# Patient Record
Sex: Female | Born: 1960 | Race: White | Hispanic: No | Smoking: Former smoker
Health system: Southern US, Community
[De-identification: ages and names within clinical notes are randomized; demographics above are authoritative.]

## PROBLEM LIST (undated history)

## (undated) DIAGNOSIS — G709 Myoneural disorder, unspecified: Secondary | ICD-10-CM

## (undated) DIAGNOSIS — M51369 Other intervertebral disc degeneration, lumbar region without mention of lumbar back pain or lower extremity pain: Secondary | ICD-10-CM

## (undated) DIAGNOSIS — T7840XA Allergy, unspecified, initial encounter: Secondary | ICD-10-CM

## (undated) DIAGNOSIS — E079 Disorder of thyroid, unspecified: Secondary | ICD-10-CM

## (undated) DIAGNOSIS — F1910 Other psychoactive substance abuse, uncomplicated: Secondary | ICD-10-CM

## (undated) DIAGNOSIS — G5600 Carpal tunnel syndrome, unspecified upper limb: Secondary | ICD-10-CM

## (undated) DIAGNOSIS — D259 Leiomyoma of uterus, unspecified: Secondary | ICD-10-CM

## (undated) DIAGNOSIS — F329 Major depressive disorder, single episode, unspecified: Secondary | ICD-10-CM

## (undated) DIAGNOSIS — T148XXA Other injury of unspecified body region, initial encounter: Secondary | ICD-10-CM

## (undated) DIAGNOSIS — E785 Hyperlipidemia, unspecified: Secondary | ICD-10-CM

## (undated) DIAGNOSIS — M5136 Other intervertebral disc degeneration, lumbar region: Secondary | ICD-10-CM

## (undated) DIAGNOSIS — F419 Anxiety disorder, unspecified: Secondary | ICD-10-CM

## (undated) DIAGNOSIS — M1990 Unspecified osteoarthritis, unspecified site: Secondary | ICD-10-CM

## (undated) DIAGNOSIS — F32A Depression, unspecified: Secondary | ICD-10-CM

## (undated) DIAGNOSIS — M797 Fibromyalgia: Secondary | ICD-10-CM

## (undated) DIAGNOSIS — J45909 Unspecified asthma, uncomplicated: Secondary | ICD-10-CM

## (undated) HISTORY — DX: Allergy, unspecified, initial encounter: T78.40XA

## (undated) HISTORY — DX: Disorder of thyroid, unspecified: E07.9

## (undated) HISTORY — DX: Other intervertebral disc degeneration, lumbar region without mention of lumbar back pain or lower extremity pain: M51.369

## (undated) HISTORY — DX: Other psychoactive substance abuse, uncomplicated: F19.10

## (undated) HISTORY — PX: TONSILLECTOMY: SUR1361

## (undated) HISTORY — DX: Unspecified osteoarthritis, unspecified site: M19.90

## (undated) HISTORY — PX: ANTERIOR CERVICAL DECOMP/DISCECTOMY FUSION: SHX1161

## (undated) HISTORY — DX: Other injury of unspecified body region, initial encounter: T14.8XXA

## (undated) HISTORY — DX: Major depressive disorder, single episode, unspecified: F32.9

## (undated) HISTORY — DX: Anxiety disorder, unspecified: F41.9

## (undated) HISTORY — DX: Depression, unspecified: F32.A

## (undated) HISTORY — DX: Myoneural disorder, unspecified: G70.9

## (undated) HISTORY — DX: Carpal tunnel syndrome, unspecified upper limb: G56.00

## (undated) HISTORY — DX: Hyperlipidemia, unspecified: E78.5

## (undated) HISTORY — DX: Fibromyalgia: M79.7

## (undated) HISTORY — DX: Unspecified asthma, uncomplicated: J45.909

## (undated) HISTORY — DX: Other intervertebral disc degeneration, lumbar region: M51.36

## (undated) HISTORY — DX: Leiomyoma of uterus, unspecified: D25.9

## (undated) HISTORY — PX: SHOULDER ARTHROSCOPY WITH ROTATOR CUFF REPAIR AND SUBACROMIAL DECOMPRESSION: SHX5686

---

## 1999-06-25 ENCOUNTER — Encounter: Admission: RE | Admit: 1999-06-25 | Discharge: 1999-06-25 | Payer: Self-pay | Admitting: Internal Medicine

## 2000-04-11 ENCOUNTER — Encounter: Admission: RE | Admit: 2000-04-11 | Discharge: 2000-05-27 | Payer: Self-pay | Admitting: Anesthesiology

## 2000-10-04 ENCOUNTER — Encounter: Payer: Self-pay | Admitting: Neurosurgery

## 2000-10-04 ENCOUNTER — Ambulatory Visit (HOSPITAL_COMMUNITY): Admission: RE | Admit: 2000-10-04 | Discharge: 2000-10-04 | Payer: Self-pay | Admitting: Neurosurgery

## 2000-10-20 ENCOUNTER — Encounter: Admission: RE | Admit: 2000-10-20 | Discharge: 2001-01-18 | Payer: Self-pay | Admitting: Anesthesiology

## 2001-02-03 ENCOUNTER — Encounter: Payer: Self-pay | Admitting: Family Medicine

## 2001-02-03 ENCOUNTER — Encounter: Admission: RE | Admit: 2001-02-03 | Discharge: 2001-02-03 | Payer: Self-pay | Admitting: Family Medicine

## 2001-11-02 ENCOUNTER — Other Ambulatory Visit: Admission: RE | Admit: 2001-11-02 | Discharge: 2001-11-02 | Payer: Self-pay | Admitting: Obstetrics and Gynecology

## 2002-11-10 ENCOUNTER — Emergency Department (HOSPITAL_COMMUNITY): Admission: EM | Admit: 2002-11-10 | Discharge: 2002-11-10 | Payer: Self-pay | Admitting: Internal Medicine

## 2003-01-02 ENCOUNTER — Encounter: Payer: Self-pay | Admitting: Neurosurgery

## 2003-01-04 ENCOUNTER — Encounter: Payer: Self-pay | Admitting: Neurosurgery

## 2003-01-04 ENCOUNTER — Inpatient Hospital Stay (HOSPITAL_COMMUNITY): Admission: RE | Admit: 2003-01-04 | Discharge: 2003-01-07 | Payer: Self-pay | Admitting: Neurosurgery

## 2003-01-23 ENCOUNTER — Encounter (HOSPITAL_COMMUNITY): Admission: RE | Admit: 2003-01-23 | Discharge: 2003-02-22 | Payer: Self-pay | Admitting: Neurosurgery

## 2003-02-21 ENCOUNTER — Other Ambulatory Visit: Admission: RE | Admit: 2003-02-21 | Discharge: 2003-02-21 | Payer: Self-pay | Admitting: Gynecology

## 2003-02-21 ENCOUNTER — Encounter: Payer: Self-pay | Admitting: Family Medicine

## 2003-02-21 ENCOUNTER — Encounter: Admission: RE | Admit: 2003-02-21 | Discharge: 2003-02-21 | Payer: Self-pay | Admitting: Family Medicine

## 2003-02-25 ENCOUNTER — Encounter: Admission: RE | Admit: 2003-02-25 | Discharge: 2003-02-25 | Payer: Self-pay | Admitting: Neurosurgery

## 2003-02-25 ENCOUNTER — Encounter: Payer: Self-pay | Admitting: Neurosurgery

## 2003-04-15 ENCOUNTER — Encounter: Payer: Self-pay | Admitting: Physical Medicine and Rehabilitation

## 2003-04-15 ENCOUNTER — Ambulatory Visit (HOSPITAL_COMMUNITY)
Admission: RE | Admit: 2003-04-15 | Discharge: 2003-04-15 | Payer: Self-pay | Admitting: Physical Medicine and Rehabilitation

## 2003-04-29 ENCOUNTER — Encounter: Payer: Self-pay | Admitting: Neurosurgery

## 2003-04-29 ENCOUNTER — Encounter: Admission: RE | Admit: 2003-04-29 | Discharge: 2003-04-29 | Payer: Self-pay | Admitting: Neurosurgery

## 2003-08-20 ENCOUNTER — Encounter: Admission: RE | Admit: 2003-08-20 | Discharge: 2003-08-20 | Payer: Self-pay | Admitting: Neurosurgery

## 2004-02-03 ENCOUNTER — Encounter: Admission: RE | Admit: 2004-02-03 | Discharge: 2004-02-03 | Payer: Self-pay | Admitting: Neurosurgery

## 2004-02-24 ENCOUNTER — Other Ambulatory Visit: Admission: RE | Admit: 2004-02-24 | Discharge: 2004-02-24 | Payer: Self-pay | Admitting: Gynecology

## 2004-05-12 ENCOUNTER — Encounter: Admission: RE | Admit: 2004-05-12 | Discharge: 2004-05-12 | Payer: Self-pay | Admitting: Neurosurgery

## 2004-09-11 ENCOUNTER — Encounter: Admission: RE | Admit: 2004-09-11 | Discharge: 2004-09-11 | Payer: Self-pay | Admitting: Family Medicine

## 2004-09-16 ENCOUNTER — Encounter: Admission: RE | Admit: 2004-09-16 | Discharge: 2004-09-16 | Payer: Self-pay | Admitting: Gynecology

## 2005-05-20 ENCOUNTER — Other Ambulatory Visit: Admission: RE | Admit: 2005-05-20 | Discharge: 2005-05-20 | Payer: Self-pay | Admitting: Gynecology

## 2005-12-19 ENCOUNTER — Encounter (INDEPENDENT_AMBULATORY_CARE_PROVIDER_SITE_OTHER): Payer: Self-pay | Admitting: Family Medicine

## 2005-12-20 ENCOUNTER — Ambulatory Visit: Payer: Self-pay | Admitting: Family Medicine

## 2006-02-15 ENCOUNTER — Ambulatory Visit: Payer: Self-pay | Admitting: Family Medicine

## 2006-02-18 ENCOUNTER — Ambulatory Visit: Payer: Self-pay | Admitting: Family Medicine

## 2006-04-06 ENCOUNTER — Ambulatory Visit: Payer: Self-pay | Admitting: Family Medicine

## 2006-04-06 ENCOUNTER — Encounter (INDEPENDENT_AMBULATORY_CARE_PROVIDER_SITE_OTHER): Payer: Self-pay | Admitting: Family Medicine

## 2006-10-17 ENCOUNTER — Encounter: Payer: Self-pay | Admitting: Family Medicine

## 2006-10-17 DIAGNOSIS — D259 Leiomyoma of uterus, unspecified: Secondary | ICD-10-CM

## 2006-10-17 DIAGNOSIS — IMO0001 Reserved for inherently not codable concepts without codable children: Secondary | ICD-10-CM | POA: Insufficient documentation

## 2006-10-17 DIAGNOSIS — K5900 Constipation, unspecified: Secondary | ICD-10-CM | POA: Insufficient documentation

## 2006-10-17 DIAGNOSIS — F411 Generalized anxiety disorder: Secondary | ICD-10-CM | POA: Insufficient documentation

## 2006-10-17 DIAGNOSIS — E785 Hyperlipidemia, unspecified: Secondary | ICD-10-CM | POA: Insufficient documentation

## 2006-10-17 DIAGNOSIS — M545 Low back pain, unspecified: Secondary | ICD-10-CM | POA: Insufficient documentation

## 2006-10-17 DIAGNOSIS — F329 Major depressive disorder, single episode, unspecified: Secondary | ICD-10-CM | POA: Insufficient documentation

## 2006-10-17 HISTORY — DX: Leiomyoma of uterus, unspecified: D25.9

## 2006-11-01 ENCOUNTER — Ambulatory Visit: Payer: Self-pay | Admitting: Family Medicine

## 2006-11-01 ENCOUNTER — Other Ambulatory Visit: Admission: RE | Admit: 2006-11-01 | Discharge: 2006-11-01 | Payer: Self-pay | Admitting: Family Medicine

## 2006-11-01 ENCOUNTER — Encounter (INDEPENDENT_AMBULATORY_CARE_PROVIDER_SITE_OTHER): Payer: Self-pay | Admitting: Family Medicine

## 2006-11-01 DIAGNOSIS — E669 Obesity, unspecified: Secondary | ICD-10-CM | POA: Insufficient documentation

## 2006-11-03 ENCOUNTER — Encounter (INDEPENDENT_AMBULATORY_CARE_PROVIDER_SITE_OTHER): Payer: Self-pay | Admitting: Family Medicine

## 2006-11-06 LAB — CONVERTED CEMR LAB
ALT: 31 units/L (ref 0–35)
AST: 22 units/L (ref 0–37)
Albumin: 4 g/dL (ref 3.5–5.2)
Alkaline Phosphatase: 78 units/L (ref 39–117)
BUN: 16 mg/dL (ref 6–23)
Basophils Absolute: 0 10*3/uL (ref 0.0–0.1)
Basophils Relative: 1 % (ref 0–1)
CO2: 26 meq/L (ref 19–32)
Calcium: 8.8 mg/dL (ref 8.4–10.5)
Chloride: 104 meq/L (ref 96–112)
Cholesterol: 196 mg/dL (ref 0–200)
Creatinine, Ser: 0.76 mg/dL (ref 0.40–1.20)
Eosinophils Absolute: 0.2 10*3/uL (ref 0.0–0.7)
Eosinophils Relative: 4 % (ref 0–5)
Glucose, Bld: 89 mg/dL (ref 70–99)
HCT: 44 % (ref 36.0–46.0)
HDL: 32 mg/dL — ABNORMAL LOW (ref >39)
Hemoglobin: 14 g/dL (ref 12.0–15.0)
LDL Cholesterol: 128 mg/dL — ABNORMAL HIGH (ref 0–99)
Lymphocytes Relative: 37 % (ref 12–46)
Lymphs Abs: 2.1 10*3/uL (ref 0.7–3.3)
MCHC: 31.8 g/dL (ref 30.0–36.0)
MCV: 93 fL (ref 78.0–100.0)
Monocytes Absolute: 0.4 10*3/uL (ref 0.2–0.7)
Monocytes Relative: 6 % (ref 3–11)
Neutro Abs: 3 10*3/uL (ref 1.7–7.7)
Neutrophils Relative %: 53 % (ref 43–77)
Platelets: 250 10*3/uL (ref 150–400)
Potassium: 4.6 meq/L (ref 3.5–5.3)
RBC: 4.73 M/uL (ref 3.87–5.11)
RDW: 12.8 % (ref 11.5–14.0)
Sodium: 139 meq/L (ref 135–145)
TSH: 7.553 microintl units/mL — ABNORMAL HIGH (ref 0.350–5.50)
Total Bilirubin: 0.4 mg/dL (ref 0.3–1.2)
Total CHOL/HDL Ratio: 6.1
Total Protein: 6.5 g/dL (ref 6.0–8.3)
Triglycerides: 181 mg/dL — ABNORMAL HIGH (ref <150)
VLDL: 36 mg/dL (ref 0–40)
WBC: 5.7 10*3/uL (ref 4.0–10.5)

## 2006-11-18 ENCOUNTER — Encounter: Admission: RE | Admit: 2006-11-18 | Discharge: 2006-11-18 | Payer: Self-pay | Admitting: Family Medicine

## 2006-12-01 ENCOUNTER — Ambulatory Visit: Payer: Self-pay | Admitting: Family Medicine

## 2006-12-01 LAB — CONVERTED CEMR LAB
Cholesterol, target level: 200 mg/dL
HDL goal, serum: 40 mg/dL
LDL Goal: 160 mg/dL

## 2006-12-02 ENCOUNTER — Telehealth (INDEPENDENT_AMBULATORY_CARE_PROVIDER_SITE_OTHER): Payer: Self-pay | Admitting: Family Medicine

## 2006-12-02 ENCOUNTER — Encounter (INDEPENDENT_AMBULATORY_CARE_PROVIDER_SITE_OTHER): Payer: Self-pay | Admitting: Family Medicine

## 2006-12-05 LAB — CONVERTED CEMR LAB: TSH: 4.465 microintl units/mL (ref 0.350–5.50)

## 2007-01-26 ENCOUNTER — Ambulatory Visit: Payer: Self-pay | Admitting: Family Medicine

## 2007-01-26 DIAGNOSIS — F17200 Nicotine dependence, unspecified, uncomplicated: Secondary | ICD-10-CM | POA: Insufficient documentation

## 2007-01-31 ENCOUNTER — Telehealth (INDEPENDENT_AMBULATORY_CARE_PROVIDER_SITE_OTHER): Payer: Self-pay | Admitting: Family Medicine

## 2007-03-13 ENCOUNTER — Ambulatory Visit: Payer: Self-pay | Admitting: Family Medicine

## 2007-03-22 ENCOUNTER — Telehealth (INDEPENDENT_AMBULATORY_CARE_PROVIDER_SITE_OTHER): Payer: Self-pay | Admitting: Family Medicine

## 2007-04-11 ENCOUNTER — Telehealth (INDEPENDENT_AMBULATORY_CARE_PROVIDER_SITE_OTHER): Payer: Self-pay | Admitting: *Deleted

## 2007-04-11 ENCOUNTER — Ambulatory Visit (HOSPITAL_COMMUNITY): Admission: RE | Admit: 2007-04-11 | Discharge: 2007-04-11 | Payer: Self-pay | Admitting: Family Medicine

## 2007-04-11 ENCOUNTER — Encounter (INDEPENDENT_AMBULATORY_CARE_PROVIDER_SITE_OTHER): Payer: Self-pay | Admitting: Family Medicine

## 2007-04-12 ENCOUNTER — Telehealth (INDEPENDENT_AMBULATORY_CARE_PROVIDER_SITE_OTHER): Payer: Self-pay | Admitting: *Deleted

## 2007-04-12 ENCOUNTER — Encounter (INDEPENDENT_AMBULATORY_CARE_PROVIDER_SITE_OTHER): Payer: Self-pay | Admitting: Family Medicine

## 2007-04-12 LAB — CONVERTED CEMR LAB
Basophils Absolute: 0 10*3/uL (ref 0.0–0.1)
Basophils Relative: 1 % (ref 0–1)
Eosinophils Absolute: 0.3 10*3/uL (ref 0.0–0.7)
Eosinophils Relative: 5 % (ref 0–5)
FSH: 4.3 milliintl units/mL
HCT: 42.3 % (ref 36.0–46.0)
Hemoglobin: 13.6 g/dL (ref 12.0–15.0)
LH: 0.3 milliintl units/mL
Lymphocytes Relative: 47 % — ABNORMAL HIGH (ref 12–46)
Lymphs Abs: 2.7 10*3/uL (ref 0.7–3.3)
MCHC: 32.2 g/dL (ref 30.0–36.0)
MCV: 91.4 fL (ref 78.0–100.0)
Monocytes Absolute: 0.4 10*3/uL (ref 0.2–0.7)
Monocytes Relative: 7 % (ref 3–11)
Neutro Abs: 2.4 10*3/uL (ref 1.7–7.7)
Neutrophils Relative %: 42 % — ABNORMAL LOW (ref 43–77)
Platelets: 258 10*3/uL (ref 150–400)
RBC: 4.63 M/uL (ref 3.87–5.11)
RDW: 13.9 % (ref 11.5–14.0)
TSH: 8.306 microintl units/mL — ABNORMAL HIGH (ref 0.350–5.50)
WBC: 5.7 10*3/uL (ref 4.0–10.5)

## 2007-06-06 ENCOUNTER — Ambulatory Visit: Payer: Self-pay | Admitting: Family Medicine

## 2007-06-07 ENCOUNTER — Telehealth (INDEPENDENT_AMBULATORY_CARE_PROVIDER_SITE_OTHER): Payer: Self-pay | Admitting: *Deleted

## 2007-06-26 ENCOUNTER — Telehealth (INDEPENDENT_AMBULATORY_CARE_PROVIDER_SITE_OTHER): Payer: Self-pay | Admitting: *Deleted

## 2007-06-27 ENCOUNTER — Encounter (INDEPENDENT_AMBULATORY_CARE_PROVIDER_SITE_OTHER): Payer: Self-pay | Admitting: Family Medicine

## 2007-07-03 ENCOUNTER — Encounter (INDEPENDENT_AMBULATORY_CARE_PROVIDER_SITE_OTHER): Payer: Self-pay | Admitting: Family Medicine

## 2007-07-04 ENCOUNTER — Encounter (INDEPENDENT_AMBULATORY_CARE_PROVIDER_SITE_OTHER): Payer: Self-pay | Admitting: Family Medicine

## 2007-07-20 ENCOUNTER — Ambulatory Visit: Payer: Self-pay | Admitting: Family Medicine

## 2007-07-20 DIAGNOSIS — E039 Hypothyroidism, unspecified: Secondary | ICD-10-CM | POA: Insufficient documentation

## 2007-07-21 ENCOUNTER — Telehealth (INDEPENDENT_AMBULATORY_CARE_PROVIDER_SITE_OTHER): Payer: Self-pay | Admitting: *Deleted

## 2007-07-21 LAB — CONVERTED CEMR LAB: TSH: 2.04 microintl units/mL (ref 0.350–5.50)

## 2007-08-24 ENCOUNTER — Ambulatory Visit: Payer: Self-pay | Admitting: Family Medicine

## 2007-08-24 ENCOUNTER — Telehealth (INDEPENDENT_AMBULATORY_CARE_PROVIDER_SITE_OTHER): Payer: Self-pay | Admitting: *Deleted

## 2007-08-24 DIAGNOSIS — J45909 Unspecified asthma, uncomplicated: Secondary | ICD-10-CM | POA: Insufficient documentation

## 2007-08-25 ENCOUNTER — Telehealth (INDEPENDENT_AMBULATORY_CARE_PROVIDER_SITE_OTHER): Payer: Self-pay | Admitting: *Deleted

## 2007-08-28 ENCOUNTER — Ambulatory Visit: Payer: Self-pay | Admitting: Family Medicine

## 2007-08-28 ENCOUNTER — Ambulatory Visit (HOSPITAL_COMMUNITY): Admission: RE | Admit: 2007-08-28 | Discharge: 2007-08-28 | Payer: Self-pay | Admitting: Family Medicine

## 2007-08-28 ENCOUNTER — Telehealth (INDEPENDENT_AMBULATORY_CARE_PROVIDER_SITE_OTHER): Payer: Self-pay | Admitting: *Deleted

## 2007-08-28 LAB — CONVERTED CEMR LAB
Basophils Absolute: 0 10*3/uL (ref 0.0–0.1)
Basophils Relative: 0 % (ref 0–1)
Eosinophils Absolute: 0.2 10*3/uL (ref 0.2–0.7)
Eosinophils Relative: 3 % (ref 0–5)
HCT: 42 % (ref 36.0–46.0)
Hemoglobin: 14 g/dL (ref 12.0–15.0)
Lymphocytes Relative: 30 % (ref 12–46)
Lymphs Abs: 2 10*3/uL (ref 0.7–4.0)
MCHC: 33.3 g/dL (ref 30.0–36.0)
MCV: 92.1 fL (ref 78.0–100.0)
Monocytes Absolute: 0.4 10*3/uL (ref 0.1–1.0)
Monocytes Relative: 6 % (ref 3–12)
Neutro Abs: 4 10*3/uL (ref 1.7–7.7)
Neutrophils Relative %: 59 % (ref 43–77)
Platelets: 245 10*3/uL (ref 150–400)
RBC: 4.56 M/uL (ref 3.87–5.11)
RDW: 14 % (ref 11.5–15.5)
WBC: 6.7 10*3/uL (ref 4.0–10.5)

## 2007-08-29 ENCOUNTER — Telehealth (INDEPENDENT_AMBULATORY_CARE_PROVIDER_SITE_OTHER): Payer: Self-pay | Admitting: Family Medicine

## 2007-09-01 ENCOUNTER — Ambulatory Visit: Payer: Self-pay | Admitting: Family Medicine

## 2007-10-09 ENCOUNTER — Encounter (INDEPENDENT_AMBULATORY_CARE_PROVIDER_SITE_OTHER): Payer: Self-pay | Admitting: Family Medicine

## 2008-05-09 ENCOUNTER — Ambulatory Visit: Payer: Self-pay | Admitting: Family Medicine

## 2008-05-09 ENCOUNTER — Other Ambulatory Visit: Admission: RE | Admit: 2008-05-09 | Discharge: 2008-05-09 | Payer: Self-pay | Admitting: Family Medicine

## 2008-05-09 ENCOUNTER — Encounter (INDEPENDENT_AMBULATORY_CARE_PROVIDER_SITE_OTHER): Payer: Self-pay | Admitting: Family Medicine

## 2008-05-09 DIAGNOSIS — N920 Excessive and frequent menstruation with regular cycle: Secondary | ICD-10-CM | POA: Insufficient documentation

## 2008-05-10 ENCOUNTER — Ambulatory Visit (HOSPITAL_COMMUNITY): Admission: RE | Admit: 2008-05-10 | Discharge: 2008-05-10 | Payer: Self-pay | Admitting: Family Medicine

## 2008-05-10 ENCOUNTER — Encounter (INDEPENDENT_AMBULATORY_CARE_PROVIDER_SITE_OTHER): Payer: Self-pay | Admitting: Family Medicine

## 2008-05-10 LAB — CONVERTED CEMR LAB: Pap Smear: NORMAL

## 2008-05-14 LAB — CONVERTED CEMR LAB
ALT: 17 units/L (ref 0–35)
AST: 16 units/L (ref 0–37)
Albumin: 4.3 g/dL (ref 3.5–5.2)
Alkaline Phosphatase: 67 units/L (ref 39–117)
BUN: 16 mg/dL (ref 6–23)
CO2: 23 meq/L (ref 19–32)
Calcium: 9.1 mg/dL (ref 8.4–10.5)
Candida species: NEGATIVE
Chloride: 106 meq/L (ref 96–112)
Cholesterol: 185 mg/dL (ref 0–200)
Creatinine, Ser: 0.86 mg/dL (ref 0.40–1.20)
Gardnerella vaginalis: NEGATIVE
Glucose, Bld: 94 mg/dL (ref 70–99)
HCT: 39.7 % (ref 39.0–52.0)
HDL: 34 mg/dL — ABNORMAL LOW (ref >39)
Hemoglobin: 13.3 g/dL (ref 13.0–17.0)
LDL Cholesterol: 129 mg/dL — ABNORMAL HIGH (ref 0–99)
MCHC: 33.5 g/dL (ref 30.0–36.0)
MCV: 91.5 fL (ref 78.0–100.0)
Platelets: 257 10*3/uL (ref 150–400)
Potassium: 4.3 meq/L (ref 3.5–5.3)
RBC: 4.34 M/uL (ref 4.22–5.81)
RDW: 12.7 % (ref 11.5–15.5)
Sodium: 140 meq/L (ref 135–145)
TSH: 16.774 microintl units/mL — ABNORMAL HIGH (ref 0.350–4.50)
Total Bilirubin: 0.6 mg/dL (ref 0.3–1.2)
Total CHOL/HDL Ratio: 5.4
Total Protein: 7 g/dL (ref 6.0–8.3)
Trichomonal Vaginitis: NEGATIVE
Triglycerides: 109 mg/dL (ref <150)
VLDL: 22 mg/dL (ref 0–40)
WBC: 6.5 10*3/uL (ref 4.0–10.5)

## 2008-07-18 ENCOUNTER — Ambulatory Visit: Payer: Self-pay | Admitting: Gynecology

## 2008-07-18 ENCOUNTER — Encounter (INDEPENDENT_AMBULATORY_CARE_PROVIDER_SITE_OTHER): Payer: Self-pay | Admitting: Family Medicine

## 2008-07-25 ENCOUNTER — Ambulatory Visit: Payer: Self-pay | Admitting: Gynecology

## 2008-07-25 ENCOUNTER — Encounter (INDEPENDENT_AMBULATORY_CARE_PROVIDER_SITE_OTHER): Payer: Self-pay | Admitting: Family Medicine

## 2008-10-25 ENCOUNTER — Ambulatory Visit: Payer: Self-pay | Admitting: Family Medicine

## 2008-10-25 DIAGNOSIS — K2970 Gastritis, unspecified, without bleeding: Secondary | ICD-10-CM | POA: Insufficient documentation

## 2008-10-25 DIAGNOSIS — R5383 Other fatigue: Secondary | ICD-10-CM | POA: Insufficient documentation

## 2008-10-25 DIAGNOSIS — K2990 Gastroduodenitis, unspecified, without bleeding: Secondary | ICD-10-CM | POA: Insufficient documentation

## 2008-10-25 DIAGNOSIS — R5381 Other malaise: Secondary | ICD-10-CM | POA: Insufficient documentation

## 2008-10-25 LAB — CONVERTED CEMR LAB
Glucose, Bld: 104 mg/dL
Hemoglobin: 15.3 g/dL
LDL Goal: 130 mg/dL

## 2008-10-26 ENCOUNTER — Encounter (INDEPENDENT_AMBULATORY_CARE_PROVIDER_SITE_OTHER): Payer: Self-pay | Admitting: Family Medicine

## 2008-10-28 LAB — CONVERTED CEMR LAB: TSH: 4.376 microintl units/mL (ref 0.350–4.50)

## 2008-10-29 ENCOUNTER — Ambulatory Visit: Payer: Self-pay | Admitting: Family Medicine

## 2008-10-30 ENCOUNTER — Encounter (INDEPENDENT_AMBULATORY_CARE_PROVIDER_SITE_OTHER): Payer: Self-pay | Admitting: Family Medicine

## 2009-03-26 ENCOUNTER — Ambulatory Visit: Payer: Self-pay | Admitting: Family Medicine

## 2009-03-26 DIAGNOSIS — J45901 Unspecified asthma with (acute) exacerbation: Secondary | ICD-10-CM | POA: Insufficient documentation

## 2009-03-28 LAB — CONVERTED CEMR LAB
Basophils Absolute: 0 10*3/uL (ref 0.0–0.1)
Basophils Relative: 0 % (ref 0–1)
Eosinophils Absolute: 0.2 10*3/uL (ref 0.0–0.7)
Eosinophils Relative: 3 % (ref 0–5)
HCT: 37.5 % (ref 36.0–46.0)
Hemoglobin: 12.4 g/dL (ref 12.0–15.0)
Lymphocytes Relative: 26 % (ref 12–46)
Lymphs Abs: 2 10*3/uL (ref 0.7–4.0)
MCHC: 33.1 g/dL (ref 30.0–36.0)
MCV: 91 fL (ref 78.0–100.0)
Monocytes Absolute: 0.4 10*3/uL (ref 0.1–1.0)
Monocytes Relative: 5 % (ref 3–12)
Neutro Abs: 5.2 10*3/uL (ref 1.7–7.7)
Neutrophils Relative %: 67 % (ref 43–77)
Platelets: 232 10*3/uL (ref 150–400)
RBC: 4.12 M/uL (ref 3.87–5.11)
RDW: 12.5 % (ref 11.5–15.5)
TSH: 1.655 microintl units/mL (ref 0.350–4.500)
WBC: 7.8 10*3/uL (ref 4.0–10.5)

## 2009-04-08 ENCOUNTER — Ambulatory Visit: Payer: Self-pay | Admitting: Family Medicine

## 2009-04-08 ENCOUNTER — Ambulatory Visit (HOSPITAL_COMMUNITY): Admission: RE | Admit: 2009-04-08 | Discharge: 2009-04-08 | Payer: Self-pay | Admitting: Family Medicine

## 2009-04-22 ENCOUNTER — Telehealth (INDEPENDENT_AMBULATORY_CARE_PROVIDER_SITE_OTHER): Payer: Self-pay | Admitting: *Deleted

## 2009-04-23 ENCOUNTER — Telehealth (INDEPENDENT_AMBULATORY_CARE_PROVIDER_SITE_OTHER): Payer: Self-pay | Admitting: *Deleted

## 2009-05-05 ENCOUNTER — Ambulatory Visit: Payer: Self-pay | Admitting: Family Medicine

## 2009-05-22 ENCOUNTER — Encounter (INDEPENDENT_AMBULATORY_CARE_PROVIDER_SITE_OTHER): Payer: Self-pay | Admitting: Family Medicine

## 2009-05-22 ENCOUNTER — Other Ambulatory Visit: Admission: RE | Admit: 2009-05-22 | Discharge: 2009-05-22 | Payer: Self-pay | Admitting: Family Medicine

## 2009-05-22 ENCOUNTER — Ambulatory Visit: Payer: Self-pay | Admitting: Family Medicine

## 2009-05-22 DIAGNOSIS — N760 Acute vaginitis: Secondary | ICD-10-CM | POA: Insufficient documentation

## 2009-05-22 DIAGNOSIS — B009 Herpesviral infection, unspecified: Secondary | ICD-10-CM | POA: Insufficient documentation

## 2009-05-23 ENCOUNTER — Encounter (INDEPENDENT_AMBULATORY_CARE_PROVIDER_SITE_OTHER): Payer: Self-pay | Admitting: Family Medicine

## 2009-05-23 LAB — CONVERTED CEMR LAB
Candida species: NEGATIVE
Gardnerella vaginalis: NEGATIVE

## 2009-09-16 ENCOUNTER — Encounter: Payer: Self-pay | Admitting: Family Medicine

## 2010-10-05 ENCOUNTER — Encounter: Payer: Self-pay | Admitting: Family Medicine

## 2010-10-15 NOTE — Progress Notes (Signed)
Summary: productive cough  Phone Note Call from Patient   Caller: Patient Summary of Call: Wanting antibotic, states she is starting to cough up yellow-greenish material now. Advised that normally we do not call in antibotics Initial call taken by: Sonny Dandy,  August 25, 2007 9:48 AM  Follow-up for Phone Call        May call in Zpak. Hx of smoking and asthma. Possibly underlying bronchitis. See note from yesterday for details. Follow-up by: Franchot Heidelberg MD,  August 25, 2007 9:59 AM  Additional Follow-up for Phone Call Additional follow up Details #1::        message left for patient to return call .................................................................Marland KitchenMarland KitchenSonny Dandy  August 25, 2007 10:17 AM   patient called and info given, script called to [facility] .................................................................Marland KitchenMarland KitchenSonny Dandy  August 25, 2007 11:59 AM  Additional Follow-up by: Sonny Dandy,  August 25, 2007 11:59 AM

## 2010-10-15 NOTE — Progress Notes (Signed)
Summary: 08/28/07 lab/xray resuts  Phone Note Outgoing Call   Call placed by: Sonny Dandy,  August 28, 2007 11:24 AM Summary of Call: Results given to patient, voices understanding .................................................................Marland KitchenMarland KitchenSonny Dandy  August 28, 2007 11:24 AM   Initial call taken by: Sonny Dandy,  August 28, 2007 11:24 AM

## 2010-10-15 NOTE — Assessment & Plan Note (Signed)
Summary: work in not doing well with medication/slj   Vital Signs:  Patient profile:   50 year old female Height:      67 inches Weight:      223 pounds BMI:     35.05 O2 Sat:      99 % Temp:     97.8 degrees F Pulse rate:   87 / minute Resp:     14 per minute BP sitting:   133 / 87  Vitals Entered By: Sherilyn Banker LPN (May 05, 2009 2:12 PM) CC: follow-up visit   Primary Provider:  Franchot Heidelberg, MD  CC:  follow-up visit.  History of Present Illness: Pt in for recheck.  She had recent asthma flare. States bad air quality and pollen has not helped. She had CXR and this is reviewed: normal. She denies cough and wheeze and notes no SOB, Orthopnea, PND or palpitations. She deneis fevr, chills and sweats.  States allergy not bothering now.  She states main concern today is depression. SHe notes the Venlafxine is not doing the trick. She states makes her nauseated and she has vomitted. She states helped initally but not now. She is sleeping alright. She is very irritable and anxious. States little thinbgs just will tick her off - adds nothing really going on and she just does not feel good. She has poor concentration. Energy is low. She denies sucidial ideations but notes has thought of  going to beach and just getting stoned and never coming back. She has gfood support in her significant other. Adds period is going on and it makes it even worse. She did review old records from Dr. Lafayette Dragon and she was on Zoloft and Wellbutrin  - 100 and 300 at end dose. She is curious about switch. She states trying to see Dr. Lolly Mustache and has not heard from them yet - had to sign relases and has not heard back yet. States did one week ago.  She wants to go back on Rx as above and stop the Venlafaxine.  She now presents.  Current Problems (verified): 1)  Asthma, With Acute Exacerbation  (ICD-493.92) 2)  Gastritis  (ICD-535.50) 3)  Malaise and Fatigue  (ICD-780.79) 4)  Menorrhagia  (ICD-626.2) 5)   Asthma, Mild  (ICD-493.90) 6)  Hypothyroidism  (ICD-244.9) 7)  Hx of Tobacco Abuse  (ICD-305.1) 8)  Obesity Nos  (ICD-278.00) 9)  Follow-up, High Risk Treatment Nec  (ICD-V67.51) 10)  Fibroids, Uterus  (ICD-218.9) 11)  Fibromyalgia  (ICD-729.1) 12)  Constipation Nos  (ICD-564.00) 13)  Low Back Pain  (ICD-724.2) 14)  Hyperlipidemia  (ICD-272.4) 15)  Depression  (ICD-311) 16)  Anxiety  (ICD-300.00)  Current Medications (verified): 1)  Multivitamins  Tabs (Multiple Vitamin) .... One Daily 2)  Kadian 50 Mg Xr24h-Cap (Morphine Sulfate) .... Two Times A Day 3)  Synthroid 50 Mcg Tabs (Levothyroxine Sodium) .... Once Daily 4)  Neurontin 300 Mg Caps (Gabapentin) .... Two Times A Day 5)  Proventil Hfa 108 (90 Base) Mcg/act Aers (Albuterol Sulfate) .... One Inh Every 6 Hours If Needed 6)  Advair Diskus 250-50 Mcg/dose Misc (Fluticasone-Salmeterol) .... One Inh Two Times A Day 7)  Wellbutrin Sr 100 Mg Xr12h-Tab (Bupropion Hcl) .... One Daily 8)  Sertraline Hcl 50 Mg Tabs (Sertraline Hcl) .... One Nightly  Allergies (verified): 1)  ! Morphine 2)  ! * Venlafaxine  Past History:  Past Medical History: Last updated: 05/09/2008 Current Problems:  ASTHMA, WITH ACUTE EXACERBATION (ICD-493.92) HYPOTHYROIDISM (ICD-244.9) DE QUERVAIN'S TENOSYNOVITIS,  RIGHT WRIST (ICD-727.04) CHEST XRAY, ABNORMAL (ICD-793.1) WRIST PAIN, RIGHT (ICD-719.43) ASTHMA (ICD-493.90) TOBACCO ABUSE (ICD-305.1) OBESITY NOS (ICD-278.00) FOLLOW-UP, HIGH RISK TREATMENT NEC (ICD-V67.51) FIBROIDS, UTERUS (ICD-218.9) FIBROMYALGIA (ICD-729.1) CONSTIPATION NOS (ICD-564.00) LOW BACK PAIN (ICD-724.2) HYPERLIPIDEMIA (ICD-272.4) DEPRESSION (ICD-311) ANXIETY (ICD-300.00)  Past Surgical History: Last updated: 11/01/2006 Tonsillectomy Back surgery - L4-L5 fusion  Family History: Last updated: 2009-04-11 Father: Dead age 47 ETOH abuse, DM, Bone Marrow Cancer Mother: Dead  at 42 when pt was 45 months old - Pneumonia Siblings:  Sisters x 2 - 55 dead ETOH abuse and liver cirrhosis and 65 DDD C-spine, Anxiety Brother 53 - ETOH abuse Kids - none  Social History: Last updated: 04/11/09 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 Tired appearing.  Lungs:  Decreased airation. No wheeze. Heart:  Normal rate and regular rhythm. S1 and S2 normal without gallop, murmur, click, rub or other extra sounds. Abdomen:  Bowel sounds positive,abdomen soft and non-tender without masses, organomegaly or hernias noted. Extremities:  No clubbing, cyanosis, edema, or deformity noted with normal full range of motion of all joints.   Cervical Nodes:  No lymphadenopathy noted Psych:  Cognition and judgment appear intact. Alert and cooperative with normal attention span and concentration. No apparent delusions, illusions, hallucinations   Impression & Recommendations:  Problem # 1:  ASTHMA, WITH ACUTE EXACERBATION (ICD-493.92) Resolved. Rx as below. Avoid all tobacco. Reassured neg CXR and happy with this. The following medications were removed from the medication list:    Medrol (pak) 4 Mg Tabs (Methylprednisolone) .[name]... As directed Her updated medication list for this problem includes:    Proventil Hfa 108 (90 Base) Mcg/act Aers (Albuterol sulfate) ..... One inh every 6 hours if needed    Advair Diskus 250-50 Mcg/dose Misc (Fluticasone-salmeterol) ..... One inh two times a day  Problem # 2:  DEPRESSION (ICD-311)  DC Venlafaxine. Start low dose Wellbutrin  and Zoloft as worked well in past. Councelled risk and benefit and advised immediate  update if suicidal ideations or worsening symtpoms. Edcuated ACT team at ED and advised how to use. Agreeable.  Nurse to chck with Psychiatry on appt and refer for councelling as well. Pt appreciative. Recheck 2 weeks - sooner if worse. The following medications were removed from the medication list:    Venlafaxine Hcl 75 Mg Xr24h-tab (Venlafaxine hcl) ..... One daily Her updated medication list for this problem includes:    Wellbutrin Sr 100 Mg Xr12h-tab (Bupropion hcl) ..... One daily    Sertraline Hcl 50 Mg Tabs (Sertraline hcl) ..... One nightly  Orders: Psychology Referral (Psychology)  Complete Medication List: 1)  Multivitamins Tabs (Multiple vitamin) .... One daily 2)  Kadian 50 Mg Xr24h-cap (Morphine sulfate) .... Two times a day 3)  Synthroid 50 Mcg Tabs (Levothyroxine sodium) .... Once daily 4)  Neurontin 300 Mg Caps (Gabapentin) .... Two times a day 5)  Proventil Hfa 108 (90 Base) Mcg/act Aers (Albuterol sulfate) .... One inh every 6 hours if needed 6)  Advair Diskus 250-50 Mcg/dose Misc (Fluticasone-salmeterol) .... One inh two times a day 7)  Wellbutrin Sr 100 Mg Xr12h-tab (Bupropion hcl) .... One daily 8)  Sertraline Hcl 50 Mg Tabs (  Sertraline hcl) .... One nightly  Patient Instructions: 1)  Please schedule a follow-up appointment in 2 weeks. Prescriptions: SERTRALINE HCL 50 MG TABS (SERTRALINE HCL) One nightly  #30 x 3   Entered and Authorized by:   Franchot Heidelberg MD   Signed by:   Franchot Heidelberg MD on 05/05/2009   Method used:   Electronically to        [facility]* (retail)       [address]       [place], [place]  [address]       Ph: [phone]       Fax: [phone]   RxID:   [id number] WELLBUTRIN SR 100 MG XR12H-TAB (BUPROPION HCL) One daily  #30 x 3   Entered and Authorized by:   Franchot Heidelberg MD   Signed by:   Franchot Heidelberg MD on 05/05/2009   Method used:   Electronically to        [facility]* (retail)       [address]       [place], [place]  [address]       Ph: [phone]       Fax: [phone]   RxID:   [id number]   Appended Document: work in not doing well with medication/slj Psychology referral faxed to [facility] 05-06-09 @1135   Appended Document: work in not doing well with medication/slj 05/06/09 psychology referral faxed to [facility].

## 2010-10-15 NOTE — Progress Notes (Signed)
Summary: 04/10/07 lab results  Phone Note Outgoing Call   Call placed by: Sonny Dandy,  April 11, 2007 4:54 PM Summary of Call: no answer, message left for patient to return call on answering machine  Initial call taken by: Sonny Dandy,  April 11, 2007 4:55 PM  Follow-up for Phone Call        called, no answer. Lab letter mailed Follow-up by: Sonny Dandy,  April 12, 2007 2:00 PM

## 2010-10-15 NOTE — Letter (Signed)
Summary: Chantix, Proventil  Chantix, Proventil   Imported By: Lutricia Horsfall LPN 16/06/9603 54:09:81  _____________________________________________________________________  External Attachment:    Type:   Image     Comment:   External Document

## 2010-10-15 NOTE — Assessment & Plan Note (Signed)
Summary: physical/arc   Vital Signs:  Patient Profile:   50 Years Old Female Height:     67 inches (170.18 cm) Weight:      229 pounds BMI:     36.00 O2 Sat:      98 % Pulse rate:   90 / minute Resp:     12 per minute BP sitting:   129 / 80  Vitals Entered By: Sherilyn Banker (May 09, 2008 2:46 PM)                 PCP:  Franchot Heidelberg, MD  Chief Complaint:  physical.  History of Present Illness: Pt in for recheck.  She needs her well woman exam.  She states she has not had breast pain, nipple retraction or discharge. She states menopouse has begun. Periods are irregular and she gets sweaty. She has been moody and female partner has complained. States on Klonopin per Dr. Lafayette Dragon and doing weel. She does not have family hx of breast cancer and she notes no personal hx of breast cancer. States   She notes a hx of small fibroid 5 five years ago. She states her periods are changing and she is having more uterine cramps going to back. States gets more often and sometimes middle of month. She rates her pain as 6/10 and notes annoying. Will use Ibuprofen as needed and it helps some but not much. She was 10 with her first periods and last was a month ago  - next due soon. She denies hx of abnormal pap-smears. No hx of cervical cancers. She denies family hx of uterine and ovarian cancers. 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 Denies vaginal discharge and drainage.   She has a good apetite. No nausea or vomitting. Denies diarrhea but has some constipation on pain med with her chronic pain related to DDD. [facility] following. Notes no bloody stools.   She notes no urinary sx.  She has had HIV test and states last was done 10 years ago. States had yearly for 7 years. Stabloe partner since. Not open to testing at this time.  She did quit smoking. She took up chewing. Asthma has done great and has not had to use RX at all.  She now presents.  Lipid Management History:     Positive NCEP/ATP III risk factors include HDL cholesterol less than 40.  Negative NCEP/ATP III risk factors include female age less than 41 years old, no history of early menopause without estrogen hormone replacement, non-diabetic, no family history for ischemic heart disease, non-tobacco-user status, non-hypertensive, no ASHD (atherosclerotic heart disease), no prior stroke/TIA, no peripheral vascular disease, and no history of aortic aneurysm.        The patient states that she knows about the "Therapeutic Lifestyle Change" diet.  Her compliance with the TLC diet is fair.  The patient expresses understanding of adjunctive measures for cholesterol lowering.  Adjunctive measures started by the patient include aerobic exercise, fiber, and omega-3 supplements.       Prior Medications Reviewed Using: Patient Recall  Updated Prior Medication List: FLEXERIL 10 MG TABS (CYCLOBENZAPRINE HCL) once daily KLONOPIN 1 MG TABS (CLONAZEPAM) once daily MULTIVITAMINS  TABS (MULTIPLE VITAMIN) One daily MS CONTIN 30 MG XR12H-TAB (MORPHINE SULFATE) four times a day DILAUDID 2 MG TABS (HYDROMORPHONE HCL) One by mouth three times daily SYNTHROID 25 MCG  TABS (LEVOTHYROXINE SODIUM) One daily  Current Allergies (reviewed today): ! MORPHINE  Past Medical History:  Reviewed history from 11/01/2006 and no changes required:       Current Problems:        ASTHMA, WITH ACUTE EXACERBATION (ICD-493.92)       HYPOTHYROIDISM (ICD-244.9)       DE QUERVAIN'S TENOSYNOVITIS, RIGHT WRIST (ICD-727.04)       CHEST XRAY, ABNORMAL (ICD-793.1)       WRIST PAIN, RIGHT (ICD-719.43)       ASTHMA (ICD-493.90)       TOBACCO ABUSE (ICD-305.1)       OBESITY NOS (ICD-278.00)       FOLLOW-UP, HIGH RISK TREATMENT NEC (ICD-V67.51)       FIBROIDS, UTERUS (ICD-218.9)       FIBROMYALGIA (ICD-729.1)       CONSTIPATION NOS (ICD-564.00)       LOW BACK PAIN (ICD-724.2)       HYPERLIPIDEMIA (ICD-272.4)       DEPRESSION (ICD-311)        ANXIETY (ICD-300.00)         Past Surgical History:    Reviewed history from 11/01/2006 and no changes required:       Tonsillectomy       Back surgery - L4-L5 fusion   Family History:    Reviewed history from 11/01/2006 and no changes required:       Father: Dead ETOH abuse, DM, Bone Marrow Cancer       Mother: Dead when pt was 36 months old - Pneumonia       Siblings: Sisters x 2 - 52 ETOH abuse and liver cirrhosis, 60 DDD C-spine, Anxiety       Brother 27 - ETOH abuse  Social History:    Reviewed history from 11/01/2006 and no changes required:       Occupation: Disabled fromback       Single       Alcohol use-no       Drug use-no       Former Smoker   Risk Factors:  Tobacco use:  quit    Year quit:  January 2009 regular tobacco    Pack-years:  pack per day for 10 years    Comments:  Chewing can a day Drug use:  no Alcohol use:  no  Family History Risk Factors:    Family History of MI in females < 83 years old:  no    Family History of MI in males < 59 years old:  no  Mammogram History:    Date of Last Mammogram:  11/18/2006  PAP Smear History:    Date of Last PAP Smear:  11/09/2006   Review of Systems      See HPI  General      Denies chills, fever, and sweats.  Resp      Denies cough, shortness of breath, sputum productive, and wheezing.  GI      Denies abdominal pain, constipation, diarrhea, nausea, and vomiting.  GU      Denies nocturia, urinary frequency, and urinary hesitancy.   Physical Exam  General:     Well-developed,well-nourished,in no acute distress; alert,appropriate and cooperative throughout examination. Tired appearing. Mildly ill. No distress. Breasts:     No mass, nodules, thickening, tenderness, bulging, retraction, inflamation, nipple discharge or skin changes noted.   Lungs:     Normal respiratory effort, chest expands symmetrically. Lungs are clear to auscultation, no crackles or wheezes. Heart:     Normal rate and regular  rhythm. S1 and S2 normal without gallop, murmur, click,  rub or other extra sounds. Abdomen:     Bowel sounds positive,abdomen soft and non-tender without masses, organomegaly or hernias noted. Genitalia:     Normal introitus for age, no external lesions, note white vaginal discharge, mucosa pink and moist, no vaginal or cervical lesions, no vaginal atrophy, note friaility and hemorrhage of os with smear, also os showed erythroplakia, normal uterus size and position, no adnexal masses or tenderness Extremities:     No clubbing, cyanosis, edema, or deformity noted with normal full range of motion of all joints.   Cervical Nodes:     No lymphadenopathy noted Axillary Nodes:     No palpable lymphadenopathy Psych:     Cognition and judgment appear intact. Alert and cooperative with normal attention span and concentration. No apparent delusions, illusions, hallucinations    Impression & Recommendations:  Problem # 1:  FIBROIDS, UTERUS (ICD-218.9) Menorghagia with irregular periods now. Suspect menopouse. Note abd pan and cramping with hx of fibroid. Repeat US and optomize. Orders: Radiology Referral (Radiology)   Problem # 2:  HYPOTHYROIDISM (ICD-244.9) Repeat lab and optomize. Her updated medication list for this problem includes:    Synthroid 25 Mcg Tabs (Levothyroxine sodium) ..... One daily  Orders: T-TSH ([id number])   Problem # 3:  HYPERLIPIDEMIA (ICD-272.4) Repeat and optomize per ATP III. Orders: T-Comprehensive Metabolic Panel [id number]) T-Lipid Profile ([id number])   Problem # 4:  DEPRESSION (ICD-311) Per Psych. Her updated medication list for this problem includes:    Klonopin 1 Mg Tabs (Clonazepam) ..... Once daily   Problem # 5:  ASTHMA, MILD (ICD-493.90) Stable. Off meds since stopping smoking. Stay course and follow. The following medications were removed from the medication list:    Proventil 90 Mcg/act Aers (Albuterol) .[name]..[name] Two puffs every 6 hours as  needed    Symbicort 160-4.5 Mcg/act Aero (Budesonide-formoterol fumarate) .[name]..[name] Two times a day    Prednisone 20 Mg Tabs (Prednisone) .[name]..[name] Two daily for 5 days, then one daily for 5 days, then 1/2 daily for 5 days    Duoneb 2.5-0.5 Mg/24ml Soln (Albuterol-ipratropium) ..... One neb every 4 hours as needed   Problem # 6:  TOBACCO ABUSE (ICD-305.1) Councelled cessation of chewing and states will try gum. Advised risk and benefit and congratulated on smoking cessation. The following medications were removed from the medication list:    Chantix Starting Month Pak 0.5 Mg X 11 & 1 Mg X 42 Misc (Varenicline tartrate) .[name]... As directed    Chantix Continuing Month Pak 1 Mg Tabs (Varenicline tartrate) .[name]... As directed   Problem # 7:  OBESITY NOS (ICD-278.00) TLC ad avised with diet, exersize and portion control.  Orders: T-Comprehensive Metabolic Panel ([id number])   Problem # 8:  Preventive Health Care (ICD-V70.0) Await pap and thin prep. Await mammo.   Complete Medication List: 1)  Flexeril 10 Mg Tabs (Cyclobenzaprine hcl) .... Once daily 2)  Klonopin 1 Mg Tabs (Clonazepam) .... Once daily 3)  Multivitamins Tabs (Multiple vitamin) .... One daily 4)  Ms Contin 30 Mg Xr12h-tab (Morphine sulfate) .... Four times a day 5)  Dilaudid 2 Mg Tabs (Hydromorphone hcl) .... One by mouth three times daily 6)  Synthroid 25 Mcg Tabs (Levothyroxine sodium) .... One daily  Other Orders: T-CBC No Diff ([id number]) Radiology Referral (Radiology)  Lipid Assessment/Plan:      Based on NCEP/ATP III, the patient's risk factor category is "0-1 risk factors".  From this information, the patient's calculated lipid goals are as follows: Total cholesterol goal is 200;  LDL cholesterol goal is 160; HDL cholesterol goal is 40; Triglyceride goal is 150.  Her LDL cholesterol goal has not been met.     Patient Instructions: 1)  Please schedule a follow-up appointment in 2 weeks.   ]  Appended Document: Orders  Update    Clinical Lists Changes  Problems: Added new problem of VAGINAL DISCHARGE (ICD-623.5) Orders: Added new Test order of T- * Misc. Laboratory test [phone]) - Signed  Appended Document: physical/arc Left message for patient to contact us and be more specific in what clinic she wants to go to.  Appended Document: physical/arc Pt returned call and wants to go to [facility]  Appended Document: physical/arc Pt scheduled with [facility] on Sep 23 at 1000.  Pt must arrive at 0930 and bring completed mail packet and insurance card.  Left message for patient to contact clinic.  Appended Document: physical/arc Pt advised of the above.  She is going to have Dianna contact us to see if there is a conflict with her schedule.

## 2010-10-15 NOTE — Assessment & Plan Note (Signed)
Summary: WORK IN/ DMS   Vital Signs:  Patient Profile:   50 Years Old Female Height:     67 inches (170.18 cm) Weight:      220 pounds BMI:     34.58 O2 Sat:      91 % Temp:     97.2 degrees F Pulse rate:   79 / minute Resp:     14 per minute BP sitting:   128 / 79  Vitals Entered By: Sherilyn Banker (August 28, 2007 8:14 AM)                After 1st neb O2sat still at 91% 97% after Duoneb.  PCP:  Franchot Heidelberg, MD  Chief Complaint:  follow up visit.  History of Present Illness: Pt in for recheck.  She was seen last Thursday. She had an asthma exacerbation and was treated with steroids, ZPak and she continued Proventil and Started Cymbicort. STates latter helps a lot but still winded and wheezing. She has onoy smoked three cigs a day and states she is just exhausted.   She denies cough but has still been wheezing. States when she coughs she brings up green mucous. States very thick. Needs something to loosen cough up  - did start Mucinex and states needs soimething stronger. She denies fever, chills and sweats. She has felt sleepy and notes this is aweful. She has chest pressure and notes feels like elephant on chest. She is winded on exertion. She notes no orthopnea or PND. NO leg swelling.  She has had a low apetite. Denies nausea and vomitting. No diarrhea or constipation. Drinking lots of teas and eating chicken  noodle soup.  No urinary sx except for some incontinence.   No side-effects on medications. States absolutely does not want to go to hospital. Notes her partner can care for her.   She now presents.  Current Allergies (reviewed today): ! MORPHINE  Past Medical History:    Reviewed history from 11/01/2006 and no changes required:       Anxiety       Depression       Hyperlipidemia       Low back pain - chronic. Pain clinic in [facility] is managing  Past Surgical History:    Reviewed history from 11/01/2006 and no changes required:  Tonsillectomy       Back surgery - L4-L5 fusion   Family History:    Reviewed history from 11/01/2006 and no changes required:       Father: Dead ETOH abuse, DM, Bone Marrow Cancer       Mother: Dead when pt was 23 months old - Pneumonia       Siblings: Sisters x 2 - 52 ETOH abuse and liver cirrhosis, 60 DDD C-spine, Anxiety       Brother 80 - ETOH abuse  Social History:    Reviewed history from 11/01/2006 and no changes required:       Occupation: Disabled fromback       Single       Alcohol use-no       Drug use-no       Former Smoker    Review of Systems      See HPI  General      Complains of fatigue and malaise.  CV      See HPI  Resp      See HPI  GI      See HPI  GU  See HPI   Physical Exam  General:     Well-developed,well-nourished,in no acute distress; alert,appropriate and cooperative throughout examination. Tired appearing. Mildly ill. No distress. Lungs:     Decreased airation with in and exp wheezes throughout.  Heart:     Normal rate and regular rhythm. S1 and S2 normal without gallop, murmur, click, rub or other extra sounds. Abdomen:     Bowel sounds positive,abdomen soft and non-tender without masses, organomegaly or hernias noted. Extremities:     No clubbing, cyanosis, edema, or deformity noted with normal full range of motion of all joints.      Impression & Recommendations:  Problem # 1:  ASTHMA, WITH ACUTE EXACERBATION (ICD-493.92) Discussed. Gave single dose Albuterol with some improvement. Then gave Duoneb and sx improved dramatically with O2 sat up to 97% and very good airation with no wheezing.Marland KitchenShe was advised need for close follow-up and hospitalization for IV steroid Rx was offered but she declined. Hence will start high dose steroid taper, home nebulizations. She was councelled on rest,fluid push and immediate update if sx worsen. She just fnished her ZPak and was advised on need for CXR and CBC. She is agreeable. She was  councelled on need to go to ED if sx worsen. Agrees.  Her updated medication list for this problem includes:    Proventil 90 Mcg/act Aers (Albuterol) .[name]..[name] Two puffs every 6 hours as needed    Symbicort 160-4.5 Mcg/act Aero (Budesonide-formoterol fumarate) .[name]..[name] Two times a day    Prednisone 20 Mg Tabs (Prednisone) .[name]..[name] Two daily for 5 days, then one daily for 5 days, then 1/2 daily for 5 days    Duoneb 2.5-0.5 Mg/49ml Soln (Albuterol-ipratropium) ..... One neb every 4 hours as needed  Orders: Albuterol inh. 1mg  Albuterol or .5 Levalbuterol (Z6109) Nebulizer Tx (60454) T-CBC w/Diff (09811-91478) T-Chest x-ray, 2 views (71020) Albuterol inh. 1mg  Albuterol or .5 Levalbuterol (G9562) Nebulizer Tx (13086) Atrovent 1mg  (Neb) (V7846)   Complete Medication List: 1)  Flexeril 10 Mg Tabs (Cyclobenzaprine hcl) .... Once daily 2)  Klonopin 1 Mg Tabs (Clonazepam) .... Once daily 3)  Multivitamins Tabs (Multiple vitamin) .... One daily 4)  Senokot Tabs (Sennosides tabs) .... As needed 5)  Kadian 50 Mg Cp24 (Morphine sulfate) .... 50 mg by mouth qam and 30mg  qpm 6)  Dilaudid 2 Mg Tabs (Hydromorphone hcl) .... One by mouth three times daily 7)  Proventil 90 Mcg/act Aers (Albuterol) .... Two puffs every 6 hours as needed 8)  Chantix Starting Month Pak 0.5 Mg X 11 & 1 Mg X 42 Misc (Varenicline tartrate) .... As directed 9)  Synthroid 25 Mcg Tabs (Levothyroxine sodium) .... One daily 10)  Symbicort 160-4.5 Mcg/act Aero (Budesonide-formoterol fumarate) .... Two times a day 11)  Chantix Continuing Month Pak 1 Mg Tabs (Varenicline tartrate) .... As directed 12)  Prednisone 20 Mg Tabs (Prednisone) .... Two daily for 5 days, then one daily for 5 days, then 1/2 daily for 5 days 13)  Duoneb 2.5-0.5 Mg/15ml Soln (Albuterol-ipratropium) .... One neb every 4 hours as needed   Patient Instructions: 1)  Please schedule a follow-up appointment in 1 week - sooner if needed.    Prescriptions: DUONEB 2.5-0.5  MG/3ML  SOLN (ALBUTEROL-IPRATROPIUM) One neb every 4 hours as needed  #100 x 3   Entered and Authorized by:   Franchot Heidelberg MD   Signed by:   Franchot Heidelberg MD on 08/28/2007   Method used:   Print then Give to Patient   RxID:  [id number] PREDNISONE 20 MG  TABS (PREDNISONE) Two daily for 5 days, then One daily for 5 days, then 1/2 daily for 5 days  #QS x 0   Entered and Authorized by:   Franchot Heidelberg MD   Signed by:   Franchot Heidelberg MD on 08/28/2007   Method used:   Print then Give to Patient   RxID:   [id number]  ]  Medication Administration  Medication # 1:    Medication: Albuterol inh. 1mg  Albuterol or .5 Levalbuterol    Diagnosis: ASTHMA, WITH ACUTE EXACERBATION (UKG-254.27)    Dose: 2.5mg     Route: inhaled    Exp Date: 01/12/2008    Lot #: [id number]    Mfr: dey    Patient tolerated medication without complications    Given by: Sherilyn Banker (August 28, 2007 8:29 AM)  Medication # 2:    Medication: Albuterol inh. 1mg  Albuterol or .5 Levalbuterol    Diagnosis: ASTHMA, WITH ACUTE EXACERBATION (JSE-831.51)    Dose: 2.5mg     Route: inhaled    Exp Date: 01/12/2008    Lot #: [id number]    Mfr: dey    Patient tolerated medication without complications    Given by: Sherilyn Banker (August 28, 2007 9:22 AM)  Medication # 3:    Medication: Atrovent 1mg  (Neb)    Diagnosis: ASTHMA, WITH ACUTE EXACERBATION (WVP-710.62)    Dose: 2.5mg     Route: inhaled    Exp Date: 01/12/2008    Lot #: [id number]    Mfr: dey    Patient tolerated medication without complications    Given by: Sherilyn Banker (August 28, 2007 9:24 AM)  Orders Added: 1)  Albuterol inh. 1mg  Albuterol or .5 Levalbuterol [J7603] 2)  Nebulizer Tx [94640] 3)  T-CBC w/Diff [62703-50093] 4)  T-Chest x-ray, 2 views [71020] 5)  Albuterol inh. 1mg  Albuterol or .5 Levalbuterol [J7603] 6)  Nebulizer Tx [94640] 7)  Atrovent 1mg  (Neb) [G1829]

## 2010-10-15 NOTE — Progress Notes (Signed)
Summary: SOB/chest pain  Phone Note Call from Patient   Caller: Patient Summary of Call: call from patient c/o dyspnea, and chest pain. Will work in today. Told to come in now, patient said it would be an hour as she had a meeting at work she had to attend. I told her I will not hold this slot and she needed to come in now, she agreed Initial call taken by: Sonny Dandy,  August 24, 2007 2:36 PM  Follow-up for Phone Call        Appt Scheduled Today Follow-up by: Donneta Romberg,  August 24, 2007 3:01 PM

## 2010-10-15 NOTE — Medication Information (Signed)
Summary: Tax adviser   Imported By: Curtis Sites 10/10/2007 10:19:05  _____________________________________________________________________  External Attachment:    Type:   Image     Comment:   External Document

## 2010-10-15 NOTE — Letter (Signed)
Summary: [facility]   Imported By: Curtis Sites 07/30/2008 11:58:06  _____________________________________________________________________  External Attachment:    Type:   Image     Comment:   External Document

## 2010-10-15 NOTE — Progress Notes (Signed)
Summary: [facility] - unidentified tube  Phone Note From Other Clinic   Request: Talk with Nurse, Talk with Provider Summary of Call: Asher Muir from [facility] [phone]) called and advised "no urine received" and tube did not have patient name on it.  Please call Asher Muir.    Follow-up for Phone Call        Pt was supposed to have had TSH with free T3 and T4 drawn. Nurse to contact [facility]. 24 Urine Creat Clearance ordered in error through EMR. Follow-up by: Franchot Heidelberg MD,  December 04, 2006 6:36 PM  Additional Follow-up for Phone Call Additional follow up Details #1::        jaimie contacted. issue resolved Additional Follow-up by: Sherilyn Banker,  December 05, 2006 9:16 AM

## 2010-10-15 NOTE — Progress Notes (Signed)
Summary: cxr results  Phone Note Call from Patient   Caller: Patient Summary of Call: patient called and made aware of cxy, she will be out of town until next Friday and she will make appoint at that time Initial call taken by: Sonny Dandy,  April 12, 2007 4:05 PM

## 2010-10-15 NOTE — Letter (Signed)
Summary: Misc Notes  Misc Notes   Imported By: Lutricia Horsfall LPN 29/52/8413 24:40:10  _____________________________________________________________________  External Attachment:    Type:   Image     Comment:   External Document

## 2010-10-15 NOTE — Miscellaneous (Signed)
Summary: consent for injection  consent for injection   Imported By: Donneta Romberg 07/26/2007 15:36:09  _____________________________________________________________________  External Attachment:    Type:   Image     Comment:   External Document

## 2010-10-15 NOTE — Assessment & Plan Note (Signed)
Summary: 1 month follow up/arc   Vital Signs:  Patient Profile:   50 Years Old Female Height:     67 inches (170.18 cm) Weight:      223 pounds BMI:     35.05 O2 Sat:      100 % Temp:     97.3 degrees F Pulse rate:   99 / minute Resp:     14 per minute BP sitting:   119 / 86  Vitals Entered By: Sherilyn Banker (July 20, 2007 9:16 AM)                 PCP:  Franchot Heidelberg, MD  Chief Complaint:  follow up visit.  History of Present Illness: Pt in for recheck.  She is doing well. She had recent labs due to malaise and fatigue. This showed a TSH of 8.306. She started syntrhoid 25 micrograms and notes this has helped. She is not as tired as before. She has not had side-effects on the medication. She has been on this for 6 weeks now.   She continues toeat healthy and is exersizing. She is down four pounds since last visit. She is hopeful thiws will continue.   Smoking cessation is set for December.  She is curious about a smoke buddy and handout to be given.  She has asthma. She was denied Advair by her insurcane. She is using her Proventil 4 times daily. She used Advair and this helped so much that she could cut back on her Proventil. She denies fever, chills and sweats. She has some wheezing. Out of Advair and states this is aweful  She continues with wrist pain. She states pain is moderate. Uses Brace at home and it helps. She has a positive finkelstein test. Would like shot. States Advil not helping.  Now presents.  Lipid Management History:      Positive NCEP/ATP III risk factors include HDL cholesterol less than 40.  Negative NCEP/ATP III risk factors include female age less than 34 years old, no history of early menopause without estrogen hormone replacement, non-diabetic, no family history for ischemic heart disease, non-tobacco-user status, non-hypertensive, no ASHD (atherosclerotic heart disease), no prior stroke/TIA, no peripheral vascular disease, and no history of  aortic aneurysm.        The patient states that she knows about the "Therapeutic Lifestyle Change" diet.  Her compliance with the TLC diet is fair.  The patient expresses understanding of adjunctive measures for cholesterol lowering.  Adjunctive measures started by the patient include aerobic exercise, fiber, and omega-3 supplements.     Current Allergies (reviewed today): ! MORPHINE  Past Medical History:    Reviewed history from 11/01/2006 and no changes required:       Anxiety       Depression       Hyperlipidemia       Low back pain - chronic. Pain clinic in [facility] is managing  Past Surgical History:    Reviewed history from 11/01/2006 and no changes required:       Tonsillectomy       Back surgery - L4-L5 fusion   Family History:    Reviewed history from 11/01/2006 and no changes required:       Father: Dead ETOH abuse, DM, Bone Marrow Cancer       Mother: Dead when pt was 60 months old - Pneumonia       Siblings: Sisters x 2 - 52 ETOH abuse and liver cirrhosis, 60  DDD C-spine, Anxiety       Brother 48 - ETOH abuse  Social History:    Reviewed history from 11/01/2006 and no changes required:       Occupation: Disabled fromback       Single       Alcohol use-no       Drug use-no       Former Smoker    Review of Systems      See HPI   Physical Exam  General:     Well-developed,well-nourished,in no acute distress; alert,appropriate and cooperative throughout examination Lungs:     Normal respiratory effort, chest expands symmetrically. Lungs are clear to auscultation, no crackles or wheezes. Heart:     Normal rate and regular rhythm. S1 and S2 normal without gallop, murmur, click, rub or other extra sounds. Abdomen:     Bowel sounds positive,abdomen soft and non-tender without masses, organomegaly or hernias noted. Extremities:     No clubbing, cyanosis, edema, or deformity noted with normal full range of motion of all joints.  Positive pain with right wrist  finkelstein test. Psych:     Cognition and judgment appear intact. Alert and cooperative with normal attention span and concentration. 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 Agreeable. Councelled risk and benefit. All questions answered. Copy of consent to be scanned. Post care instructions reviewed. Will recheck 6 weeks. Area was prepped and draped in sterile fashion over right wrist area. 2 cc of Lidocaine 1 % without epi and 1/2 cc Depomedrol was injected after entering area 3/8" distal to radial styloid  at 45 degress to approach bone. Tolerated well with no side-effect or significant bleeding. Area draped. Again handout given on post injection care as per "[facility]". Recheck 6 weeks - sooner if needed. Wrist brace as directed. If persist, will need Ortho consult. Agrees. Follow-up if any concerns. Orders: Joint Aspirate / Injection, Small (04540)   Problem # 2:  ASTHMA (ICD-493.90) Insurance denies Advair. Start Pulmicort. Proventil as is. DC Advair. Recheck 6 weeks. Her updated medication list for this problem includes:    Proventil 90 Mcg/act Aers (Albuterol) .[name]..[name] Two puffs every 6 hours as needed    Pulmicort Flexhaler 180 Mcg/act Inha (Budesonide (inhalation)) ..... One puff two times a day   Problem # 3:  TOBACCO ABUSE (ICD-305.1) Advised again. She has script for Chantix. Quit date mid-December.  Handout given on quit buddy vias [email or web address]. Follow. Her updated medication list for this problem includes:    Chantix Starting Month Pak 0.5 Mg X 11 & 1 Mg X 42 Misc (Varenicline tartrate) .[name]... As directed   Problem # 4:  HYPOTHYROIDISM (ICD-244.9) Repeat TSH and optomize. Sx of fatigue improving. Her updated medication list for this problem includes:    Synthroid 25 Mcg Tabs (Levothyroxine sodium) ..... One  daily  Orders: T-TSH (98119-14782) Venipuncture (95621)   Problem # 5:  Preventive Health Care (ICD-V70.0) Update flu and pneumovax today given hx of asthma. Advised risk and benefit and agreeable.  Complete Medication List: 1)  Flexeril 10 Mg Tabs (Cyclobenzaprine hcl) .... Once daily 2)  Klonopin 1 Mg Tabs (Clonazepam) .... Once daily 3)  Multivitamins Tabs (Multiple vitamin) .... One daily 4)  Senokot Tabs (Sennosides tabs) .... As needed 5)  Kadian 50 Mg Cp24 (Morphine sulfate) .... 50 mg by mouth qam and 30mg  qpm 6)  Dilaudid  2 Mg Tabs (Hydromorphone hcl) .... One by mouth three times daily 7)  Proventil 90 Mcg/act Aers (Albuterol) .... Two puffs every 6 hours as needed 8)  Chantix Starting Month Pak 0.5 Mg X 11 & 1 Mg X 42 Misc (Varenicline tartrate) .... As directed 9)  Synthroid 25 Mcg Tabs (Levothyroxine sodium) .... One daily 10)  Pulmicort Flexhaler 180 Mcg/act Inha (Budesonide (inhalation)) .... One puff two times a day  Other Orders: Influenza Vaccine NON MCR (04540) Pneumococcal Vaccine (98119) Admin 1st Vaccine (14782)  Lipid Assessment/Plan:      Based on NCEP/ATP III, the patient's risk factor category is "0-1 risk factors".  From this information, the patient's calculated lipid goals are as follows: Total cholesterol goal is 200; LDL cholesterol goal is 160; HDL cholesterol goal is 40; Triglyceride goal is 150.  Her LDL cholesterol goal has not been met.       ]    Pneumovax Vaccine    Vaccine Type: Pneumovax    Site: right deltoid    Mfr: Merck    Dose: 0.5 ml    Route: IM    Given by: Sherilyn Banker    Exp. Date: 06/13/2009    VIS given: 04/10/96 version given July 20, 2007.  Influenza Vaccine    Vaccine Type: Fluvax Non-MCR    Site: left deltoid    Mfr: novartis    Dose: 0.5 ml    Route: IM    Given by: Sherilyn Banker    Exp. Date: 01/12/2008    VIS given: 03/12/05 version given July 20, 2007.  Flu Vaccine Consent Questions    Do you have a  history of severe allergic reactions to this vaccine? no    Any prior history of allergic reactions to egg and/or gelatin? no    Do you have a sensitivity to the preservative Thimersol? no    Do you have a past history of Guillan-Barre Syndrome? no    Do you currently have an acute febrile illness? no    Have you ever had a severe reaction to latex? no    Vaccine information given and explained to patient? yes    Are you currently pregnant? no

## 2010-10-15 NOTE — Assessment & Plan Note (Signed)
Summary: WORK IN PER AL/[facility]   Vital Signs:  Patient Profile:   50 Years Old Female Height:     67 inches (170.18 cm) Weight:      221 pounds BMI:     34.74 O2 Sat:      96 % Temp:     97 degrees F Pulse rate:   92 / minute Resp:     16 per minute BP sitting:   118 / 85  Vitals Entered By: Sherilyn Banker (August 24, 2007 3:14 PM)                 PCP:  Franchot Heidelberg, MD  Chief Complaint:  sinus & chest congestion x 2 days.  History of Present Illness: Pt in for acute visit.  She has asthma and just started getting sick last 48 hours. States acute. Notes bad weather starts this. She has not had fever, but notes chills. States throat hurt awefully yesterday, nothing today. She has a runny nose and notes clear. No ear pain. She has a cough. States hurts. Dry but has bad taste. She is wheezing.   She has been using Pulmicort and notes this just does not help like the Advair. Has had to increase Proventil to 6 times. Not helping much.  Her apetite is poor. She does not feel like eating. She has loose stools. No nausea or vomitting.  She denies ill contacts. No travel.  She continues to smoke. She is going to quit Dec 18 and has made the mind chnage - knows she can't go as she is.  Now presents.   Current Allergies (reviewed today): ! MORPHINE  Past Medical History:    Reviewed history from 11/01/2006 and no changes required:       Anxiety       Depression       Hyperlipidemia       Low back pain - chronic. Pain clinic in [facility] is managing  Past Surgical History:    Reviewed history from 11/01/2006 and no changes required:       Tonsillectomy       Back surgery - L4-L5 fusion   Family History:    Reviewed history from 11/01/2006 and no changes required:       Father: Dead ETOH abuse, DM, Bone Marrow Cancer       Mother: Dead when pt was 50 months old - Pneumonia       Siblings: Sisters x 2 - 52 ETOH abuse and liver cirrhosis, 60 DDD C-spine, Anxiety    Brother 61 - ETOH abuse  Social History:    Reviewed history from 11/01/2006 and no changes required:       Occupation: Disabled fromback       Single       Alcohol use-no       Drug use-no       Former Smoker    Review of Systems      See HPI   Physical Exam  General:     Well-developed,well-nourished,in no acute distress; alert,appropriate and cooperative throughout examination. Tired appearing. Mildly ill. No distress. Lungs:     Decreased airation with in and exp wheezes throughout. Heart:     Normal rate and regular rhythm. S1 and S2 normal without gallop, murmur, click, rub or other extra sounds. Abdomen:     Bowel sounds positive,abdomen soft and non-tender without masses, organomegaly or hernias noted. Extremities:     No clubbing, cyanosis, edema, or deformity  noted with normal full range of motion of all joints.Right wrist pain resolved. Happy. Psych:     Cognition and judgment appear intact. Alert and cooperative with normal attention span and concentration. No apparent delusions, illusions, hallucinations    Impression & Recommendations:  Problem # 1:  ASTHMA, WITH ACUTE EXACERBATION (ICD-493.92) Discussed. Single Albuterol neb. Sx improved. DC Pulmicort and switch to Symbicort. Suspect bad warm wather after very cold spell as cause. Give single dose Decadron/Depomedrol. Advised Calcium and Vit Supplement. Quit smoking. See below.  Update if sx worsen or do not improve. Proventil every 4 to 6 hours as needed.  recheck two weeks and optomize. Her updated medication list for this problem includes:    Proventil 90 Mcg/act Aers (Albuterol) .[name]..[name] Two puffs every 6 hours as needed    Symbicort 160-4.5 Mcg/act Aero (Budesonide-formoterol fumarate) .[name]..[name] Two times a day  Orders: Decadron 1mg  (B1478) Depo- Medrol 80mg  (J1040) Admin of Therapeutic Inj  intramuscular or subcutaneous (29562) Admin of Therapeutic Inj  intramuscular or subcutaneous (13086) Albuterol  Sulfate Sol 1mg  unit dose (V7846) Nebulizer Tx (96295)   Problem # 2:  DE QUERVAIN'S TENOSYNOVITIS, RIGHT WRIST (ICD-727.04) Resolved. Follow.  Problem # 3:  TOBACCO ABUSE (ICD-305.1) Resume Chantix on quit date of Aug 31, 2007. Advised risk and benefit. Aware of importance of quitting given hx of asthma and cancer risk. Recheck 2 weeks. Her updated medication list for this problem includes:    Chantix Starting Month Pak 0.5 Mg X 11 & 1 Mg X 42 Misc (Varenicline tartrate) .[name]... As directed    Chantix Continuing Month Pak 1 Mg Tabs (Varenicline tartrate) .[name]... As directed   Complete Medication List: 1)  Flexeril 10 Mg Tabs (Cyclobenzaprine hcl) .... Once daily 2)  Klonopin 1 Mg Tabs (Clonazepam) .... Once daily 3)  Multivitamins Tabs (Multiple vitamin) .... One daily 4)  Senokot Tabs (Sennosides tabs) .... As needed 5)  Kadian 50 Mg Cp24 (Morphine sulfate) .... 50 mg by mouth qam and 30mg  qpm 6)  Dilaudid 2 Mg Tabs (Hydromorphone hcl) .... One by mouth three times daily 7)  Proventil 90 Mcg/act Aers (Albuterol) .... Two puffs every 6 hours as needed 8)  Chantix Starting Month Pak 0.5 Mg X 11 & 1 Mg X 42 Misc (Varenicline tartrate) .... As directed 9)  Synthroid 25 Mcg Tabs (Levothyroxine sodium) .... One daily 10)  Symbicort 160-4.5 Mcg/act Aero (Budesonide-formoterol fumarate) .... Two times a day 11)  Chantix Continuing Month Pak 1 Mg Tabs (Varenicline tartrate) .... As directed   Patient Instructions: 1)  Please schedule a follow-up appointment in 2 weeks.    Prescriptions: CHANTIX STARTING MONTH PAK 0.5 MG X 11 & 1 MG X 42  MISC (VARENICLINE TARTRATE) As directed  #1 x 0   Entered and Authorized by:   Franchot Heidelberg MD   Signed by:   Franchot Heidelberg MD on 08/24/2007   Method used:   Print then Give to Patient   RxID:   [id number] CHANTIX CONTINUING MONTH PAK 1 MG  TABS (VARENICLINE TARTRATE) As directed  #1 x 1   Entered and Authorized by:   Franchot Heidelberg  MD   Signed by:   Franchot Heidelberg MD on 08/24/2007   Method used:   Print then Give to Patient   RxID:   [email or web address]  [email or web address]  Medication Administration  Injection # 1:    Medication: Decadron 1mg     Diagnosis: ASTHMA, WITH ACUTE EXACERBATION (ICD-493.92)    Route: IM  Site: L deltoid    Exp Date: 02/11/2009    Mfr: [employer] Regent    Patient tolerated injection without complications    Given by: Sherilyn Banker (August 24, 2007 3:51 PM)  Injection # 2:    Medication: Depo- Medrol 80mg     Diagnosis: ASTHMA, WITH ACUTE EXACERBATION (ZHY-865.78)    Route: IM    Site: R deltoid    Exp Date: 12/12/2009    Mfr: Pharmacia    Patient tolerated injection without complications    Given by: Sherilyn Banker (August 24, 2007 3:52 PM)  Medication # 1:    Medication: Albuterol Sulfate Sol 1mg  unit dose    Diagnosis: ASTHMA, WITH ACUTE EXACERBATION (ICD-493.92)    Dose: 2.5mg     Route: inhaled    Exp Date: 10/14/2008    Mfr: dey    Patient tolerated medication without complications    Given by: Sherilyn Banker (August 24, 2007 3:53 PM)  Orders Added: 1)  Est. Patient Level IV [46962] 2)  Decadron 1mg  [J1094] 3)  Depo- Medrol 80mg  [J1040] 4)  Admin of Therapeutic Inj  intramuscular or subcutaneous [90772] 5)  Admin of Therapeutic Inj  intramuscular or subcutaneous [90772] 6)  Albuterol Sulfate Sol 1mg  unit dose [J7613] 7)  Nebulizer Tx [95284]

## 2010-10-15 NOTE — Progress Notes (Signed)
Summary: needs Rx  Phone Note Call from Patient   Reason for Call: Talk to Nurse Summary of Call: patient is requesting a Rx for Valtrex she gets fever blisters occasionally use to get this Rx from her ob/gyn. She is getting ready to go out of town for a month...  [facility] Initial call taken by: Donneta Romberg,  June 07, 2007 1:58 PM  Follow-up for Phone Call        do you want [place] me to call this in for her or does she need to call her OB/GYN Follow-up by: Sherilyn Banker,  June 08, 2007 8:03 AM  Additional Follow-up for Phone Call Additional follow up Details #1::        Call In Valtrex 1g  2 by mouth two times a day times one day  #6 with 3 refill Additional Follow-up by: Franchot Heidelberg MD,  June 08, 2007 8:04 AM    Additional Follow-up for Phone Call Additional follow up Details #2::    called in to [facility] Follow-up by: Sherilyn Banker,  June 08, 2007 8:17 AM

## 2010-10-15 NOTE — Letter (Signed)
Summary: Prescription solutions  Prescription solutions   Imported By: Pablo Lawrence 07/04/2007 16:30:09  _____________________________________________________________________  External Attachment:    Type:   Image     Comment:   External Document

## 2010-10-15 NOTE — Progress Notes (Signed)
Summary: EFFEXOR SAMPLE REQUEST  Phone Note Call from Patient Call back at Home Phone [phone]   Caller: Patient Call For: nurse Reason for Call: Refill Medication Summary of Call: want samples of generic effexor. patient informed that samples left up front. Initial call taken by: Carlye Grippe,  April 22, 2009 4:18 PM    Prescriptions: VENLAFAXINE HCL 75 MG XR24H-TAB (VENLAFAXINE HCL) One daily  #14 x 0   Entered and Authorized by:   Carlye Grippe   Signed by:   Carlye Grippe on 04/22/2009   Method used:   Samples Given   RxID:   [id number]

## 2010-10-15 NOTE — Assessment & Plan Note (Signed)
Summary: WELL WOMAN EXAM/SLJ   Vital Signs:  Patient profile:   50 year old female Menstrual status:  regular LMP:     05/01/2009 Height:      67 inches Weight:      217 pounds O2 Sat:      97 % Temp:     98.0 degrees F oral Pulse rate:   87 / minute Resp:     16 per minute BP sitting:   126 / 86  (left arm) Cuff size:   regular  Vitals Entered By: Carlye Grippe (May 22, 2009 3:39 PM) CC: physical with pap LMP (date): 05/01/2009  years   days  Menstrual Status regular Enter LMP: 05/01/2009 Last PAP Result normal   Primary Provider:  Franchot Heidelberg, MD  CC:  physical with pap.  History of Present Illness: Pt in for recheck.  She is doing better emotionally since starting Zoloft and Wellbutrin. She is sleeping better. She states she  is less irritable, concentration is better. Energy is picking up. She denies side-eeffects on the Rx and notes no suicidial ideations. She is agreeable with dose increase on Zoloft. She is set to see Psychiatry in [place] June 23, 2009 and stattes working with psych to change date.   She states she also had a fever blister over her lower lip and wants me to look at it. States worried may be an infection as well.   She denies any breast concerns today. She notes no nipple retraction and discharge and breast pain. She had an abnormal mammo in her 30s but has been normal since. States was told at time repeat eval needed in 6 months. Had mass and rechecks and all was well. States thinks was left breast but nore sure. She denies personal hx of breast cancer and notes no family hx of same. She does not use OCP or HRT. She does not use ETOH and recently quit smoking and now chews. She denies having children. She is G0P0. SHe denies vaginal pain and notes has clear lqiuid discharge. States ithcy. She is sexually active - stable partner. She has a hx of chlamydia at age 37. No other STDs. She had many  HIV tests in past and all were normal. Has  chnage life - stopped drug use and states was told clear.   She now presents.  Current Problems (verified): 1)  Asthma, With Acute Exacerbation  (ICD-493.92) 2)  Gastritis  (ICD-535.50) 3)  Malaise and Fatigue  (ICD-780.79) 4)  Menorrhagia  (ICD-626.2) 5)  Asthma, Mild  (ICD-493.90) 6)  Hypothyroidism  (ICD-244.9) 7)  Hx of Tobacco Abuse  (ICD-305.1) 8)  Obesity Nos  (ICD-278.00) 9)  Follow-up, High Risk Treatment Nec  (ICD-V67.51) 10)  Fibroids, Uterus  (ICD-218.9) 11)  Fibromyalgia  (ICD-729.1) 12)  Constipation Nos  (ICD-564.00) 13)  Low Back Pain  (ICD-724.2) 14)  Hyperlipidemia  (ICD-272.4) 15)  Depression  (ICD-311) 16)  Anxiety  (ICD-300.00)  Current Medications (verified): 1)  Multivitamins  Tabs (Multiple Vitamin) .... One Daily 2)  Kadian 50 Mg Xr24h-Cap (Morphine Sulfate) .... Two Times A Day 3)  Synthroid 50 Mcg Tabs (Levothyroxine Sodium) .... Once Daily 4)  Neurontin 600 Mg Tabs (Gabapentin) .... Take 1 Tablet By Mouth Three Times A Day 5)  Proventil Hfa 108 (90 Base) Mcg/act Aers (Albuterol Sulfate) .... One Inh Every 6 Hours If Needed 6)  Advair Diskus 250-50 Mcg/dose Misc (Fluticasone-Salmeterol) .... One Inh Two Times A Day 7)  Wellbutrin Sr 100  Mg Xr12h-Tab (Bupropion Hcl) .... One Daily 8)  Sertraline Hcl 50 Mg Tabs (Sertraline Hcl) .... One Nightly  Allergies (verified): 1)  ! Morphine 2)  ! * Venlafaxine  Past History:  Past Medical History: Last updated: 05/09/2008 Current Problems:  ASTHMA, WITH ACUTE EXACERBATION (ICD-493.92) HYPOTHYROIDISM (ICD-244.9) DE QUERVAIN'S TENOSYNOVITIS, RIGHT WRIST (ICD-727.04) CHEST XRAY, ABNORMAL (ICD-793.1) WRIST PAIN, RIGHT (ICD-719.43) ASTHMA (ICD-493.90) TOBACCO ABUSE (ICD-305.1) OBESITY NOS (ICD-278.00) FOLLOW-UP, HIGH RISK TREATMENT NEC (ICD-V67.51) FIBROIDS, UTERUS (ICD-218.9) FIBROMYALGIA (ICD-729.1) CONSTIPATION NOS (ICD-564.00) LOW BACK PAIN (ICD-724.2) HYPERLIPIDEMIA (ICD-272.4) DEPRESSION  (ICD-311) ANXIETY (ICD-300.00)  Past Surgical History: Last updated: 11/01/2006 Tonsillectomy Back surgery - L4-L5 fusion  Family History: Last updated: March 27, 2009 Father: Dead age 3 ETOH abuse, DM, Bone Marrow Cancer Mother: Dead  at 18 when pt was 7 months old - Pneumonia Siblings: Sisters x 2 - 55 dead ETOH abuse and liver cirrhosis and 65 DDD C-spine, Anxiety Brother 34 - ETOH abuse Kids - none  Social History: Last updated: 03/27/09 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 Tired appearing.  Mouth:  Oral mucosa and oropharynx without lesions or exudates.  Small red area lower outer left lip. Flat. No puss. Breasts:  No mass, nodules, thickening, tenderness, bulging, retraction, inflamation, nipple discharge or skin changes noted.   Lungs:  Decreased airation. No wheeze. Heart:  Normal rate and regular rhythm. S1 and S2 normal without gallop, murmur, click, rub or other extra sounds. Abdomen:  Bowel sounds positive,abdomen soft and non-tender without masses, organomegaly or hernias noted. Genitalia:  Normal introitus for age, no external lesions, note white yeast appearing vaginal discharge, mucosa pink and moist, no vaginal or cervical lesions except nabothian follicles, no vaginal atrophy, note friaility but no hemorrhage, normal uterus size and position, no adnexal masses or tenderness Extremities:  No clubbing, cyanosis, edema, or deformity noted with normal full range of motion of all joints.   Neurologic:  No cranial nerve deficits noted. Station and gait are normal. Plantar  reflexes are down-going bilaterally. DTRs are symmetrical throughout. Sensory, motor and coordinative functions appear intact. Cervical Nodes:  No lymphadenopathy noted Axillary Nodes:  No palpable lymphadenopathy Psych:  Cognition and judgment appear intact. Alert and cooperative with normal attention span and concentration. No apparent delusions, illusions, hallucinations   Impression & Recommendations:  Problem # 1:  FEVER BLISTER (ICD-054.9) Discussed. Completed course of Valtrex and better but still red area. Advised likely yeast or reperative changes. May use OTC Triamcinolone/Nystatin and if persist of worsen,update immediately. Councelledlip moisturizer and sunlight avoidance.  Problem # 2:  VAGINITIS (ICD-616.10) Discussed. Suspect yeast. Single dose diflucan. Advised risk and benefit. Councelled proper use. Await wet prep.  Problem # 3:  DEPRESSION (ICD-311) Improving. Optomize zoloft and increase dose today. Advised risk and benefit and agreeable. Cont other Rx and see Psych as set.  Her updated medication list for this problem includes:    Wellbutrin Sr 100 Mg Xr12h-tab (Bupropion hcl) ..... One daily    Zoloft 100 Mg Tabs (Sertraline hcl) ..... One at bedtime  Problem # 4:  Preventive Health Care (ICD-V70.0) Discussed. Refer screening mammo. Await pap. Advised flu shot. Discussed HIV and declined testing today.  Complete Medication List: 1)  Multivitamins Tabs (Multiple vitamin) .... One daily 2)  Kadian 50 Mg Xr24h-cap (Morphine sulfate) .... Two times a day 3)  Synthroid 50 Mcg Tabs (Levothyroxine sodium) .... Once daily 4)  Neurontin 600 Mg Tabs (Gabapentin) .... Take 1 tablet by mouth three times a day 5)  Proventil Hfa 108 (90 Base) Mcg/act Aers (Albuterol sulfate) .... One inh every 6 hours if needed 6)  Advair Diskus 250-50 Mcg/dose Misc (Fluticasone-salmeterol) .... One inh two times a day 7)  Wellbutrin Sr 100 Mg Xr12h-tab (Bupropion hcl) .... One daily 8)  Zoloft  100 Mg Tabs (Sertraline hcl) .... One at bedtime 9)  Diflucan 200 Mg Tabs (Fluconazole) .... One by mouth now.  Other Orders: Radiology Referral (Radiology)  Patient Instructions: 1)  Please schedule a follow-up appointment in 1 month with new PCP. We will call with Mammo and pap results. Prescriptions: DIFLUCAN 200 MG TABS (FLUCONAZOLE) One by mouth now.  #1 x 0   Entered and Authorized by:   Franchot Heidelberg MD   Signed by:   Franchot Heidelberg MD on 05/22/2009   Method used:   Electronically to        [facility]* (retail)       [address]       [place], [place]  [address]       Ph: [phone]       Fax: [phone]   RxID:   [id number] SYNTHROID 50 MCG TABS (LEVOTHYROXINE SODIUM) once daily  #90 x 0   Entered and Authorized by:   Franchot Heidelberg MD   Signed by:   Franchot Heidelberg MD on 05/22/2009   Method used:   Electronically to        [facility]* (retail)       [address]       [place], [place]  [address]       Ph: [phone]       Fax: [phone]   RxID:   [id number] WELLBUTRIN SR 100 MG XR12H-TAB (BUPROPION HCL) One daily  #90 x 0   Entered and Authorized by:   Franchot Heidelberg MD   Signed by:   Franchot Heidelberg MD on 05/22/2009   Method used:   Electronically to        [facility]* (retail)       [address]       [place], [place]  [address]       Ph: [phone]       Fax: [phone]   RxID:   [id number] ZOLOFT 100 MG TABS (SERTRALINE HCL) One at bedtime  #90 x 0   Entered and Authorized by:   Franchot Heidelberg MD   Signed by:   Franchot Heidelberg MD on 05/22/2009   Method used:   Electronically to        [facility]* (retail)       [address]       [place], [place]  [address]       Ph: [phone]       Fax: [phone]   RxID:   [id number]   Appended Document: WELL WOMAN EXAM/SLJ     Allergies: 1)  ! Morphine 2)  ! *  Venlafaxine   Complete Medication List: 1)  Multivitamins Tabs (Multiple vitamin) .Marland KitchenMarland KitchenMarland Kitchen  One daily 2)  Kadian 50 Mg Xr24h-cap (Morphine sulfate) .... Two times a day 3)  Synthroid 50 Mcg Tabs (Levothyroxine sodium) .... Once daily 4)  Neurontin 600 Mg Tabs (Gabapentin) .... Take 1 tablet by mouth three times a day 5)  Proventil Hfa 108 (90 Base) Mcg/act Aers (Albuterol sulfate) .... One inh every 6 hours if needed 6)  Advair Diskus 250-50 Mcg/dose Misc (Fluticasone-salmeterol) .... One inh two times a day 7)  Wellbutrin Sr 100 Mg Xr12h-tab (Bupropion hcl) .... One daily 8)  Zoloft 100 Mg Tabs (Sertraline hcl) .... One at bedtime 9)  Diflucan 200 Mg Tabs (Fluconazole) .... One by mouth now.  Other Orders: T-Wet Prep for Christoper Allegra, [facility] [phone])  Appended Document: WELL WOMAN EXAM/SLJ mammogram appt scheduled on 05/27/09 @10 :00am at [facility]. patient informed and order faxed.

## 2010-10-15 NOTE — Assessment & Plan Note (Signed)
Summary: Breast and genital exam    Prior Medications: FLEXERIL 10 MG TABS (CYCLOBENZAPRINE HCL) once daily KLONOPIN 1 MG TABS (CLONAZEPAM) once daily MULTIVITAMINS  TABS (MULTIPLE VITAMIN)  SENOKOT  TABS (SENNOSIDES TABS)  Current Allergies: ! MORPHINE      Physical Exam  Breasts:     No mass, nodules, thickening, tenderness, bulging, retraction, inflamation, nipple discharge or skin changes noted.   Rectal:     No external abnormalities noted. Normal sphincter tone. No rectal masses or tenderness. Genitalia:     Normal introitus for age, no external lesions, no vaginal discharge, mucosa pink and moist, no vaginal or cervical lesions, no vaginal atrophy, no friaility or hemorrhage, normal uterus size and position, no adnexal masses or tenderness

## 2010-10-15 NOTE — Assessment & Plan Note (Signed)
Summary: 4 week follow up with TSH and Free levels/arc   Vital Signs:  Patient Profile:   50 Years Old Female Height:     67 inches (170.18 cm) Weight:      255 pounds BMI:     40.08 O2 Sat:      98 % Pulse rate:   94 / minute Resp:     16 per minute BP sitting:   139 / 86  Pt. in pain?   no                Visit Type:  Recheck PCP:  Franchot Heidelberg, MD  Chief Complaint:  follow up visit.  History of Present Illness: Pt in for follow-up today.  She had a recent physical.  Labs from that is reviewed:  1. CBC - normal 2. CMP - normal 3. Lipids - see below 4. TSH > 7 5. Normal Mammogram  She states she is tired all the times. She has dry skin. She gets constipated. Thinks this could be related to pain meds for her chronic pain. Denies excess hair loss. No fam hx of thryoid illness. No hx of abnormality and no prior med use. She sleeps all the time.   Lipid Management History:      Positive NCEP/ATP III risk factors include HDL cholesterol less than 40.  Negative NCEP/ATP III risk factors include female age less than 53 years old, no history of early menopause without estrogen hormone replacement, non-diabetic, no family history for ischemic heart disease, non-tobacco-user status, non-hypertensive, no ASHD (atherosclerotic heart disease), no prior stroke/TIA, no peripheral vascular disease, and no history of aortic aneurysm.        The patient states that she knows about the "Therapeutic Lifestyle Change" diet.  Her compliance with the TLC diet is fair.  Adjunctive measures started by the patient include aerobic exercise, fiber, omega-3 supplements, and weight reduction.     Prior Medications: FLEXERIL 10 MG TABS (CYCLOBENZAPRINE HCL) once daily KLONOPIN 1 MG TABS (CLONAZEPAM) once daily MULTIVITAMINS  TABS (MULTIPLE VITAMIN)  SENOKOT  TABS (SENNOSIDES TABS)  KADIAN 50 MG CP24 (MORPHINE SULFATE) 50 mg by mouth qam and 30mg  qpm DILAUDID 2 MG TABS (HYDROMORPHONE HCL)  One by mouth three times daily Current Allergies: ! MORPHINE     Review of Systems      See HPI   Physical Exam  General:     Well-developed,well-nourished,in no acute distress; alert,appropriate and cooperative throughout examination Lungs:     Normal respiratory effort, chest expands symmetrically. Lungs are clear to auscultation, no crackles or wheezes. Heart:     Normal rate and regular rhythm. S1 and S2 normal without gallop, murmur, click, rub or other extra sounds. Abdomen:     Bowel sounds positive,abdomen soft and non-tender without masses, organomegaly or hernias noted. Extremities:     No clubbing, cyanosis, edema, or deformity noted with normal full range of motion of all joints.      Impression & Recommendations:  Problem # 1:  THYROID STIMULATING HORMONE, ABNORMAL (ICD-246.9) Abnormal TSH with some symptoms suggestive of disease. Repeat level with free hormones and optimize. Orders: T-TSH [phone]) T- 24 Hr.Urine Creatinine Clearance ([id number])   Problem # 2:  HYPERLIPIDEMIA (ICD-272.4) Councelled on TLC. Recheck on year. Pt agrees.  Problem # 3:  OBESITY NOS (ICD-278.00) Weight loss a must. Councelled on this at length today.  Problem # 4:  CONSTIPATION NOS (ICD-564.00) Likley narcotics induced. Councelled fruits, veggies and adeuqte hydration. Her updated  medication list for this problem includes:    Senokot Tabs (Sennosides tabs)   Lipid Assessment/Plan:      Based on NCEP/ATP III, the patient's risk factor category is "0-1 risk factors".  From this information, the patient's calculated lipid goals are as follows: Total cholesterol goal is 200; LDL cholesterol goal is 160; HDL cholesterol goal is 40; Triglyceride goal is 150.  Her LDL cholesterol goal has not been met.     Patient Instructions: 1)  Please schedule a follow-up appointment in 3 months - sooner if needed.  Appended Document: Mammo and Pap result    Clinical Lists  Changes  Observations: Added new observation of PAP DUE: 11/2007 (12/01/2006 15:29) Added new observation of MAMMO DUE: 11/2007 (12/01/2006 15:29) Added new observation of MAMMOGRAM: normal (11/18/2006 15:30) Added new observation of PAP SMEAR: normal (11/09/2006 15:31)      Preventive Care Screening  Pap Smear:    Date:  11/09/2006    Next Due:  11/2007    Results:  normal  Mammogram:    Date:  11/18/2006    Next Due:  11/2007    Results:  normal

## 2010-10-15 NOTE — Letter (Signed)
Summary: Lab Reports  Lab Reports   Imported By: Lutricia Horsfall LPN 81/19/1478 29:56:21  _____________________________________________________________________  External Attachment:    Type:   Image     Comment:   External Document

## 2010-10-15 NOTE — Assessment & Plan Note (Signed)
Summary: Yearly physical   Vital Signs:  Patient Profile:   50 Years Old Female Height:     67 inches (170.18 cm) Weight:      256 pounds BMI:     40.24 O2 Sat:      98 % Temp:     97.2 degrees F Pulse rate:   100 / minute Resp:     16 per minute BP sitting:   126 / 82  Pt. in pain?   no                Visit Type:  Yearly Physical PCP:  Franchot Heidelberg, MD  Chief Complaint:  needs physical.  History of Present Illness: Pt presents today for her yearly physical.  She has not had this in two years.  She denies any breast pain. No nipple retraction or drainage. She does not do self exams. She does not have a fam hx of breast cancer. No hx of abnormal mammograms - did have cyst in right or left breast.    She has not had hx. G0P0. Her first period was at age 67 and last was one week ago. She notes no hx of abnormal paps but does have a hx of fibroids. She has begun having hot flashes over the last 12 months. Some periods heavier than usual, some almost minimal. Does get irritable. Has been sleeping well. Gets bad PMDS. Has to take Klonopin for it.   She has no hx of STDs. Currently sexually active. She is homosexual. No hx of DES.    Prior Medications: FLEXERIL 10 MG TABS (CYCLOBENZAPRINE HCL) once daily KLONOPIN 1 MG TABS (CLONAZEPAM) once daily MULTIVITAMINS  TABS (MULTIPLE VITAMIN)  SENOKOT  TABS (SENNOSIDES TABS)  Current Allergies (reviewed today): ! MORPHINE  Past Medical History:    Anxiety    Depression    Hyperlipidemia    Low back pain - chronic. Pain clinic in [facility] is managing  Past Surgical History:    Tonsillectomy    Back surgery - L4-L5 fusion   Family History:    Father: Dead ETOH abuse, DM, Bone Marrow Cancer    Mother: Dead when pt was 66 months old - Pneumonia    Siblings: Sisters x 2 - 52 ETOH abuse and liver cirrhosis, 60 DDD C-spine, Anxiety    Brother 44 - ETOH abuse  Social History:    Occupation: Disabled fromback  Single    Alcohol use-no    Drug use-no    Former Smoker    Sex Orientation:  Homosexual   Risk Factors:  Tobacco use:  quit    Year quit:  2007    Pack-years:  pack per day for 10 years    Comments:  Chantix helped Drug use:  no Alcohol use:  no  Family History Risk Factors:    Family History of MI in females < 12 years old:  no    Family History of MI in males < 65 years old:  no   Review of Systems  General      Denies chills, fever, and sweats.  CV      Denies bluish discoloration of lips or nails, fatigue, near fainting, palpitations, swelling of feet, swelling of hands, and weight gain.  Resp      Denies chest discomfort, cough, shortness of breath, sputum productive, and wheezing.  GI      Denies abdominal pain, constipation, diarrhea, nausea, and vomiting.      Does get  constpated from pain meds - senna helps but makes her blaoty and nauseated.  GU      See HPI      Denies dysuria and urinary hesitancy.  Derm      Denies changes in color of skin, changes in nail beds, dryness, itching, and poor wound healing.  Psych      Denies anxiety, depression, sense of great danger, thoughts of violence, unusual visions or sounds, and thoughts /plans of harming others.      Off antidepressants - doing good. Denies significant stress. Did not taper - stopped abruptly. Was sick for a few weeks. Now better. No sx.  Endo      Denies cold intolerance, excessive hunger, excessive thirst, excessive urination, polyuria, and weight change.  Heme      Denies abnormal bruising and bleeding.  Allergy      Denies hives or rash, itching eyes, and seasonal allergies.   Physical Exam  General:     Well-developed,well-nourished,in no acute distress; alert,appropriate and cooperative throughout examination Head:     Normocephalic and atraumatic without obvious abnormalities. No apparent alopecia or balding. Eyes:     No corneal or conjunctival inflammation noted. EOMI.  Perrla. Funduscopic exam benign, without hemorrhages, exudates or papilledema. Vision grossly normal. Ears:     External ear exam shows no significant lesions or deformities.  Otoscopic examination reveals clear canals, tympanic membranes are intact bilaterally without bulging, retraction, inflammation or discharge. Hearing is grossly normal bilaterally. Nose:     External nasal examination shows no deformity or inflammation. Nasal mucosa are pink and moist without lesions or exudates. Mouth:     Oral mucosa and oropharynx without lesions or exudates.  Teeth in good repair. Lungs:     Normal respiratory effort, chest expands symmetrically. Lungs are clear to auscultation, no crackles or wheezes. Heart:     Normal rate and regular rhythm. S1 and S2 normal without gallop, murmur, click, rub or other extra sounds. Abdomen:     Bowel sounds positive,abdomen soft and non-tender without masses, organomegaly or hernias noted. Msk:     Deferred to pain management Extremities:     No clubbing, cyanosis, edema, or deformity noted with normal full range of motion of all joints.   Neurologic:     No cranial nerve deficits noted. Station and gait are normal. Plantar reflexes are down-going bilaterally. DTRs are symmetrical throughout. Sensory, motor and coordinative functions appear intact. Skin:     Intact without suspicious lesions or rashes Psych:     Cognition and judgment appear intact. Alert and cooperative with normal attention span and concentration. No apparent delusions, illusions, hallucinations    Impression & Recommendations:  Problem # 1:  WELL ADULT EXAM (ICD-V70.0) Reviewed. Pt set up for Mammo at [facility] next week - did this herself. Advised yearly eyechck, dental care, Calcium and Vit D supplement. Diet, exersize and weight reduction a must. Safe sex improtant as well given alternative lifestyle.  Problem # 2:  FOLLOW-UP, HIGH RISK TREATMENT NEC (ICD-V67.51) Chck labs given  use on longterm narcotics. Orders: T-Comprehensive Metabolic Panel ([id number])   Problem # 3:  DEPRESSION (ICD-311) Stable. Off meds. Monitor for recurrence of sx. Discussed climacterium and how this cn exacerbate. Outlined how use of certain SSRIs have been shown to decrease sx of hot flashes. Pt not open to this. reviewed alternatives such as black cohosh, evening primrose oil etc. The following medications were removed from the medication list:    Zoloft 100  Mg Tabs (Sertraline hcl) ..... Once daily    Wellbutrin Xl 300 Mg Tb24 (Bupropion hcl) ..... Once daily  Her updated medication list for this problem includes:    Klonopin 1 Mg Tabs (Clonazepam) ..... Once daily  Orders: T-CBC w/Diff ([id number]) T-TSH ([phone])   Problem # 4:  CONSTIPATION NOS (ICD-564.00) Adeuqte fluid and fiber a must. Likley sec to use of narcotics. Councelled on fleets enemas, Magcitrate for acute episodes. Advised need to avoid stimulating meds as this can slow BMs more in long run. Trial Miralax OTC. Her updated medication list for this problem includes:    Senokot Tabs (Sennosides tabs)   Problem # 5:  OBESITY NOS (ICD-278.00) Councelled on weight loss.  Medications Added to Medication List This Visit: 1)  Kadian 50 Mg Cp24 (Morphine sulfate) .... 50 mg by mouth qam and 30mg  qpm 2)  Dilaudid 2 Mg Tabs (Hydromorphone hcl) .... One by mouth three times daily  Other Orders: T-Lipid Profile ([id number])   Patient Instructions: 1)  Please schedule a follow-up appointment in 6 months. Will callonce results available.

## 2010-10-15 NOTE — Letter (Signed)
Summary: Historic Patient File  Historic Patient File   Imported By: Lind Guest 09/16/2009 09:15:17  _____________________________________________________________________  External Attachment:    Type:   Image     Comment:   External Document

## 2010-10-15 NOTE — Letter (Signed)
Summary: Prescription solutions  Prescription solutions   Imported By: Pablo Lawrence 07/04/2007 16:33:12  _____________________________________________________________________  External Attachment:    Type:   Image     Comment:   External Document

## 2010-10-15 NOTE — Letter (Signed)
Summary: [facility] Office Visits  [facility] Office Visits   Imported By: Lutricia Horsfall LPN 32/95/1884 16:60:63  _____________________________________________________________________  External Attachment:    Type:   Image     Comment:   External Document

## 2010-10-15 NOTE — Letter (Signed)
Summary: Vital Signs Flowsheet  Vital Signs Flowsheet   Imported By: Lutricia Horsfall LPN 13/04/6577 46:96:29  _____________________________________________________________________  External Attachment:    Type:   Image     Comment:   External Document

## 2010-10-15 NOTE — Letter (Signed)
Summary: [employer] verification document  [employer] verification document   Imported By: Pablo Lawrence 06/08/2007 10:40:12  _____________________________________________________________________  External Attachment:    Type:   Image     Comment:   External Document

## 2010-10-15 NOTE — Progress Notes (Signed)
Summary: CHEST CONGESTION  Phone Note Call from Patient   Caller: Patient Call For: AL Summary of Call: PATIENT SAW DR Erby Pian LAST WEEK AND STILL HAS A GREAT DEAL OF CONGESTION IN HER CHEST.  FINISHED LAST DOSE OF PREDNISONE TODAY. PLEASE CALL AND ADVISE [phone] Initial call taken by: Magdalene River,  Jan 31, 2007 10:27 AM  Follow-up for Phone Call        says she is feeling better, but is still congested. 1 day left for antibothic and prednisone. she is taking musinex and increased her water intake, anything else for the congestion Follow-up by: Sonny Dandy,  Jan 31, 2007 10:44 AM  Additional Follow-up for Phone Call Additional follow up Details #1::        Call in script for Proventil HFA 2 puffs every 6 hours - #1 with no refills. Additional Follow-up by: Franchot Heidelberg MD,  Jan 31, 2007 11:18 AM   Additional Follow-up for Phone Call Additional follow up Details #2::    patient already has refill for proventil and she will have it filled  Follow-up by: Sonny Dandy,  Jan 31, 2007 2:01 PM

## 2010-10-15 NOTE — Assessment & Plan Note (Signed)
Summary: six week f/u...cjl   Vital Signs:  Patient Profile:   50 Years Old Female Height:     67 inches (170.18 cm) Weight:      241 pounds BMI:     37.88 O2 Sat:      98 % Temp:     97.4 degrees F Pulse rate:   85 / minute Resp:     14 per minute BP sitting:   127 / 85  Vitals Entered By: Sherilyn Banker (March 13, 2007 1:32 PM)               PCP:  Franchot Heidelberg, MD  Chief Complaint:  follow up visit/ sweats excessively x 2 months and blurry vision.  History of Present Illness: Pt in for recheck.  She was recently seen due to acute bronchitis. This has resolved. She has a long hx of smoking and tobacco chewing. She used Chantix last year and quit smoking - was off for 6 months. States she resumed smoking and does a pack a day. She has done this for two months.  She coughs - white phelgm. She is winded and wheezes. She states she has smoked for 20 years. Se has not had recent spirometry.   She notes she has chest discomfort from coughing. All over lungs. Worse with cough. States pain comes and goes. States feels like glue ripping outwhen she coughs. She has dyspnea and notes not really worse with walking. No orthopnea or PND. She denies leg swelling and fast heart beat.  Risk factors for CAD:  1. Female 2. Menopousal 3. Hyperlipidemic 4. Smoker 5. Obese 6. She denies early fam hx of CAD 7. No hx of HTN.  She sweats all the time. She can sit still with air at 72 degrees. She vaccumes floor and within few minutes she sweats as if there is no tomorrow. She can just sit still and she sweats. States entire body  even - behind her knees. She is irritable. Periods are becoming irregular. Was on Klonopin in the past due to ovulation - out and does not want to see spcialist. She was on Zoloft and Wellbutrin after bck surgery and she was told she would never be the same. Now two years out feels good and does not want to pay 80$ to see her. She does have vaginal dryness. Sweating  happens out of blue. Not irritable. Concentration is good. Has had some hot flashes. States no longer depressed but needs something to calm her mood before her periods.  Now presents.  Lipid Management History:      Positive NCEP/ATP III risk factors include HDL cholesterol less than 40.  Negative NCEP/ATP III risk factors include female age less than 8 years old, no history of early menopause without estrogen hormone replacement, non-diabetic, no family history for ischemic heart disease, non-tobacco-user status, non-hypertensive, no ASHD (atherosclerotic heart disease), no prior stroke/TIA, no peripheral vascular disease, and no history of aortic aneurysm.        The patient states that she knows about the "Therapeutic Lifestyle Change" diet.  Her compliance with the TLC diet is fair.  The patient expresses understanding of adjunctive measures for cholesterol lowering.  Adjunctive measures started by the patient include aerobic exercise, fiber, and omega-3 supplements.     Current Allergies (reviewed today): ! MORPHINE  Past Medical History:    Reviewed history from 11/01/2006 and no changes required:       Anxiety       Depression  Hyperlipidemia       Low back pain - chronic. Pain clinic in [facility] is managing  Past Surgical History:    Reviewed history from 11/01/2006 and no changes required:       Tonsillectomy       Back surgery - L4-L5 fusion   Family History:    Reviewed history from 11/01/2006 and no changes required:       Father: Dead ETOH abuse, DM, Bone Marrow Cancer       Mother: Dead when pt was 86 months old - Pneumonia       Siblings: Sisters x 2 - 52 ETOH abuse and liver cirrhosis, 60 DDD C-spine, Anxiety       Brother 92 - ETOH abuse  Social History:    Reviewed history from 11/01/2006 and no changes required:       Occupation: Disabled fromback       Single       Alcohol use-no       Drug use-no       Former Smoker    Review of Systems      See  HPI   Physical Exam  General:     Well-developed,well-nourished,in no acute distress; alert,appropriate and cooperative throughout examination Lungs:     Normal respiratory effort, chest expands symmetrically. Lungs are clear to auscultation, no crackles or wheezes. Heart:     Normal rate and regular rhythm. S1 and S2 normal without gallop, murmur, click, rub or other extra sounds. Abdomen:     Bowel sounds positive,abdomen soft and non-tender without masses, organomegaly or hernias noted. Extremities:     No clubbing, cyanosis, edema, or deformity noted with normal full range of motion of all joints.   Psych:     Cognition and judgment appear intact. Alert and cooperative with normal attention span and concentration. No apparent delusions, illusions, hallucinations    Impression & Recommendations:  Problem # 1:  CHEST DISCOMFORT, ATYPICAL (ICD-786.59) Suspect related to COPD. EKG done this pm shows normal findings. Will refer for CXR. Spirometry shows: normal findings. Trial Albuterol. Consider asthma in diff for SOB. Orders: EKG w/ Interpretation (93000) T-Chest x-ray, 2 views (71020) Spirometry w/ Graph (94010)   Problem # 2:  SWEATING (ICD-780.8) Discussed. Suspect menopousal. Will check LH and FSH as well as TSH to rule out secondary causes. Councelled we will await results and optomize from there. Orders: T-TSH [phone]) T-CBC w/Diff [phone]) T- * Misc. Laboratory test ([id number]) T-Chest x-ray, 2 views (71020)   Problem # 3:  TOBACCO ABUSE (ICD-305.1) Councelled cessation. Agrees to try. Discussed risk of heart attack, stroke and eary death, not to mention cancer. She is agreeable with Chantix and script will be written. Aware of risk and benefit. Orders: T-Chest x-ray, 2 views ([id number]) Spirometry w/ Graph (94010)  Her updated medication list for this problem includes:    Chantix Starting Month Pak 0.5 Mg X 11 & 1 Mg X 42 Misc (Varenicline tartrate) .[name]... As  directed   Problem # 4:  DEPRESSION (ICD-311) Discussed. She is no longer seeing her Psychiatrist. Would like script for Klonopin prior to period as she gets very aggressive and states it is not pretty. Will give 30 tablets only to track use and get records from her Psychiatrist - Dr. Roe Coombs to see why he used this. Councelled coping skills. Clinically does not appear depressed. Her updated medication list for this problem includes:    Klonopin 1 Mg Tabs (Clonazepam) ..... Once daily  Problem # 5:  OBESITY NOS (ICD-278.00) Councelled diet, exersize and portion control. Recheck 6 weeks.  Medications Added to Medication List This Visit: 1)  Multivitamins Tabs (Multiple vitamin) .... One daily 2)  Senokot Tabs (Sennosides tabs) .... As needed 3)  Proventil 90 Mcg/act Aers (Albuterol) .... Two puffs every 6 hours as needed 4)  Chantix Starting Month Pak 0.5 Mg X 11 & 1 Mg X 42 Misc (Varenicline tartrate) .... As directed  Lipid Assessment/Plan:      Based on NCEP/ATP III, the patient's risk factor category is "0-1 risk factors".  From this information, the patient's calculated lipid goals are as follows: Total cholesterol goal is 200; LDL cholesterol goal is 160; HDL cholesterol goal is 40; Triglyceride goal is 150.  Her LDL cholesterol goal has not been met.     Patient Instructions: 1)  Please schedule a follow-up appointment in 1 month.    Prescriptions: CHANTIX STARTING MONTH PAK 0.5 MG X 11 & 1 MG X 42  MISC (VARENICLINE TARTRATE) As directed  #1 x 0   Entered and Authorized by:   Franchot Heidelberg MD   Signed by:   Franchot Heidelberg MD on 03/13/2007   Method used:   Print then Give to Patient   RxID:   [id number] KLONOPIN 1 MG TABS (CLONAZEPAM) once daily  #30 x 0   Entered and Authorized by:   Franchot Heidelberg MD   Signed by:   Franchot Heidelberg MD on 03/13/2007   Method used:   Print then Give to Patient   RxID:   [id number] PROVENTIL 90 MCG/ACT  AERS  (ALBUTEROL) Two puffs every 6 hours as needed  #1 x 3   Entered and Authorized by:   Franchot Heidelberg MD   Signed by:   Franchot Heidelberg MD on 03/13/2007   Method used:   Print then Give to Patient   RxID:   [id number]

## 2010-10-15 NOTE — Letter (Signed)
Summary: spirometry report  spirometry report   Imported By: Pablo Lawrence 03/13/2007 16:12:29  _____________________________________________________________________  External Attachment:    Type:   Image     Comment:   External Document

## 2010-10-15 NOTE — Letter (Signed)
Summary: Out of Work  [facility]  [address]   [place], [place] [address]   Phone: [phone]  Fax: [phone]    October 29, 2008   Employee:  Albertha ANHAR MCDERMOTT    To Whom It May Concern:   For Medical reasons, please excuse the above named employee from work for the following dates:  Start:   10/29/08  End:   10/30/08  May return 10/31/08  If you need additional information, please feel free to contact our office.         Sincerely,       Franchot Heidelberg MD

## 2010-10-15 NOTE — Assessment & Plan Note (Signed)
Summary: CONGESTION/SLJ   Vital Signs:  Patient Profile:   50 Years Old Female Height:     67 inches (170.18 cm) Weight:      233 pounds BMI:     36.62 O2 Sat:      98 % Temp:     100.4 degrees F Pulse rate:   83 / minute Resp:     14 per minute BP sitting:   135 / 75  Vitals Entered By: Sherilyn Banker LPN (October 29, 2008 9:29 AM)                 PCP:  Franchot Heidelberg, MD  Chief Complaint:  chest congestion, cough, and SOB.  History of Present Illness: Pt in for acute visit.  She has a hx of asthma and states significant other just diagnosed with bronchospasm and acute bronchitis. SHe started with symptoms yesterday. She feels tights in chest and notes SOB on exertion. She is not coughing alot but has begun. She has had fever, chills  and  sweats. She states mucous she hacks u is yellow and tatses bad. She has ithy eyes and ears and states nose runny and has clear liquid. She states did take some Sudafed and this helped. She denies sore thraot byt very scratchy. She states her apetite is fair - not as good as normal. She denies nausea and vomitting. No diarrhea. She denies urinary symptoms. She has only self treated with Sudafed.   She now presents.    Prior Medications Reviewed Using: Patient Recall  Updated Prior Medication List: MULTIVITAMINS  TABS (MULTIPLE VITAMIN) One daily KADIAN 50 MG XR24H-CAP (MORPHINE SULFATE) two times a day SYNTHROID 50 MCG TABS (LEVOTHYROXINE SODIUM) once daily NEURONTIN 300 MG CAPS (GABAPENTIN) two times a day CHANTIX STARTING MONTH PAK 0.5 MG X 11 & 1 MG X 42 TABS (VARENICLINE TARTRATE) As directed CHANTIX CONTINUING MONTH PAK 1 MG TABS (VARENICLINE TARTRATE) As directed  Current Allergies (reviewed today): ! MORPHINE  Past Medical History:    Reviewed history from 05/09/2008 and no changes required:       Current Problems:        ASTHMA, WITH ACUTE EXACERBATION (ICD-493.92)       HYPOTHYROIDISM (ICD-244.9)       DE QUERVAIN'S  TENOSYNOVITIS, RIGHT WRIST (ICD-727.04)       CHEST XRAY, ABNORMAL (ICD-793.1)       WRIST PAIN, RIGHT (ICD-719.43)       ASTHMA (ICD-493.90)       TOBACCO ABUSE (ICD-305.1)       OBESITY NOS (ICD-278.00)       FOLLOW-UP, HIGH RISK TREATMENT NEC (ICD-V67.51)       FIBROIDS, UTERUS (ICD-218.9)       FIBROMYALGIA (ICD-729.1)       CONSTIPATION NOS (ICD-564.00)       LOW BACK PAIN (ICD-724.2)       HYPERLIPIDEMIA (ICD-272.4)       DEPRESSION (ICD-311)       ANXIETY (ICD-300.00)         Past Surgical History:    Reviewed history from 11/01/2006 and no changes required:       Tonsillectomy       Back surgery - L4-L5 fusion   Family History:    Reviewed history from 11/01/2006 and no changes required:       Father: Dead ETOH abuse, DM, Bone Marrow Cancer       Mother: Dead when pt was 67 months old - Pneumonia  Siblings: Sisters x 2 - 52 ETOH abuse and liver cirrhosis, 60 DDD C-spine, Anxiety       Brother 41 - ETOH abuse  Social History:    Reviewed history from 11/01/2006 and no changes required:       Occupation: Disabled fromback       Single       Alcohol use-no       Drug use-no       Former Smoker   Risk Factors: Tobacco use:  current    Smokeless:  Yes Drug use:  no Alcohol use:  no  Family History Risk Factors:    Family History of MI in females < 53 years old:  no    Family History of MI in males < 31 years old:  no  Mammogram History:    Date of Last Mammogram:  05/11/2008  PAP Smear History:    Date of Last PAP Smear:  05/10/2008   Review of Systems      See HPI   Physical Exam  General:     Well-developed,well-nourished,in no acute distress; alert,appropriate and cooperative throughout examination. Tired appearing.  Head:     Normocephalic and atraumatic without obvious abnormalities. No apparent alopecia or balding. Eyes:     No corneal or conjunctival inflammation noted. EOMI. Perrla.  Ears:     External ear exam shows no significant  lesions or deformities.  Otoscopic examination reveals clear canals, tympanic membranes are intact bilaterally without bulging, retraction, inflammation or discharge. Hearing is grossly normal bilaterally. Nose:     Mild congestion, clear,  with minimal turbinate inflammtion Mouth:     Oral mucosa and oropharynx without lesions or exudates.   Neck:     No deformities, masses, or tenderness noted. Lungs:     Decreased airation with in and exp wheeze. Heart:     Normal rate and regular rhythm. S1 and S2 normal without gallop, murmur, click, rub or other extra sounds. Abdomen:     Bowel sounds positive,abdomen soft and non-tender without masses, organomegaly or hernias noted. Extremities:     No clubbing, cyanosis, edema, or deformity noted with normal full range of motion of all joints.   Cervical Nodes:     No lymphadenopathy noted Psych:     Cognition and judgment appear intact. Alert and cooperative with normal attention span and concentration. No apparent delusions, illusions, hallucinations    Impression & Recommendations:  Problem # 1:  BRONCHITIS, ACUTE WITH BRONCHOSPASM (ICD-466.0) Discussed. Asthma and ill contact with dx of above. Advised need to treat similar. Single dose Decadron/Depomderol, start Doxym, push fluids and rest. Advised Proventil inhaler every 6 hours as needed and she adds has nebulizer at home and will do inhaled albuterol as needed. Councelled risk and benefit of abx and steroid use including AVN and cataracts. Aware and agreeable. May cont Nyquil/Dayquil and use Tylenol as needed for fever. Edcuated safe dose and need to avoid excessive acetaminohen gven liver dysfcuntion risk. Delcines neb in  office and will use home Rx. Update if worsne. Agrees. Her updated medication list for this problem includes:    Proventil Hfa 108 (90 Base) Mcg/act Aers (Albuterol sulfate) ..... One inh every 6 hours if needed    Doxycycline Hyclate 100 Mg Tabs (Doxycycline hyclate)  .[name]..[name] Two times a day   Complete Medication List: 1)  Multivitamins Tabs (Multiple vitamin) .... One daily 2)  Kadian 50 Mg Xr24h-cap (Morphine sulfate) .... Two times a day 3)  Synthroid 50  Mcg Tabs (Levothyroxine sodium) .... Once daily 4)  Neurontin 300 Mg Caps (Gabapentin) .... Two times a day 5)  Chantix Starting Month Pak 0.5 Mg X 11 & 1 Mg X 42 Tabs (Varenicline tartrate) .... As directed 6)  Chantix Continuing Month Pak 1 Mg Tabs (Varenicline tartrate) .... As directed 7)  Proventil Hfa 108 (90 Base) Mcg/act Aers (Albuterol sulfate) .... One inh every 6 hours if needed 8)  Doxycycline Hyclate 100 Mg Tabs (Doxycycline hyclate) .... Two times a day   Patient Instructions: 1)  See me as previosuly scheduled - sooner if needed.   Prescriptions: DOXYCYCLINE HYCLATE 100 MG TABS (DOXYCYCLINE HYCLATE) two times a day  #20 x 0   Entered and Authorized by:   Franchot Heidelberg MD   Signed by:   Franchot Heidelberg MD on 10/29/2008   Method used:   Electronically to        [facility] (retail)       [address]       [place], [place]  [address]       Ph: [phone]       Fax: [phone]   RxID:   [id number] PROVENTIL HFA 108 (90 BASE) MCG/ACT AERS (ALBUTEROL SULFATE) One inh every 6 hours if needed  #1 x 3   Entered and Authorized by:   Franchot Heidelberg MD   Signed by:   Franchot Heidelberg MD on 10/29/2008   Method used:   Electronically to        [facility] (retail)       [address]       [place], [place]  [address]       Ph: [phone]       Fax: [phone]   RxID:   [id number]   Appended Document: CONGESTION/SLJ    Clinical Lists Changes  Orders: Added new Service order of Dexamethasone Sodium Phosphate 1mg  (J1100) - Signed Added new Service order of Depo- Medrol 80mg  (J1040) - Signed Added new Service order of Admin of Therapeutic Inj  intramuscular or subcutaneous (73220) -  Signed       Medication Administration  Injection # 1:    Medication: Dexamethasone Sodium Phosphate 1mg     Diagnosis: BRONCHITIS, ACUTE WITH BRONCHOSPASM (ICD-466.0)    Route: IM    Site: L thigh    Exp Date: 06/13/2010    Lot #: [id number]    Mfr: [employer] Regent    Patient tolerated injection without complications    Given by: Sherilyn Banker LPN (October 29, 2008 10:00 AM)  Injection # 2:    Medication: Depo- Medrol 80mg     Diagnosis: BRONCHITIS, ACUTE WITH BRONCHOSPASM (ICD-466.0)    Route: IM    Site: L thigh    Exp Date: 03/14/2011    Lot #: [id number]    Mfr: Pharm[employer]    Patient tolerated injection without complications    Given by: Sherilyn Banker LPN (October 29, 2008 10:01 AM)  Orders Added: 1)  Dexamethasone Sodium Phosphate 1mg  [J1100] 2)  Depo- Medrol 80mg  [J1040] 3)  Admin of Therapeutic Inj  intramuscular or subcutaneous [70623]

## 2010-10-15 NOTE — Assessment & Plan Note (Signed)
Summary: WOULD LIKE TO DISCUSS GETTING LAB WORK DONE/SLJ   Vital Signs:  Patient Profile:   50 Years Old Female Height:     67 inches (170.18 cm) Weight:      230 pounds BMI:     36.15 O2 Sat:      99 % Pulse rate:   80 / minute Resp:     14 per minute BP sitting:   133 / 80  Vitals Entered By: Sherilyn Banker LPN (October 25, 2008 10:24 AM)                 PCP:  Franchot Heidelberg, MD  Chief Complaint:  wants labs done.  History of Present Illness: Pt in for recheck  She notes she does not feel weel. She started menopouse and states going through this is not fun. She is very tired. She has not had energy. She has hypothyroidism and last TSH was 16. Does was adjusted on Rx and never followed up for recheck.   She states her mian concern is her belly. Started burning and states acidy and nauseated in pit. Symptoms for 4 weeks. She states has been taking pepcid and it helps. She denies vomitting. She has not had diarrhea and notes constipated with pain medications. She has not seen bloody stools. She has been quit smoking since last January. She used Chantix and it worked Firefighter. States partner started smoking again and she adds now chewing can and half a day. States thinks this plays big role with stomach. She wats to try Chantix to quit as tolerated well.  She states she wants her BS done. She states she does not pee excessive. She is very thristy all the time. Weight steady. She notes father had DM and worried this is causing her trouble.  She did get flu shot in 09. She has not had H1N1 and declines this. She cont with [facility] for chrinic pain syndrome and had recent Rx adjustments. See med list.   She now presents.  Lipid Management History:      Positive NCEP/ATP III risk factors include HDL cholesterol less than 40 and current tobacco user.  Negative NCEP/ATP III risk factors include female age less than 31 years old, no history of early menopause without  estrogen hormone replacement, non-diabetic, no family history for ischemic heart disease, non-hypertensive, no ASHD (atherosclerotic heart disease), no prior stroke/TIA, no peripheral vascular disease, and no history of aortic aneurysm.        The patient states that she knows about the "Therapeutic Lifestyle Change" diet.  Her compliance with the TLC diet is fair.  The patient expresses understanding of adjunctive measures for cholesterol lowering.  Adjunctive measures started by the patient include fiber.       Prior Medications Reviewed Using: Patient Recall  Updated Prior Medication List: FLEXERIL 10 MG TABS (CYCLOBENZAPRINE HCL) once daily KLONOPIN 1 MG TABS (CLONAZEPAM) once daily MULTIVITAMINS  TABS (MULTIPLE VITAMIN) One daily MS CONTIN 30 MG XR12H-TAB (MORPHINE SULFATE) four times a day DILAUDID 2 MG TABS (HYDROMORPHONE HCL) One by mouth three times daily SYNTHROID 50 MCG TABS (LEVOTHYROXINE SODIUM) once daily  Current Allergies (reviewed today): ! MORPHINE  Past Medical History:    Reviewed history from 05/09/2008 and no changes required:       Current Problems:        ASTHMA, WITH ACUTE EXACERBATION (ICD-493.92)       HYPOTHYROIDISM (ICD-244.9)       DE QUERVAIN'S TENOSYNOVITIS,  RIGHT WRIST (ICD-727.04)       CHEST XRAY, ABNORMAL (ICD-793.1)       WRIST PAIN, RIGHT (ICD-719.43)       ASTHMA (ICD-493.90)       TOBACCO ABUSE (ICD-305.1)       OBESITY NOS (ICD-278.00)       FOLLOW-UP, HIGH RISK TREATMENT NEC (ICD-V67.51)       FIBROIDS, UTERUS (ICD-218.9)       FIBROMYALGIA (ICD-729.1)       CONSTIPATION NOS (ICD-564.00)       LOW BACK PAIN (ICD-724.2)       HYPERLIPIDEMIA (ICD-272.4)       DEPRESSION (ICD-311)       ANXIETY (ICD-300.00)         Past Surgical History:    Reviewed history from 11/01/2006 and no changes required:       Tonsillectomy       Back surgery - L4-L5 fusion   Family History:    Reviewed history from 11/01/2006 and no changes required:        Father: Dead ETOH abuse, DM, Bone Marrow Cancer       Mother: Dead when pt was 47 months old - Pneumonia       Siblings: Sisters x 2 - 52 ETOH abuse and liver cirrhosis, 60 DDD C-spine, Anxiety       Brother 48 - ETOH abuse  Social History:    Reviewed history from 11/01/2006 and no changes required:       Occupation: Disabled fromback       Single       Alcohol use-no       Drug use-no       Former Smoker   Risk Factors:  Tobacco use:  current    Smokeless:  Yes -- 2 cans per day    Counseled to quit/cut down tobacco use:  yes Drug use:  no Alcohol use:  no  Family History Risk Factors:    Family History of MI in females < 25 years old:  no    Family History of MI in males < 33 years old:  no  Mammogram History:     Date of Last Mammogram:  05/11/2008    Results:  normal   PAP Smear History:     Date of Last PAP Smear:  05/10/2008    Results:  normal    Review of Systems      See HPI  General      Complains of fatigue and malaise.      Denies chills, fever, and sweats.  CV      Denies chest pain or discomfort, swelling of feet, swelling of hands, and weight gain.  Resp      Denies cough, shortness of breath, sputum productive, and wheezing.  GI      Denies abdominal pain, constipation, diarrhea, nausea, and vomiting.  GU      Denies nocturia, urinary frequency, and urinary hesitancy.      Saw GYN and reassured about fibroids. See note EMR.  Psych      Denies anxiety and depression.   Physical Exam  General:     Well-developed,well-nourished,in no acute distress; alert,appropriate and cooperative throughout examination. Tired appearing.  Lungs:     Normal respiratory effort, chest expands symmetrically. Lungs are clear to auscultation, no crackles or wheezes. Heart:     Normal rate and regular rhythm. S1 and S2 normal without gallop, murmur, click, rub or other extra sounds. Abdomen:  Bowel sounds positive,abdomen soft and non-tender without  masses, organomegaly or hernias noted. Extremities:     No clubbing, cyanosis, edema, or deformity noted with normal full range of motion of all joints.   Cervical Nodes:     No lymphadenopathy noted Psych:     Cognition and judgment appear intact. Alert and cooperative with normal attention span and concentration. No apparent delusions, illusions, hallucinations    Impression & Recommendations:  Problem # 1:  MALAISE AND FATIGUE (ICD-780.79) Discussed. Suspect related to hypthryodisim. HGB  and ranom BS normal and reassured. Await lab and optomize. Agrees. Consider menopiusse related depression in diff as well.  Orders: Capillary Blood Glucose (82948) Hgb (16109)   Problem # 2:  HYPOTHYROIDISM (ICD-244.9) See above. Her updated medication list for this problem includes:    Synthroid 50 Mcg Tabs (Levothyroxine sodium) ..... Once daily  Orders: T-TSH (60454-09811)   Problem # 3:  TOBACCO ABUSE (ICD-305.1) Councelled to quit chewing as symptoms of GI upset likley related to this. Trial Chantix. Advised risk and benefit. Advised method of use. Well aware as successfully used to quit smoking in past. Update if any concerns. Her updated medication list for this problem includes:    Chantix Starting Month Pak 0.5 Mg X 11 & 1 Mg X 42 Tabs (Varenicline tartrate) .[name]... As directed    Chantix Continuing Month Pak 1 Mg Tabs (Varenicline tartrate) .[name]... As directed   Problem # 4:  FIBROIDS, UTERUS (ICD-218.9) See note GYN. Yearly exam in August.  Problem # 5:  HYPERLIPIDEMIA (ICD-272.4) TLC diet only.  Problem # 6:  GASTRITIS (ICD-535.50) Trial Kapidex with 4 weeks samples given. Quit chewing. If symptoms persist, do rectal, check CMP, amylase, lipase, H. Pylori and consider EGD.   Complete Medication List: 1)  Multivitamins Tabs (Multiple vitamin) .... One daily 2)  Kadian 50 Mg Xr24h-cap (Morphine sulfate) .... Two times a day 3)  Synthroid 50 Mcg Tabs (Levothyroxine sodium) ....  Once daily 4)  Neurontin 300 Mg Caps (Gabapentin) .... Two times a day 5)  Chantix Starting Month Pak 0.5 Mg X 11 & 1 Mg X 42 Tabs (Varenicline tartrate) .... As directed 6)  Chantix Continuing Month Pak 1 Mg Tabs (Varenicline tartrate) .... As directed  Lipid Assessment/Plan:      Based on NCEP/ATP III, the patient's risk factor category is "2 or more risk factors and a calculated 10 year CAD risk of < 20%".  From this information, the patient's calculated lipid goals are as follows: Total cholesterol goal is 200; LDL cholesterol goal is 130; HDL cholesterol goal is 40; Triglyceride goal is 150.  Her LDL cholesterol goal has not been met.     Patient Instructions: 1)  Please schedule a follow-up appointment in 1 month.   Prescriptions: CHANTIX CONTINUING MONTH PAK 1 MG TABS (VARENICLINE TARTRATE) As directed  #1 x 1   Entered and Authorized by:   Franchot Heidelberg MD   Signed by:   Franchot Heidelberg MD on 10/25/2008   Method used:   Electronically to        [facility] (retail)       [address]       [place], [place]  [address]       Ph: [phone]       Fax: [phone]   RxID:   [id number] CHANTIX STARTING MONTH PAK 0.5 MG X 11 & 1 MG X 42 TABS (VARENICLINE TARTRATE)  As directed  #1 x 0   Entered and Authorized by:   Franchot Heidelberg MD   Signed by:   Franchot Heidelberg MD on 10/25/2008   Method used:   Electronically to        [facility] (retail)       [address]       [place], [place]  [address]       Ph: [phone]       Fax: [phone]   RxID:   [id number]   Laboratory Results   Blood Tests     Glucose (random): 104 mg/dL   (Normal Range: 89-381)  CBC HGB:  15.3 g/dL   (Normal Range: 01.7-51.0 in Males, 12.0-15.0 in Females)      Preventive Care Screening  Mammogram:    Date:  05/11/2008    Next Due:  05/2009    Results:  normal   Pap Smear:    Date:  05/10/2008     Next Due:  05/2009    Results:  normal   Last Pneumovax:    Next Due:  07/2012  Last Flu Shot:    Date:  10/25/2008    Next Due:  10/2009    Results:  Hx of

## 2010-10-15 NOTE — Letter (Signed)
Summary: Letter to pt IE lab results  Letter to pt IE lab results   Imported By: Pablo Lawrence 04/14/2007 08:24:33  _____________________________________________________________________  External Attachment:    Type:   Image     Comment:   External Document

## 2010-10-15 NOTE — Assessment & Plan Note (Signed)
Summary: To go over test results   Vital Signs:  Patient Profile:   50 Years Old Female Height:     67 inches (170.18 cm) Weight:      227 pounds BMI:     35.68 O2 Sat:      100 % Temp:     97.6 degrees F Pulse rate:   80 / minute Resp:     14 per minute BP sitting:   111 / 76  Vitals Entered By: Sherilyn Banker (June 06, 2007 3:52 PM)                 PCP:  Franchot Heidelberg, MD  Chief Complaint:  labs follow up.  History of Present Illness: Pt in for recheck.  She was recently seen due to shortness of breath. She had normal spiromtry and was placed on Albuterol. She notes it has made a big difference. She uses two puffs  up to five times daily. Curious about adding Adviar or Symbicort.  Was on Advair in past and helped. Would like to try this again. She has minimal cough.Juy=ts a little tight in chest. She cont to smoke - pack a day. Going to hold offon Chantix as she has set quit date for January 2009.  CXR is reviewed frommlast visit:  Mild chronic bronchitic changes. No acute abnormalities. Nonspecific linear high attenuation focus in right axilla, question  internal versus external foreign body.  She chops wood and carries it. She has no arm pain and states she did notice a few weekas ago a slight itch. Scratched it and felt pop. Hand was wet. Adds stepped on nail a few weeks ago.Has healed but has not had TD in over ten years.  She has right wrist pain. States pain is sharp. Worse with opening jar. States pain is aching. On pain meds and tried wrist brace. States pain back after stops.  She had recent labs and this is reviewed:  1. CBC - normal 2. TSH - 8.3 3. LH and FSH - unremarkable  Always tired and worn out. Would like trial of Synthroid. Moody.Tired. Dry skin.Constipated.  She is eating healthier. She is exersizinjg a bit more and she is going to start exersizing by swimming. Down 14 pounds since last visit.  Now presents.  Current Allergies  (reviewed today): ! MORPHINE  Past Medical History:    Reviewed history from 11/01/2006 and no changes required:       Anxiety       Depression       Hyperlipidemia       Low back pain - chronic. Pain clinic in [facility] is managing  Past Surgical History:    Reviewed history from 11/01/2006 and no changes required:       Tonsillectomy       Back surgery - L4-L5 fusion   Family History:    Reviewed history from 11/01/2006 and no changes required:       Father: Dead ETOH abuse, DM, Bone Marrow Cancer       Mother: Dead when pt was 52 months old - Pneumonia       Siblings: Sisters x 2 - 52 ETOH abuse and liver cirrhosis, 60 DDD C-spine, Anxiety       Brother 64 - ETOH abuse  Social History:    Reviewed history from 11/01/2006 and no changes required:       Occupation: Disabled fromback       Single  Alcohol use-no       Drug use-no       Former Smoker    Review of Systems      See HPI  General      Complains of fatigue and malaise.   Physical Exam  General:     Well-developed,well-nourished,in no acute distress; alert,appropriate and cooperative throughout examination Lungs:     Normal respiratory effort, chest expands symmetrically. Lungs are clear to auscultation, no crackles or wheezes. Heart:     Normal rate and regular rhythm. S1 and S2 normal without gallop, murmur, click, rub or other extra sounds. Abdomen:     Bowel sounds positive,abdomen soft and non-tender without masses, organomegaly or hernias noted. Extremities:     No clubbing, cyanosis, edema, or deformity noted with normal full range of motion of all joints.  Tender right wrist bwith possible finkelstein test. Cervical Nodes:     No lymphadenopathy noted Axillary Nodes:     No palpable lymphadenopathy Psych:     Cognition and judgment appear intact. Alert and cooperative with normal attention span and concentration. No apparent delusions, illusions, hallucinations    Impression &  Recommendations:  Problem # 1:  THYROID STIMULATING HORMONE, ABNORMAL (ICD-246.9) Discussed. Based on sx will start Synthroid as these suggest hypothyroidism. Advised recheck 6 weeks - start low dose and optomize.  Problem # 2:  OBESITY NOS (ICD-278.00) Congratulated on weight loss. Advised diet, exersize and portion control. Follow.  Problem # 3:  ASTHMA (ICD-493.90) Stable. Proventil as is. Given frequency of use add Advair at 100/50 two times a day. Councelled smoking cessation and advised peakflow. Not open to this.  Her updated medication list for this problem includes:    Proventil 90 Mcg/act Aers (Albuterol) .[name]..[name] Two puffs every 6 hours as needed   Problem # 4:  WRIST PAIN, RIGHT (ICD-719.43) Disucssed. Suspect tendonitits. Trial Naproxen 500 mg two times a day for 7 days. Use wrist barce. if persist, needs steroid injection.  Problem # 5:  TOBACCO ABUSE (ICD-305.1) Councelled cessation. Advised need to quit. Aware of risk and benefit. Has set quit date for January 2009. Chantix as directed. Her updated medication list for this problem includes:    Chantix Starting Month Pak 0.5 Mg X 11 & 1 Mg X 42 Misc (Varenicline tartrate) .[name]... As directed   Problem # 6:  CHEST XRAY, ABNORMAL (ICD-793.1) Reassured about area in axilla. Exam normal here. Follow.  Problem # 7:  Preventive Health Care (ICD-V70.0) TD updated given hx of nail puncture wound. Needs flu and pneumovax next visit. Agrees. Orders: Admin 1st Vaccine (04540) Tdap => 52yrs IM (98119)   Complete Medication List: 1)  Flexeril 10 Mg Tabs (Cyclobenzaprine hcl) .... Once daily 2)  Klonopin 1 Mg Tabs (Clonazepam) .... Once daily 3)  Multivitamins Tabs (Multiple vitamin) .... One daily 4)  Senokot Tabs (Sennosides tabs) .... As needed 5)  Kadian 50 Mg Cp24 (Morphine sulfate) .... 50 mg by mouth qam and 30mg  qpm 6)  Dilaudid 2 Mg Tabs (Hydromorphone hcl) .... One by mouth three times daily 7)  Proventil 90 Mcg/act Aers  (Albuterol) .... Two puffs every 6 hours as needed 8)  Chantix Starting Month Pak 0.5 Mg X 11 & 1 Mg X 42 Misc (Varenicline tartrate) .... As directed 9)  Synthroid 25 Mcg Tabs (Levothyroxine sodium) .... One daily   Patient Instructions: 1)  Please schedule a follow-up appointment in 6 weeks.    Prescriptions: SYNTHROID 25 MCG  TABS (LEVOTHYROXINE  SODIUM) One daily  #30 x 3   Entered and Authorized by:   Franchot Heidelberg MD   Signed by:   Franchot Heidelberg MD on 06/06/2007   Method used:   Print then Give to Patient   RxID:   [id number]  ]   Tetanus/Td Immunization History:    Tetanus/Td # 1:  Tdap (06/06/2007)

## 2010-10-15 NOTE — Miscellaneous (Signed)
  Clinical Lists Changes  Medications: Changed medication from ADVAIR DISKUS 100-50 MCG/DOSE  MISC (FLUTICASONE-SALMETEROL) 1 puff per day as needed to ADVAIR DISKUS 100-50 MCG/DOSE  MISC (FLUTICASONE-SALMETEROL) two times a day

## 2010-10-15 NOTE — Assessment & Plan Note (Signed)
Summary: 2 WEEK FOLLOW UP/ARC   Vital Signs:  Patient profile:   50 year old female Height:      67 inches Weight:      226 pounds BMI:     35.52 O2 Sat:      97 % Pulse rate:   82 / minute Resp:     14 per minute BP sitting:   130 / 89  Vitals Entered By: Sherilyn Banker LPN (April 08, 2009 1:46 PM) CC: follow-up visit, Lipid Management   Primary Provider:  Franchot Heidelberg, MD  CC:  follow-up visit and Lipid Management.  History of Present Illness: Pt in for recheck.  She was seen two weeks ao with depression and anxiety. She has hx of same and and states Rx has helped. She was started on Venlafaxine. She denies side-effects on Rx and states since starting medicine has slept better. She is less irritable, sense that life is just to much is clearing. She denies suicidal ideations. Energy is picking up. She states main stressor is just life in general and notes with rx start better able to just let things go. She states Klonopin made her real sleepy. She thus stopped use and sttes doing fine.  She had labs and this si reviewed:  1.  CBC - normal 2. TSH - normal  She had an asthma flare ans states a little better. She is uising Provntil and has to take every 4 hours. She has home  nebulizer and has not used. She states thinks hot humid weather doing this. Denies cough and notes wheezing and sense of tightness. States steroid heloped a lot but then wore off. She hates steroids but states will do what it takes. She has some Advair at home but stopped use. Not sure why.   She now presents.   Lipid Management History:      Positive NCEP/ATP III risk factors include HDL cholesterol less than 40 and current tobacco user.  Negative NCEP/ATP III risk factors include female age less than 35 years old, no history of early menopause without estrogen hormone replacement, non-diabetic, no family history for ischemic heart disease, non-hypertensive, no ASHD (atherosclerotic heart disease), no prior  stroke/TIA, no peripheral vascular disease, and no history of aortic aneurysm.        The patient states that she does not know about the "Therapeutic Lifestyle Change" diet.  Her compliance with the TLC diet is fair.    Current Problems (verified): 1)  Asthma, With Acute Exacerbation  (ICD-493.92) 2)  Gastritis  (ICD-535.50) 3)  Malaise and Fatigue  (ICD-780.79) 4)  Menorrhagia  (ICD-626.2) 5)  Asthma, Mild  (ICD-493.90) 6)  Hypothyroidism  (ICD-244.9) 7)  Hx of Tobacco Abuse  (ICD-305.1) 8)  Obesity Nos  (ICD-278.00) 9)  Follow-up, High Risk Treatment Nec  (ICD-V67.51) 10)  Fibroids, Uterus  (ICD-218.9) 11)  Fibromyalgia  (ICD-729.1) 12)  Constipation Nos  (ICD-564.00) 13)  Low Back Pain  (ICD-724.2) 14)  Hyperlipidemia  (ICD-272.4) 15)  Depression  (ICD-311) 16)  Anxiety  (ICD-300.00)  Current Medications (verified): 1)  Multivitamins  Tabs (Multiple Vitamin) .... One Daily 2)  Kadian 50 Mg Xr24h-Cap (Morphine Sulfate) .... Two Times A Day 3)  Synthroid 50 Mcg Tabs (Levothyroxine Sodium) .... Once Daily 4)  Neurontin 300 Mg Caps (Gabapentin) .... Two Times A Day 5)  Proventil Hfa 108 (90 Base) Mcg/act Aers (Albuterol Sulfate) .... One Inh Every 6 Hours If Needed 6)  Venlafaxine Hcl 75 Mg Xr24h-Tab (Venlafaxine Hcl) .Marland KitchenMarland KitchenMarland Kitchen  Taper Pack Provided As Sample  Allergies (verified): 1)  ! Morphine  Past History:  Past Medical History: Last updated: 05/09/2008 Current Problems:  ASTHMA, WITH ACUTE EXACERBATION (ICD-493.92) HYPOTHYROIDISM (ICD-244.9) DE QUERVAIN'S TENOSYNOVITIS, RIGHT WRIST (ICD-727.04) CHEST XRAY, ABNORMAL (ICD-793.1) WRIST PAIN, RIGHT (ICD-719.43) ASTHMA (ICD-493.90) TOBACCO ABUSE (ICD-305.1) OBESITY NOS (ICD-278.00) FOLLOW-UP, HIGH RISK TREATMENT NEC (ICD-V67.51) FIBROIDS, UTERUS (ICD-218.9) FIBROMYALGIA (ICD-729.1) CONSTIPATION NOS (ICD-564.00) LOW BACK PAIN (ICD-724.2) HYPERLIPIDEMIA (ICD-272.4) DEPRESSION (ICD-311) ANXIETY (ICD-300.00)  Past Surgical  History: Last updated: 11/01/2006 Tonsillectomy Back surgery - L4-L5 fusion  Family History: Last updated: March 29, 2009 Father: Dead age 23 ETOH abuse, DM, Bone Marrow Cancer Mother: Dead  at 53 when pt was 49 months old - Pneumonia Siblings: Sisters x 2 - 55 dead ETOH abuse and liver cirrhosis and 65 DDD C-spine, Anxiety Brother 53 - ETOH abuse Kids - none  Social History: Last updated: 03/29/2009 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 Resp:  See HPI. GI:  Denies abdominal pain, constipation, diarrhea, nausea, and vomiting. GU:  Denies nocturia, urinary frequency, and urinary hesitancy. Psych:  See HPI.  Physical Exam  General:  Well-developed,well-nourished,in no acute distress; alert,appropriate and cooperative throughout examination. Tired appearing.  Lungs:  Decreased airation with in and exp wheeze. Heart:  Normal rate and regular rhythm. S1 and S2 normal without gallop, murmur, click, rub or other extra sounds. Abdomen:  Bowel sounds positive,abdomen soft and non-tender without masses, organomegaly or hernias noted. Extremities:  No clubbing, cyanosis, edema, or deformity noted with normal full range of motion of all joints.   Cervical Nodes:  No lymphadenopathy noted Psych:  Cognition and judgment appear intact. Alert and cooperative with normal attention span and concentration. No apparent delusions, illusions, hallucinations   Impression & Recommendations:  Problem # 1:  ASTHMA, WITH ACUTE EXACERBATION (ICD-493.92) Get CXR given persistance of symptoms and hx of tobacco abuse. Advised risk and benefit. Use Proventil every 4 to  hours as needed and add medrol pack.  Resume Advair and advised maintenance Rx. Push fluids, rest, avoid excessive outdoor temps and update if worsen. Agrees. Councelled risk and benefit of Rx. Aware and agreeable. Her updated medication list for this problem includes:    Proventil Hfa 108 (90 Base) Mcg/act Aers (Albuterol sulfate) ..... One inh every 6 hours if needed    Advair Diskus 250-50 Mcg/dose Misc (Fluticasone-salmeterol) ..... One inh two times a day    Medrol (pak) 4 Mg Tabs (Methylprednisolone) .[name]... As directed  Orders: Radiology Referral (Radiology)  Problem # 2:  DEPRESSION (ICD-311) Much improved. DC klonoping due to sedation. Cont Venlafaxine as is and recheck 4 weeks. Script sent, will do PA if needed. Councelled stress coping skills. The following medications were removed from the medication list:    Klonopin 0.5 Mg Tabs (Clonazepam) .[name]..[name] Two times a day as needed Her updated medication list for this problem includes:    Venlafaxine Hcl 75 Mg Xr24h-tab (Venlafaxine hcl) ..... One daily  Problem # 3:  HYPOTHYROIDISM (ICD-244.9) Stable. Rx as is. Suspect malaise and fatigue secondary to depression and this is actually improved today. Follow. Her updated medication list for this problem includes:    Synthroid 50 Mcg Tabs (Levothyroxine sodium) ..... Once daily  Problem # 4:  Preventive Health Care (ICD-V70.0) Well woman exam in 4 weeks.  Complete Medication List: 1)  Multivitamins Tabs (Multiple vitamin) .... One daily 2)  Kadian 50 Mg Xr24h-cap (Morphine sulfate) .... Two times a day 3)  Synthroid 50 Mcg Tabs (Levothyroxine sodium) .... Once daily 4)  Neurontin 300 Mg Caps (Gabapentin) .... Two times a day 5)  Proventil Hfa 108 (90 Base) Mcg/act Aers (Albuterol sulfate) .... One inh every 6 hours if needed 6)  Venlafaxine Hcl 75 Mg Xr24h-tab (Venlafaxine hcl) .... One daily 7)  Advair Diskus 250-50 Mcg/dose Misc (Fluticasone-salmeterol) .... One inh two times a day 8)  Medrol (pak) 4 Mg Tabs  (Methylprednisolone) .... As directed  Lipid Assessment/Plan:      Based on NCEP/ATP III, the patient's risk factor category is "2 or more risk factors and a calculated 10 year CAD risk of < 20%".  The patient's lipid goals are as follows: Total cholesterol goal is 200; LDL cholesterol goal is 130; HDL cholesterol goal is 40; Triglyceride goal is 150.  Her LDL cholesterol goal has not been met.     Patient Instructions: 1)  Please schedule a follow-up appointment in 1 month for well woman exam. Prescriptions: VENLAFAXINE HCL 75 MG XR24H-TAB (VENLAFAXINE HCL) One daily  #30 x 3   Entered and Authorized by:   Franchot Heidelberg MD   Signed by:   Franchot Heidelberg MD on 04/08/2009   Method used:   Electronically to        [facility]* (retail)       [address]       [place], [place]  [address]       Ph: [phone]       Fax: [phone]   RxID:   [id number] MEDROL (PAK) 4 MG TABS (METHYLPREDNISOLONE) As directed  #1 x 0   Entered and Authorized by:   Franchot Heidelberg MD   Signed by:   Franchot Heidelberg MD on 04/08/2009   Method used:   Electronically to        [facility]* (retail)       [address]       [place], [place]  [address]       Ph: [phone]       Fax: [phone]   RxID:   [id number] ADVAIR DISKUS 250-50 MCG/DOSE MISC (FLUTICASONE-SALMETEROL) One inh two times a day  #1 x 3   Entered and Authorized by:   Franchot Heidelberg MD   Signed by:   Franchot Heidelberg MD on 04/08/2009   Method used:   Electronically to        [facility]* (retail)       [address]       [place], [place]  [address]       Ph: [phone]       Fax: [phone]   RxID:   [id number]

## 2010-10-15 NOTE — Progress Notes (Signed)
Summary: wants refill  Phone Note Call from Patient   Summary of Call: patient is requesting a refill on her abuterol 90mg  and a Rx for Advair. She was given a sample of Advair at her last visit and states it is really helping...  [facility] Initial call taken by: Donneta Romberg,  June 26, 2007 2:10 PM  Follow-up for Phone Call        both scripts called into [facility] Follow-up by: Sherilyn Banker,  June 26, 2007 2:42 PM    New/Updated Medications: ADVAIR DISKUS 100-50 MCG/DOSE  MISC (FLUTICASONE-SALMETEROL) 1 puff per day as needed      Appended Document: wants refill samples given until meds preauthorized.

## 2010-10-15 NOTE — Assessment & Plan Note (Signed)
Summary: Asthma and nerves/arc   Vital Signs:  Patient profile:   50 year old female Height:      67 inches Weight:      232 pounds BMI:     36.47 O2 Sat:      95 % Temp:     98.0 degrees F Pulse rate:   72 / minute Resp:     14 per minute BP sitting:   134 / 74  Vitals Entered By: Sherilyn Banker LPN (March 26, 2009 3:00 PM) CC: Recheck   Primary Provider:  Franchot Heidelberg, MD  CC:  Recheck.  History of Present Illness: Pt in for recheck.  She notes she is doing well.  She has asthma and with bad weather has had some dfficulty. She needs refill on inhaler. She states has used this 4 times daily lately due to heat and humidity. She feels winded and has been wheezing off and on. She denies cough and denies sputum. She denies fever, chills and sweats. She denies sore throat, runny niose and ear pain. She notes no chest pain and states just tight behind strnum. Desrcibes as warm poker sensation.Adds very scared as friend's MD told her high risk for cancer first year post quitting.  She is curious about xray.  She quit smoking 18 months ago.  She has fair apetite. Deiesnausea and vomiting. Denies diarrhea and constipation. No urinary symptoms.   She has not had ill contacts or travel. She has not self treated exept for the above.  She is depressed. She was seeing Dr. Lafayette Dragon in [place] but states $185 per visit as he does not take insurance. Has not seen in year and states off Rx since then. She is more anxious, irritable and tired. She states she is sleeping poorly. She is struggling with her chornic pain. States back doing fair right now. Always tired. Does not enjoy life as she should. Main stressor now money. She has good support in SO and friends. She has used Lexapro and it killed her sexual desire. She states has tried Wellbutrin and cannot recall what this did. Also used Klonopin for while and it helped some to calm her jitters. States cannot recall what ele she has tried. She is trying  to find Psyhiatrist at this time.  She now presents.  Current Problems (verified): 1)  Gastritis  (ICD-535.50) 2)  Menorrhagia  (ICD-626.2) 3)  Asthma, Mild  (ICD-493.90) 4)  Hypothyroidism  (ICD-244.9) 5)  Tobacco Abuse  (ICD-305.1) 6)  Obesity Nos  (ICD-278.00) 7)  Follow-up, High Risk Treatment Nec  (ICD-V67.51) 8)  Fibroids, Uterus  (ICD-218.9) 9)  Fibromyalgia  (ICD-729.1) 10)  Constipation Nos  (ICD-564.00) 11)  Low Back Pain  (ICD-724.2) 12)  Hyperlipidemia  (ICD-272.4) 13)  Depression  (ICD-311) 14)  Anxiety  (ICD-300.00)  Current Medications (verified): 1)  Multivitamins  Tabs (Multiple Vitamin) .... One Daily 2)  Kadian 50 Mg Xr24h-Cap (Morphine Sulfate) .... Two Times A Day 3)  Synthroid 50 Mcg Tabs (Levothyroxine Sodium) .... Once Daily 4)  Neurontin 300 Mg Caps (Gabapentin) .... Two Times A Day 5)  Proventil Hfa 108 (90 Base) Mcg/act Aers (Albuterol Sulfate) .... One Inh Every 6 Hours If Needed  Allergies (verified): 1)  ! Morphine  Past History:  Past Medical History: Last updated: 05/09/2008 Current Problems:  ASTHMA, WITH ACUTE EXACERBATION (ICD-493.92) HYPOTHYROIDISM (ICD-244.9) DE QUERVAIN'S TENOSYNOVITIS, RIGHT WRIST (ICD-727.04) CHEST XRAY, ABNORMAL (ICD-793.1) WRIST PAIN, RIGHT (ICD-719.43) ASTHMA (ICD-493.90) TOBACCO ABUSE (ICD-305.1) OBESITY NOS (ICD-278.00) FOLLOW-UP,  HIGH RISK TREATMENT NEC (ICD-V67.51) FIBROIDS, UTERUS (ICD-218.9) FIBROMYALGIA (ICD-729.1) CONSTIPATION NOS (ICD-564.00) LOW BACK PAIN (ICD-724.2) HYPERLIPIDEMIA (ICD-272.4) DEPRESSION (ICD-311) ANXIETY (ICD-300.00)  Past Surgical History: Last updated: 11/01/2006 Tonsillectomy Back surgery - L4-L5 fusion  Family History: Last updated: 2009/04/04 Father: Dead age 49 ETOH abuse, DM, Bone Marrow Cancer Mother: Dead  at 64 when pt was 58 months old - Pneumonia Siblings: Sisters x 2 - 55 dead ETOH abuse and liver cirrhosis and 65 DDD C-spine, Anxiety Brother 66 - ETOH  abuse Kids - none  Social History: Last updated: 04/04/2009 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 CV:  See HPI. Resp:  See HPI. GI:  See HPI. GU:  See HPI. Psych:  See HPI.  Physical Exam  General:  Well-developed,well-nourished,in no acute distress; alert,appropriate and cooperative throughout examination. Tired appearing.  Lungs:  Decreased airation with in and exp wheeze. Heart:  Normal rate and regular rhythm. S1 and S2 normal without gallop, murmur, click, rub or other extra sounds. Abdomen:  Bowel sounds positive,abdomen soft and non-tender without masses, organomegaly or hernias noted. Extremities:  No clubbing, cyanosis, edema, or deformity noted with normal full range of motion of all joints.   Psych:  Cognition and judgment appear intact. Alert and cooperative with normal attention span and concentration. No apparent delusions, illusions, hallucinations   Impression & Recommendations:  Problem # 1:  ASTHMA, WITH ACUTE EXACERBATION (ICD-493.92)  Single dose Decadron/Depomedrol today. Advised risk and benefit and agreeable. Cont Proventil. Avoid hot humid days outdoors. Update if  worsen. Discussed CXR and advised certainly can justify basedon symptoms. Then states does not wnat this. Follow and if signs persist, obtain. Recheck 2 weeks. Consider peakflow, spirometry as high risk for COPD develoment. Her updated medication list for this problem includes:    Proventil Hfa 108 (90 Base) Mcg/act Aers (Albuterol sulfate) ..... One inh every 6 hours if needed  Orders: Depo- Medrol 80mg  (J1040) Admin of Therapeutic Inj  intramuscular or subcutaneous (16109) Dexamethasone Sodium Phosphate 1mg  (J1100)  Problem # 2:  Hx of TOBACCO ABUSE (ICD-305.1) Happy with cessation. Urged to stay course. Agrees. The following medications were removed from the medication list:    Chantix Starting Month Pak 0.5 Mg X 11 & 1 Mg X 42 Tabs (Varenicline tartrate) .[name]... As directed    Chantix Continuing Month Pak 1 Mg Tabs (Varenicline tartrate) .[name]... As directed  Problem # 3:  DEPRESSION (ICD-311)  Hx of same. Dr. Lafayette Dragon does not take insurance. Refer to Dr. Lolly Mustache. Start trial Klonopin to aide in acute anxiety symptoms and add Velnafaxine 75 mg daily - samples given with starter pack and taper. Explained proper use and advised risk and benefit including sexual dysfucntion and  suicide. Recheck two weeks. Sooner if needed. Her updated medication list for this problem includes:    Klonopin 0.5 Mg Tabs (Clonazepam) .[name]..[name] Two times a day as needed    Venlafaxine Hcl 75 Mg Xr24h-tab (Venlafaxine hcl) .[name]... Taper pack provided as sample  Orders: Psychiatric Referral (Psych)  Problem # 4:  MALAISE AND FATIGUE (ICD-780.79) She has no energy. Check CBC and TSH and optomize. Likley secondary to the above. Orders: T-CBC w/Diff ([id number]) T-TSH [phone])  Complete Medication List: 1)  Multivitamins Tabs (Multiple vitamin) .... One daily 2)  Kadian 50 Mg Xr24h-cap (Morphine sulfate) .... Two times a day 3)  Synthroid 50 Mcg Tabs (Levothyroxine sodium) .... Once daily 4)  Neurontin 300 Mg Caps  (Gabapentin) .... Two times a day 5)  Proventil Hfa 108 (90 Base) Mcg/act Aers (Albuterol sulfate) .... One inh every 6 hours if needed 6)  Klonopin 0.5 Mg Tabs (Clonazepam) .... Two times a day as needed 7)  Venlafaxine Hcl 75 Mg Xr24h-tab (Venlafaxine hcl) .... Taper pack provided as sample  Patient Instructions: 1)  Please schedule a follow-up appointment in 2 weeks. Sooner if worse. Prescriptions: KLONOPIN 0.5 MG TABS (CLONAZEPAM) two times a day as needed  #60 x 0   Entered and Authorized by:   Franchot Heidelberg MD   Signed by:   Franchot Heidelberg MD on 03/26/2009   Method used:   Print then Give to Patient   RxID:   [id number] PROVENTIL HFA 108 (90 BASE) MCG/ACT AERS (ALBUTEROL SULFATE) One inh every 6 hours if needed  #1 x 3   Entered and Authorized by:   Franchot Heidelberg MD   Signed by:   Franchot Heidelberg MD on 03/26/2009   Method used:   Print then Give to Patient   RxID:   [id number]    Medication Administration  Injection # 1:    Medication: Depo- Medrol 80mg     Diagnosis: ASTHMA, WITH ACUTE EXACERBATION (HKV-425.95)    Route: IM    Site: L deltoid    Exp Date: 03/14/2011    Lot #: [id number]    Mfr: Pharmacia    Patient tolerated injection without complications    Given by: Sherilyn Banker LPN (March 26, 2009 4:10 PM)  Injection # 2:    Medication: Dexamethasone Sodium Phosphate 1mg     Diagnosis: ASTHMA, WITH ACUTE EXACERBATION (GLO-756.43)    Route: IM    Site: L deltoid    Exp Date: 06/13/2010    Lot #: [id number]    Mfr: [employer] Regent    Patient tolerated injection without complications    Given by: Sherilyn Banker LPN (March 26, 2009 4:10 PM)  Orders Added: 1)  T-CBC w/Diff [[id number]] 2)  T-TSH [[id number]] 3)  Est. Patient Level IV [55732] 4)  Psychiatric Referral [Psych] 5)  Depo- Medrol 80mg  [J1040] 6)  Admin of Therapeutic Inj  intramuscular or subcutaneous [96372] 7)  Dexamethasone Sodium Phosphate 1mg  [J1100]  Appended Document: Asthma and  nerves/arc 03/26/09 psychiatric referral faxed to [facility].

## 2010-10-15 NOTE — Letter (Signed)
Summary: [facility]   Imported By: Curtis Sites 07/29/2008 16:57:26  _____________________________________________________________________  External Attachment:    Type:   Image     Comment:   External Document

## 2010-10-15 NOTE — Assessment & Plan Note (Signed)
Summary: chest/head cold,trouble breathing   Vital Signs:  Patient Profile:   50 Years Old Female Height:     67 inches (170.18 cm) Weight:      253 pounds BMI:     39.77 O2 Sat:      95 % Temp:     97.3 degrees F Pulse rate:   87 / minute Resp:     16 per minute BP sitting:   123 / 82  Vitals Entered By: Sherilyn Banker (Jan 26, 2007 2:14 PM)               Visit Type:  Acute PCP:  Franchot Heidelberg, MD  Chief Complaint:  SOB, Chest Congestion, and fevers x 4 days.  History of Present Illness: Pt in for acute visit.  She has been sick for 10 days and sx is just not letting up. She feels tired and worn out. She has  minimal stuffy nose. She has post nasal drip. She has had no sneezing but is coughing up a storm. Dry hack. Has some greenish mucous at times. Has been self treating with Mucinex and an inhaler. Helps some. Has felt SOB. She quit smoking for a while and is now back again - smokes 3 to 4 cigs a day.  Ill contact in significant other who has bronchitis. She has no recent travel.   She has felt hot and cold and has been sweaty at times.  Apetite is poor. She denies nausea, vomitting, diarrhea and constipation.  No urinary sx.  Now presents.  Current Allergies (reviewed today): ! MORPHINE  Past Medical History:    Reviewed history from 11/01/2006 and no changes required:       Anxiety       Depression       Hyperlipidemia       Low back pain - chronic. Pain clinic in [facility] is managing  Past Surgical History:    Reviewed history from 11/01/2006 and no changes required:       Tonsillectomy       Back surgery - L4-L5 fusion   Family History:    Reviewed history from 11/01/2006 and no changes required:       Father: Dead ETOH abuse, DM, Bone Marrow Cancer       Mother: Dead when pt was 69 months old - Pneumonia       Siblings: Sisters x 2 - 52 ETOH abuse and liver cirrhosis, 60 DDD C-spine, Anxiety       Brother 10 - ETOH abuse  Social History:  Reviewed history from 11/01/2006 and no changes required:       Occupation: Disabled fromback       Single       Alcohol use-no       Drug use-no       Former Smoker    Review of Systems      See HPI   Physical Exam  General:     Well-developed,well-nourished,in no acute distress; alert,appropriate and cooperative throughout examination Head:     Normocephalic and atraumatic without obvious abnormalities. No apparent alopecia or balding. Eyes:     No corneal or conjunctival inflammation noted. EOMI. Perrla. Funduscopic exam benign, without hemorrhages, exudates or papilledema. Vision grossly normal. Ears:     External ear exam shows no significant lesions or deformities.  Otoscopic examination reveals clear canals, tympanic membranes are intact bilaterally without bulging, retraction, inflammation or discharge. Hearing is grossly normal bilaterally. Nose:  Mild congestion with minimal turbinate inflammtion Mouth:     Oral mucosa and oropharynx without lesions or exudates.   Lungs:     Fair airation with in and exp wheezing. Heart:     Normal rate and regular rhythm. S1 and S2 normal without gallop, murmur, click, rub or other extra sounds. Abdomen:     Bowel sounds positive,abdomen soft and non-tender without masses, organomegaly or hernias noted. Extremities:     No clubbing, cyanosis, edema, or deformity noted with normal full range of motion of all joints.      Impression & Recommendations:  Problem # 1:  BRONCHITIS, ACUTE (ICD-466.0) Discussed. Start doxycycline, tussionex and will add medrol dose pack given wheezing. Encouraged need to quit smoking and educated risk and treatment options. notes not open at present. Push fluids, rest and use MVI daily. Recheck 6 weeks and do spirometry. Her updated medication list for this problem includes:    Tussionex Pennkinetic Er 8-10 Mg/58ml Lqcr (Chlorpheniramine-hydrocodone) .Marland KitchenMarland KitchenMarland KitchenMarland Kitchen 5 ml by mouth two times a day    Doxy-caps  100 Mg Caps (Doxycycline hyclate) .[name]..[name] Two times a day   Problem # 2:  TOBACCO ABUSE (ICD-305.1) See above.  Medications Added to Medication List This Visit: 1)  Medrol (pak) 4 Mg Tabs (Methylprednisolone) .... As directed 2)  Tussionex Pennkinetic Er 8-10 Mg/45ml Lqcr (Chlorpheniramine-hydrocodone) .... 5 ml by mouth two times a day 3)  Doxy-caps 100 Mg Caps (Doxycycline hyclate) .... Two times a day   Patient Instructions: 1)  Please schedule a follow-up appointment in 6 weeks - sooner if needed.

## 2010-10-15 NOTE — Progress Notes (Signed)
Summary: RX for Klonopin  Phone Note Call from Patient   Caller: Patient Call For: Al Summary of Call: Patient of Dr. Claire Shown called.  Anaira said she was seen by the doctor a little over a week ago and he forgot to write a RX for Klonopin.  Would Dr. Erby Pian please call a RX into [facility] at [phone].  Areatha said she takes the Klonopin 3 times a day. Patient phone number is [phone] Initial call taken by: Alden Server,  March 22, 2007 8:07 AM  Follow-up for Phone Call        advise, per med list this is only taken once daily  Follow-up by: Sonny Dandy,  March 22, 2007 9:50 AM  Additional Follow-up for Phone Call Additional follow up Details #1::        Advise patient I am awaiting records from Dr. Lafayette Dragon. She needs to contact that office for refill until I review her records.  Additional Follow-up by: Franchot Heidelberg MD,  March 22, 2007 12:19 PM    Additional Follow-up for Phone Call Additional follow up Details #2::    message left for patient to return call .................................................................Marland KitchenMarland KitchenSonny Dandy  March 22, 2007 3:08 PM  message left for patient to return call given to sister .................................................................Marland KitchenMarland KitchenSonny Dandy  March 23, 2007 8:35 AM    Follow-up by: Sonny Dandy,  March 22, 2007 3:08 PM  Additional Follow-up for Phone Call Additional follow up Details #3:: Details for Additional Follow-up Action Taken: message given to patient, voices understanding  Additional Follow-up by: Sonny Dandy,  March 23, 2007 8:40 AM   Appended Document: RX for Klonopin left message for patient to call me on monday after 0830 to give me and idea what city dr Lafayette Dragon is in, because i am having a hard time finding this dr. 07.11.08...arc  Appended Document: RX for Klonopin patient returned my call and gave me the number and fax number with correct spelling of the doctor...records requested [email or web address].Marland KitchenMarland Kitchenarc

## 2010-10-15 NOTE — Progress Notes (Signed)
Summary: 07/20/2007 lab results  Phone Note Outgoing Call   Call placed by: Sonny Dandy,  July 21, 2007 9:54 AM Summary of Call: called, no answer .................................................................Marland KitchenMarland KitchenSonny Dandy  July 21, 2007 9:54 AM  Results given to patient, voices understanding .................................................................Marland KitchenMarland KitchenSonny Dandy  July 24, 2007 8:56 AM   Initial call taken by: Sonny Dandy,  July 24, 2007 8:56 AM

## 2010-10-15 NOTE — Progress Notes (Signed)
Summary: samples in waiting room  Phone Note Call from Patient   Summary of Call: patient in waiting room wanted to know if we had samples of advair or proventil hfa avaliable. please advise Initial call taken by: Curtis Sites,  April 23, 2009 1:53 PM  Follow-up for Phone Call        Checked closet, no samples available for either medication.  Advised pt. Follow-up by: Micah Flesher, LPN,  April 23, 2009 2:02 PM

## 2010-10-15 NOTE — Letter (Signed)
Summary: New Patient Screening Form  New Patient Screening Form   Imported By: Lutricia Horsfall LPN 81/19/1478 29:56:21  _____________________________________________________________________  External Attachment:    Type:   Image     Comment:   External Document

## 2010-10-15 NOTE — Letter (Signed)
Summary: prior authoriation dental  prior authoriation dental   Imported By: Donneta Romberg 07/13/2007 14:12:15  _____________________________________________________________________  External Attachment:    Type:   Image     Comment:   External Document

## 2010-10-15 NOTE — Letter (Signed)
Summary: Medical History Form, Medication List  Medical History Form, Medication List   Imported By: Lutricia Horsfall LPN 98/07/9146 82:95:62  _____________________________________________________________________  External Attachment:    Type:   Image     Comment:   External Document

## 2010-10-15 NOTE — Assessment & Plan Note (Signed)
Summary: Asthma Recheck/arc   Vital Signs:  Patient Profile:   50 Years Old Female Height:     67 inches (170.18 cm) Weight:      222 pounds BMI:     34.90 O2 Sat:      94 % Temp:     97.5 degrees F Pulse rate:   92 / minute Resp:     14 per minute BP sitting:   143 / 76  Vitals Entered By: Sherilyn Banker (September 01, 2007 9:38 AM)                 PCP:  Franchot Heidelberg, MD  Chief Complaint:  folow up visit.  History of Present Illness: Pt in for recheck.  SHe has been struggling with a severe asthma exacerbation. SHe feels a lot better since Monday and she notes her shortness of breath is improving. She is not coughing as much and notes has some green mucous nasaly and when she coughs. She has decreased wheezing. Her energy is still low. She has had some sweats and chills but denies fever. She denies ear pain and sore throat.  She states she nebs and the steroids make her shaky and she states she can breath better each day. She is using neb every 4 hours - hurts tongue a little.   SHe denies chest pain. She denies orthopnea and PND. No leg swelling. Her apetite is improved and not quite back to normal. No nausea or vomitting. No diarrhea or constipation.  Tolerating meds.  She now presents.  Current Allergies (reviewed today): ! MORPHINE  Past Medical History:    Reviewed history from 11/01/2006 and no changes required:       Anxiety       Depression       Hyperlipidemia       Low back pain - chronic. Pain clinic in [facility] is managing  Past Surgical History:    Reviewed history from 11/01/2006 and no changes required:       Tonsillectomy       Back surgery - L4-L5 fusion   Family History:    Reviewed history from 11/01/2006 and no changes required:       Father: Dead ETOH abuse, DM, Bone Marrow Cancer       Mother: Dead when pt was 26 months old - Pneumonia       Siblings: Sisters x 2 - 52 ETOH abuse and liver cirrhosis, 60 DDD C-spine, Anxiety  Brother 15 - ETOH abuse  Social History:    Reviewed history from 11/01/2006 and no changes required:       Occupation: Disabled fromback       Single       Alcohol use-no       Drug use-no       Former Smoker    Review of Systems      See HPI   Physical Exam  General:     Well-developed,well-nourished,in no acute distress; alert,appropriate and cooperative throughout examination. Tired appearing. Mildly ill. No distress. Lungs:     Decreased airation with rhonchi. No wheezing  Marked improvement vs last eval. Heart:     Normal rate and regular rhythm. S1 and S2 normal without gallop, murmur, click, rub or other extra sounds. Abdomen:     Bowel sounds positive,abdomen soft and non-tender without masses, organomegaly or hernias noted. Extremities:     No clubbing, cyanosis, edema, or deformity noted with normal full range of motion of  all joints.      Impression & Recommendations:  Problem # 1:  ASTHMA, WITH ACUTE EXACERBATION (ICD-493.92) Marked improvement. Rx as is. Stop smoking. Advised rest, Vit C an fluid push. Update if sx worsen. Her updated medication list for this problem includes:    Proventil 90 Mcg/act Aers (Albuterol) .[name]..[name] Two puffs every 6 hours as needed    Symbicort 160-4.5 Mcg/act Aero (Budesonide-formoterol fumarate) .[name]..[name] Two times a day    Prednisone 20 Mg Tabs (Prednisone) .[name]..[name] Two daily for 5 days, then one daily for 5 days, then 1/2 daily for 5 days    Duoneb 2.5-0.5 Mg/85ml Soln (Albuterol-ipratropium) ..... One neb every 4 hours as needed   Complete Medication List: 1)  Flexeril 10 Mg Tabs (Cyclobenzaprine hcl) .... Once daily 2)  Klonopin 1 Mg Tabs (Clonazepam) .... Once daily 3)  Multivitamins Tabs (Multiple vitamin) .... One daily 4)  Senokot Tabs (Sennosides tabs) .... As needed 5)  Kadian 50 Mg Cp24 (Morphine sulfate) .... 50 mg by mouth qam and 30mg  qpm 6)  Dilaudid 2 Mg Tabs (Hydromorphone hcl) .... One by mouth three times daily 7)   Proventil 90 Mcg/act Aers (Albuterol) .... Two puffs every 6 hours as needed 8)  Chantix Starting Month Pak 0.5 Mg X 11 & 1 Mg X 42 Misc (Varenicline tartrate) .... As directed 9)  Synthroid 25 Mcg Tabs (Levothyroxine sodium) .... One daily 10)  Symbicort 160-4.5 Mcg/act Aero (Budesonide-formoterol fumarate) .... Two times a day 11)  Chantix Continuing Month Pak 1 Mg Tabs (Varenicline tartrate) .... As directed 12)  Prednisone 20 Mg Tabs (Prednisone) .... Two daily for 5 days, then one daily for 5 days, then 1/2 daily for 5 days 13)  Duoneb 2.5-0.5 Mg/28ml Soln (Albuterol-ipratropium) .... One neb every 4 hours as needed   Patient Instructions: 1)  Please schedule a follow-up appointment in 1 month.    ]

## 2010-10-15 NOTE — Progress Notes (Signed)
Summary: called to check on pt.  Phone Note Outgoing Call   Call placed by: Sherilyn Banker,  August 29, 2007 9:07 AM Call placed to: Patient Summary of Call: Pt states that she is still congested but she feels much better. She states that she is breathing much easier and she continues to use neb. treatments. I told her to call us if she needs [place].  Follow-up for Phone Call        Noted. Follow-up by: Franchot Heidelberg MD,  August 29, 2007 9:21 AM

## 2011-01-29 NOTE — Procedures (Signed)
[facility]  Patient:    [name] [name], [name] [name]                      MRN: [id number] Proc. Date: 10/20/00 Adm. Date:  [id number] Attending:  Thyra Breed CC:         Donzetta Sprung. Roney Jaffe., MD   Procedure Report  PROCEDURE:  Lumbar epidural steroid injection.  DIAGNOSIS:  Lumbar degenerative disk disease.  ANESTHESIOLOGIST:  Thyra Breed, M.D.  INTERVAL HISTORY:  Since her last evaluation, the patient has had a myelogram which shows some involvement around L3-4 as reported by Dr. Wynetta Emery in a phone conversation recently.  He has requested that we go ahead and give her repeat series of lumbar epidural steroid injections.  PHYSICAL EXAMINATION:  VITAL SIGNS:  Blood pressure 113/60, heart rate 79, respiratory rate 20, O2 saturation 100%, pain level 2/10.  NEUROLOGIC:  Straight leg raise signs are negative.  Deep tendon reflexes are symmetric in the lower extremities.  DESCRIPTION OF PROCEDURE:  After informed consent was obtained, the patient was placed in the sitting position and monitored.  Her back was prepped with Betadine x 3.  A skin wheal was raised at the L3-4 interspace with 1% lidocaine.  A 20-gauge Tuohy needle was introduced in the lumbar epidural space to loss of resistance to preservative free normal saline.  There was no CSF nor blood.  The depth was 5 cm.  I injected 80 mg of and 8 ml of preservative free normal saline very slowly.  The needle was flushed with preservative free normal saline and removed intact.  POSTPROCEDURE CONDITION:  Stable.  DISCHARGE INSTRUCTIONS: 1. Resume previous diet. 2. Limitation of activities per instruction sheet. 3. Continue on current medications. 4. Follow up with me in one to two weeks for repeat injection. DD:  10/20/00 TD:  10/21/00 Job: [id number]

## 2011-01-29 NOTE — Procedures (Signed)
[facility]  Patient:    [name] [name], [name] [name]                      MRN: [id number] Proc. Date: 11/03/00 Adm. Date:  [id number] Attending:  Thyra Breed CC:         Donzetta Sprung. Roney Jaffe., MD   Procedure Report  PROCEDURE:  Lumbar epidural steroid injection.  DIAGNOSIS:  Lumbar degenerative disk disease.  ANESTHESIOLOGIST:  Thyra Breed, M.D.  INTERVAL HISTORY:  The patient has noted a marked improvement.  She did note the day after she got the injection, she felt drunk or high, and I advised her that would be a very unusual reaction to corticosteroid or epidurals but certainly not impossible.  She feels petty good overall today.  PHYSICAL EXAMINATION:  VITAL SIGNS:  Blood pressure 97/49, heart rate 82, respiratory rate 16, O2 saturation 98%, pain level 1/10.  BACK:  Shows good healing from previous injection site.  DESCRIPTION OF PROCEDURE:  After informed consent was obtained, the patient was placed in the sitting position and monitored.  Her back was prepped with Betadine x 3.  A skin wheal was raised at the L3-4 interspace with 1% lidocaine.  A 20-gauge Tuohy needle was introduced in the lumbar epidural space to loss of resistance to preservative free normal saline.  There was no CSF nor blood.  Medrol 80 mg and 8 ml preservative free normal saline was gently injected.  The needle was flushed with preservative free normal saline and removed intact.  POSTPROCEDURE CONDITION:  Stable.  DISCHARGE INSTRUCTIONS: 1. Resume previous diet. 2. Limitation of activities per instruction sheet as outlined by my    assistant today. 3. Continue on current medications. 4. Follow up with me in one to two weeks to consider repeat injection. DD:  11/03/00 TD:  11/04/00 Job: [id number]

## 2011-01-29 NOTE — Procedures (Signed)
[facility]  Patient:    [name] [name], [name] [name]                      MRN: [id number] Proc. Date: 04/22/00 Attending:  Thyra Breed, M.D. CC:         Donzetta Sprung. Roney Jaffe., MD   Procedure Report  PROCEDURE:  Lumbar epidural steroid injection.  DIAGNOSIS:  Lumbar degenerative disk disease.  INTERVAL HISTORY:  The patient has noted modest improvement with her first injection. She presents for a repeat injection today.  PHYSICAL EXAMINATION:  VITAL SIGNS:  Blood pressure 97/54, heart rate 70, respiratory rate 14, O2 saturations 99%, pain level 5/10 and temperature is 97.7.  She shows good healing from her previous injection site.  NEUROLOGIC:  Grossly unchanged.  DESCRIPTION OF PROCEDURE:  After informed consent was obtained, the patient was placed in the sitting position and monitored. The patients back was prepped with Betadine x 3. A skin wheal was raised at the L4-5 interspace with 1 percent lidocaine. A 20 gauge Tuohy needle was introduced to the lumbar epidural space to loss of resistance to preservative free normal saline. There was no cerebrospinal fluid nor blood. 40 mg of Medrol and 10 ml of preservative free normal saline was gently injected. The needle was flushed with preservative free normal saline and removed intact.  CONDITION POST PROCEDURE:  Stable.  DISCHARGE INSTRUCTIONS:  Resume previous diet. Limitations in activities per instruction sheet. Continue on current medications. Follow-up in 2 weeks for a third injection. DD:  04/22/00 TD:  04/24/00 Job: [id number]

## 2011-01-29 NOTE — Procedures (Signed)
[facility]  Patient:    [name] [name], PARRILLO                      MRN: [id number] Proc. Date: 04/11/00 Adm. Date:  [id number] Attending:  Thyra Breed CC:         Donzetta Sprung. Roney Jaffe., MD                           Procedure Report  PROCEDURE:  Lumbar epidural steroid injection.  DIAGNOSIS:  Degenerative disk disease of the lumbar spine.  HISTORY OF PRESENT ILLNESS:  Glyn is a 50 year old who is sent to [place] by Dr. Wynetta Emery for a series of lumbar epidural steroid injections. She states that she has had about a 2 year bout of hip discomfort which initially began when she was jogging. It has gradually gone on to be associated with right anterior thigh numbness and tingling which is intermittent. She has been followed by Dr. Lisabeth Pick for approximately the past year and has had recurrent strain of her back requiring recurrent prednisone dosepaks which temporarily help. She went through about a bout of physical therapy with no improvement and a MRI was obtained on December 26, 1999 which demonstrated degenerative disk disease and mild spondylosis with annular bulging at 4-5, 5-S1. Because of her persistent discomfort, she was seen by Dr. Wynetta Emery on July 17 at which time there was concern that she might have an L4 radiculopathy. She notes that her pain is made worse by sitting or walking for prolonged periods and improved by applying heat to her back. She cannot fish for long periods of time without irritating her back. She denies weakness or bowel or bladder incontinence. She has currently been on Flexeril and Darvocet maintenance on a p.r.n. basis for the past couple of months. She continues to go to the gym 3 times per week. She is intolerant of skelaxin, Vicodin, and Vioxx because of nausea. Celebrex tends to work well. She rates her pain at 4/10 today.  CURRENT MEDICATIONS:  Darvocet.  ALLERGIES:  No known drug allergies.  FAMILY HISTORY:  Positive for  myelodysplasia, emphysema, alcoholism, and diabetes.  PAST SURGICAL HISTORY:  Significant for tonsillectomy.  SOCIAL HISTORY:  The patient dips snuff, she does not drink alcohol. She works as a Administrator, arts for [employer].  REVIEW OF SYSTEMS:  GENERAL/HEAD/EARS/NOSE/MOUTH/THROAT/EYES:  Negative. PULMONARY: Significant for asthma very rarely. CARDIOVASCULAR:  Negative. GI: Negative. GU:  Negative. MUSCULOSKELETAL:  See HPI. NEUROLOGIC:  See HPI. History of seizures x 2 the last one occurring 10 years ago.  HEMATOLOGIC: Negative. ENDOCRINE: Negative. CUTANEOUS:  Negative. PSYCHIATRIC:  Negative. ALLERGY/IMMUNOLOGIC:  Significant for pollen allergies characterized by sneezing and rhinitis.  PHYSICAL EXAMINATION:  VITAL SIGNS:  Blood pressure 94/58, heart rate 78, respiratory rate 20, O2 saturations 100%, pain level 4/10.  GENERAL:  This is a pleasant female in no acute distress.  HEENT:  Head was normocephalic, atraumatic. Eyes, extraocular movements intact. Conjunctivae and sclerae clear. Nose patent nares without discharge. Oropharynx was free of lesions.  NECK:  Supple without lymphadenopathy.  LUNGS:  Clear.  HEART:  Regular rate and rhythm without murmur, gallop or rub.  BREASTS/ABDOMINAL/PELVIC/RECTAL:  Not performed.  BACK:  Revealed intact gait with negative straight leg raise signs.  EXTREMITIES:  No cyanosis, clubbing or edema with radial pulses and dorsalis pedis pulse were 2+ and symmetric.  NEUROLOGIC:  The patient was oriented x 4. Cranial nerves II-XII are  grossly intact. Deep tendon reflexes were symmetric in the upper and lower extremity with downgoing toes. Motor was 5/5 with symmetric bulk and tone. Sensory was intact to pinprick and vibratory sense. Coordination was grossly intact.   IMPRESSION:  1. Low back pain with intermittent numbness and tingling to the right     anterior thigh with MRI demonstrating some degenerative disk disease at     4-5  and 5-S1. 2. Remote history of seizures. 3. Mild asthma. 4. History of allergic rhinitis.  DISPOSITION:  I discussed the potential risks, benefits and limitations of a lumbar epidural steroid injection in detail with the patient as well as with her friend who presented with her today. She wishes to proceed.  DESCRIPTION OF PROCEDURE:  After informed consent was obtained, the patient was placed in the sitting position and monitored. The patients back was prepped with Betadine x 3. A skin wheal was raised at the L4-5 interspace with 1 percent lidocaine. A 20 gauge Tuohy needle was introduced to the lumbar epidural space to loss of resistance to preservative free normal saline. There was no cerebrospinal fluid nor blood. 40 mg of Medrol and 10 ml of preservative free normal saline was gently injected. The needle was flushed with preservative free normal saline and removed intact.  CONDITION POST PROCEDURE:  Stable.  DISCHARGE INSTRUCTIONS:  Resume previous diet. Limitations in activities per instruction sheet. Continue on current medications. Follow-up with me next week for a repeat injection. DD:  04/11/00 TD:  04/12/00 Job: [id number]

## 2011-01-29 NOTE — H&P (Signed)
[facility]  Patient:    [name] [name], [name] [name]                      MRN: [id number] Adm. Date:  [id number] Attending:  Thyra Breed CC:         Donzetta Sprung. Roney Jaffe., MD   History and Physical  HISTORY OF PRESENT ILLNESS:  [name] actually presented today for her third injection but she has no pain.  She is active and feels like things are back to usual.  I advised her that I would hold off on her third injection today and this would allow her to come back sooner, in an effort to try and reduce some of her exposure to corticosteroids.  She was encouraged to call should she have a flare-up of her discomfort and we would consider giving her a third injection at that time. DD:  11/17/00 TD:  11/18/00 Job: [id number]

## 2011-01-29 NOTE — Op Note (Signed)
NAME:  [name] [name], [name] [name]                       ACCOUNT NO.:  [id number]   MEDICAL RECORD NO.:  [id number]                   PATIENT TYPE:  INP   LOCATION:  3036                                 FACILITY:  [facility]   PHYSICIAN:  Donalee Citrin, M.D.                     DATE OF BIRTH:  December 20, 1960   DATE OF PROCEDURE:  01/04/2003  DATE OF DISCHARGE:                                 OPERATIVE REPORT   PREOPERATIVE DIAGNOSES:  Severe degenerative disk disease L4-L5 and L5-S1  with facet mediated pain at L4-L5 and discogenic mechanical back pain,  predominantly L5-S1, and right-sided L4-L5 radiculopathy.   POSTOPERATIVE DIAGNOSES:  Severe degenerative disk disease L4-L5 and L5-S1  with facet mediated pain at L4-L5 and discogenic mechanical back pain,  predominantly L5-S1, and right-sided L4-L5 radiculopathy.   PROCEDURE:  Decompressive laminectomy at L4-L5, L5-S1; posterior lumbar  interbody fusion L4-L5 using 10 x 24 mm TANGENT allograft wedges and  posterior lumbar interbody fusion L5-S1 using 8 x 26 mm TANGENT allograft  wedges; pedicle screw fixation L4 to S1 using the Legacy MA pedicle screw  system with six 5 x 40 pedicle screws at L4 and L5 and six 5 x 30 at S1;  posterolateral arthrodesis using locally harvested autograft L4-L5 and L5-  S1.  Placement of medium Hemovac drain.   SURGEON:  Donalee Citrin, M.D.   ASSISTANT:  Kathaleen Maser. Pool, M.D.   ANESTHESIA:  General endotracheal.   HISTORY OF PRESENT ILLNESS:  The patient is a very pleasant 50 year old  female who has had longstanding back and predominantly right leg pain that  has been going for now several years.  The patient has been refractory to  conservative treatment with anti-inflammatories, physical therapy, epidural  steroid injections, facet blocks; and workup by both MRI and discography  showed severe degenerative disk disease with annular tears at L4-L5 and L5-  S1, diffuse extravasation of dye at L4-L5 and L5-S1 with  concordant pain  reproduction at L4-L5 and L5-S1 with discordant pain reproduction with a  control disk at L3-L4.  Due to the results of the discogram and patient's  failure of conservative treatment and ostensibly back much greater than leg  pain, the patient was recommended decompressive stabilization procedure.  I  extensively went over the risks and benefits of the surgery with her, and  she understood and agreed to proceed forward.   DESCRIPTION OF PROCEDURE:  Patient was brought in to the OR, was induced  under general anesthesia, positioned prone on the Wilson frame.  The back  was prepped and draped in the usual sterile fashion.  A midline incision was  made after localization of the L4-L5 disk space and after infiltration of 10  cc of lidocaine with epinephrine.  Bovie electrocautery was used, taken down  through subcutaneous tissues and subperiosteal dissection was carried out at  the lamina of L4, L5, and S1  bilaterally exposing the __________  and  pedicles of L4, L5, and S1.  Then the decompressive laminectomy was begun  using Leksell rongeur and high speed drill.  Spinous processes were removed.  Radical medial facetectomy was completed.  The lamina was removed as well as  ligamentum flavum.  There seemed to be a tremendous amount of ligamentous  hypertrophy at L4-L5 causing marked stenosis as well as facet hypertrophy  compressing both proximal aspects of the L5 and S1 nerve roots.  After the  L4, L5 and S1 nerve roots were identified they were radically decompressed  at its foramen and the entire medial facet complex was removed exposing the  interspaces.  The epidural veins were coagulated, then attention was first  taken to pedicle screw placement.  Using the high-speed drill, pilot holes  were drilled at L4.  On the left side the pedicle was cannulated with the  awl, fluoroscopy confirming depth and trajectory of position, then tapped  with a __________  tap, probed and  six 5 x 40 pedicle screws inserted.  At  each step along the way the pedicle was probed in 360-degree orientation and  noted to be competent as well as direct infracanalicular inspection which  confirmed no medial breach of the pedicle.  The remainder of the pedicle  screws were placed in this fashion, six 5 x 40 at L5 and six 5 x 30 at S1 in  the left and then the right side this was repeated with six 5 x 40 at L4-L5  and six 5 x 30 on the right.  After all pedicle screws were placed,  attention was taken to diskectomy.  Using a __________  retractor, the L5  nerve root was reflected down medially on the right.  After the ends were  coagulated, the __________ disk radicle was cleaned out with pituitary  rongeurs and this disk was noted to be markedly degenerated.  Then the size  #10 distractor was inserted.  Fluoroscopy confirmed this to be the  appropriate size and attention was taken to the left side, the disk again  with radicle cleaned out using the size #10 medium size cutter and chisel.  The implants were prepared to receive TANGENT allograft.  Fluoroscopy again  confirming depth and position.  A 10 x 24 mm TANGENT allograft was inserted  on the left side.  This procedure was repeated on the right with locally  harvested autograft packed against the allograft on the left prior to  allograft placement on the right.  After placement fluoroscopy confirmed  good position of the bone grafts, then attention was taken to L5-S1.  Again  the S1 nerve root was reflected medially and a size #8 distractor was  appropriately placed and confirmed appropriate size of this wedge here at  this level.  Then the contralateral side was prepared; endplates were cut,  chiseled, and this disk was also noted to be markedly degenerated.  The 8 x  26 mm TANGENT allograft was inserted and then on the right side the locally  harvested autograft was packed against the allograft on the left side after implants  were prepared, and the allograft was inserted on the right side in  similar fashion.  The L4-L5 facet both on the left and right was noted to be  markedly degenerated and incompetent during the facetectomy, and this was  felt to be predominantly the source of her pain as well as the L5-S1  degenerated disk which was noted  to be completely collapsed and markedly  degenerated.  After all the wedges were placed the wound was copiously  irrigated, __________  obtained.  Request for decortication was carried out  to keep the use of L4-L5 and S1.  The remainder of the locally harvested  autograft was packed in the lateral gutters.  Then size 70 mm rods were  inserted into both sides, __________  tightened down at S1.  L5 pedicle  screw was compressed against S1 and the L4 compressed against L5.  Then a  cross-link was applied at L4-L5.  Postoperative fluoroscopy confirmed good  position of the screws, rods, and cross-link of bone grafts.  Then Gelfoam  was overlaid on top of the dura __________  was placed.  Muscle and fascia  were reapproximated with 0 interrupted Vicryl.  The fascia was closed with 0  interrupted Vicryl and the subcutaneous layers with 2-0 interrupted Vicryl,  and the skin was closed with running 4-0 in subcuticular fashion.  Steri-  Strips were applied.  The patient went to recovery room in stable condition.  In addition, prior to closure, a medium Hemovac drain was placed and this  was sewed in.                                                 Donalee Citrin, M.D.    GC/MEDQ  D:  01/04/2003  T:  01/06/2003  Job:  [id number]

## 2011-01-29 NOTE — Op Note (Signed)
[facility]  Patient:    [name] [name], [name] [name]                      MRN: [id number] Proc. Date: 05/06/00 Adm. Date:  [id number] Attending:  Thyra Breed CC:         Donzetta Sprung. Roney Jaffe., MD   Operative Report  PROCEDURE:  Lumbar epidural steroid injection.  DIAGNOSIS:  Lumbar degenerative disk disease.  INTERVAL HISTORY:  The patient has noted that she has only gotten a partial response to her injections to date.  She does feel better than when she initially presented.  Nevertheless, she does not feel as great as she felt last week.  EXAMINATION:  Blood pressure 104/55, heart rate 61, respiratory rate 10, O2 saturation 99%, temperature 97.5.  Pain level is six out of 10.  Her neuro exam is grossly unchanged.  She shows good healing from her previous injection site.  DESCRIPTION OF PROCEDURE:  After informed consent was obtained, the patient was placed in the sitting position and monitored.  Her back was prepped with Betadine x 3.  Her skin level was raised at the L4-5 interspace with 1% lidocaine.  A 20-gauge Tuohy needle was introduced to the lumbar epidural space until loss of resistance to preservative-free normal saline.  There was no CSF nor blood.  Eighty milligrams of Medrol and 10 ml of preservative-free normal saline was injected.  The needle was flushed with the preservative-free normal saline and removed intact.  Post procedure condition was stable.  DISCHARGE INSTRUCTIONS: 1. Resume previous diet. 2. Limitation of activities per instruction sheet. 3. Continue on current medications. 4. Follow up with Dr. Wynetta Emery as previously arranged. DD:  05/06/00 TD:  05/08/00 Job: [id number]

## 2011-01-29 NOTE — Discharge Summary (Signed)
   NAME:  [name] [name], [name] [name]                       ACCOUNT NO.:  [id number]   MEDICAL RECORD NO.:  [id number]                   PATIENT TYPE:  INP   LOCATION:  3036                                 FACILITY:  [facility]   PHYSICIAN:  Donalee Citrin, M.D.                     DATE OF BIRTH:  03/25/1961   DATE OF ADMISSION:  01/04/2003  DATE OF DISCHARGE:  01/07/2003                                 DISCHARGE SUMMARY   FINAL DIAGNOSES:  Severe degenerative disk disease with mechanical low back  pain and diskogenic instability at L4-5 and L5-S1.   PROCEDURES:  Decompression, laminectomy, posterior lumbar interbody fusion  at L4-5 and L5-S1.   HOSPITAL COURSE:  The patient is a 50 year old female with longstanding back  pain that has been going on for several years.  It has gotten worse over the  several months, refractory to all forms of conservative treatment with  physical therapy, anti-inflammatories, epidural steroid injections, facet  block.  Imaging and diskography revealed severe concordant disk degeneration  at L4-5 and L5-S1.  The patient was recommended to undergo stabilization  procedure __________  surgery with her.   The patient was admitted to the hospital  to undergo the aforementioned  procedure.  Postoperatively, the patient did very well. Went from the  recovery room to the floor.  On the floor, she was afebrile with stable  vital signs.  Progressively mobilizing with physical therapy.  Was  experiencing a lot of spasms in her back and was kept on Valium for this  which seemed to control it.  She worked with physical therapy and was  progressively more mobile. By hospital day #3, her Foley was out, her drain  was out, and she was ambulating and voiding spontaneously.  Discharged with  followup in two weeks.                                               Donalee Citrin, M.D.    GC/MEDQ  D:  02/07/2003  T:  02/07/2003  Job:  [id number]

## 2011-11-02 ENCOUNTER — Other Ambulatory Visit (HOSPITAL_COMMUNITY): Payer: Self-pay | Admitting: Internal Medicine

## 2011-11-02 DIAGNOSIS — Z139 Encounter for screening, unspecified: Secondary | ICD-10-CM

## 2011-11-08 ENCOUNTER — Ambulatory Visit (HOSPITAL_COMMUNITY)
Admission: RE | Admit: 2011-11-08 | Discharge: 2011-11-08 | Disposition: A | Discharge status: Home / Self Care | Payer: Medicare Other | Source: Ambulatory Visit | Attending: Internal Medicine | Admitting: Internal Medicine

## 2011-11-08 DIAGNOSIS — Z1231 Encounter for screening mammogram for malignant neoplasm of breast: Secondary | ICD-10-CM | POA: Insufficient documentation

## 2011-11-08 DIAGNOSIS — Z139 Encounter for screening, unspecified: Secondary | ICD-10-CM

## 2012-10-31 ENCOUNTER — Other Ambulatory Visit (HOSPITAL_COMMUNITY): Payer: Self-pay | Admitting: Internal Medicine

## 2012-10-31 ENCOUNTER — Encounter: Payer: Self-pay | Admitting: Gastroenterology

## 2012-10-31 DIAGNOSIS — Z139 Encounter for screening, unspecified: Secondary | ICD-10-CM

## 2012-11-09 ENCOUNTER — Ambulatory Visit (HOSPITAL_COMMUNITY): Payer: Medicare Other

## 2012-11-10 ENCOUNTER — Ambulatory Visit: Payer: Medicare Other | Admitting: Internal Medicine

## 2012-11-21 ENCOUNTER — Ambulatory Visit (AMBULATORY_SURGERY_CENTER): Payer: Medicare Other | Admitting: *Deleted

## 2012-11-21 VITALS — Ht 67.0 in | Wt 251.4 lb

## 2012-11-21 DIAGNOSIS — Z1211 Encounter for screening for malignant neoplasm of colon: Secondary | ICD-10-CM

## 2012-11-21 MED ORDER — PEG-KCL-NACL-NASULF-NA ASC-C 100 G PO SOLR: ORAL | Status: DC

## 2012-11-21 NOTE — Progress Notes (Signed)
No egg or soy allergy  Pt states she takes Senokot to have a bowel movement; only has a BM twice a week and cannot go with the Senokot.  2 day prep instructions given to pt

## 2012-12-05 ENCOUNTER — Encounter: Payer: Self-pay | Admitting: Gastroenterology

## 2012-12-05 ENCOUNTER — Ambulatory Visit (AMBULATORY_SURGERY_CENTER): Payer: Medicare Other | Admitting: Gastroenterology

## 2012-12-05 VITALS — BP 112/61 | HR 75 | Temp 98.6°F | Resp 18 | Ht 67.0 in | Wt 251.0 lb

## 2012-12-05 DIAGNOSIS — Z1211 Encounter for screening for malignant neoplasm of colon: Secondary | ICD-10-CM

## 2012-12-05 MED ORDER — SODIUM CHLORIDE 0.9 % IV SOLN: 500.0000 mL | INTRAVENOUS | Status: DC

## 2012-12-05 NOTE — Op Note (Signed)
[facility] [address] [place], [address]   COLONOSCOPY PROCEDURE REPORT  PATIENT: [name] [name], [name] [name]  MR#: [id number] BIRTHDATE: 16-Nov-1960 , 51  yrs. old GENDER: Female ENDOSCOPIST: Meryl Dare, MD, [facility] REFERRED GE:[id number] Sherryll Burger, M.D. PROCEDURE DATE:  12/05/2012 PROCEDURE:   Colonoscopy, screening ASA CLASS:   Class II INDICATIONS:average risk screening. MEDICATIONS: MAC sedation, administered by CRNA and propofol (Diprivan) 300mg  IV DESCRIPTION OF PROCEDURE:   After the risks benefits and alternatives of the procedure were thoroughly explained, informed consent was obtained.  A digital rectal exam revealed no abnormalities of the rectum.   The LB PCF-H180AL [id number]  endoscope was introduced through the anus and advanced to the cecum, which was identified by both the appendix and ileocecal valve. No adverse events experienced.   The quality of the prep was good, using MoviPrep  The instrument was then slowly withdrawn as the colon was fully examined.  COLON FINDINGS: A normal appearing cecum, ileocecal valve, and appendiceal orifice were identified.  The ascending, hepatic flexure, transverse, splenic flexure, descending, sigmoid colon and rectum appeared unremarkable.  No polyps or cancers were seen. Retroflexed views revealed no abnormalities. The time to cecum=5 minutes 22 seconds.  Withdrawal time=10 minutes 00 seconds.  The scope was withdrawn and the procedure completed.  COMPLICATIONS: There were no complications.  ENDOSCOPIC IMPRESSION: 1.  Normal colon  RECOMMENDATIONS: 1.  Continue current colorectal screening recommendations for "routine risk" patients with a repeat colonoscopy in 10 years.   eSigned:  Meryl Dare, MD, [facility] 12/05/2012 9:44 AM

## 2012-12-05 NOTE — Progress Notes (Signed)
Patient did not experience any of the following events: a burn prior to discharge; a fall within the facility; wrong site/side/patient/procedure/implant event; or a hospital transfer or hospital admission upon discharge from the facility. (G8907) Patient did not have preoperative order for IV antibiotic SSI prophylaxis. (G8918)  

## 2012-12-05 NOTE — Patient Instructions (Addendum)

## 2012-12-06 ENCOUNTER — Telehealth: Payer: Self-pay | Admitting: *Deleted

## 2012-12-06 NOTE — Telephone Encounter (Signed)
Number identifier, left message, follow-up  

## 2012-12-07 ENCOUNTER — Ambulatory Visit (HOSPITAL_COMMUNITY)
Admission: RE | Admit: 2012-12-07 | Discharge: 2012-12-07 | Disposition: A | Discharge status: Home / Self Care | Payer: Medicare Other | Source: Ambulatory Visit | Attending: Internal Medicine | Admitting: Internal Medicine

## 2012-12-07 DIAGNOSIS — Z139 Encounter for screening, unspecified: Secondary | ICD-10-CM

## 2012-12-07 DIAGNOSIS — Z1231 Encounter for screening mammogram for malignant neoplasm of breast: Secondary | ICD-10-CM | POA: Insufficient documentation

## 2013-02-17 DIAGNOSIS — F4329 Adjustment disorder with other symptoms: Secondary | ICD-10-CM | POA: Insufficient documentation

## 2013-07-04 LAB — PULMONARY FUNCTION TEST

## 2013-11-12 ENCOUNTER — Ambulatory Visit: Payer: Self-pay | Admitting: Family Medicine

## 2013-11-16 ENCOUNTER — Other Ambulatory Visit (HOSPITAL_COMMUNITY): Payer: Self-pay | Admitting: Orthopedic Surgery

## 2013-11-16 DIAGNOSIS — R52 Pain, unspecified: Secondary | ICD-10-CM

## 2013-11-16 DIAGNOSIS — M542 Cervicalgia: Secondary | ICD-10-CM

## 2013-11-16 DIAGNOSIS — R200 Anesthesia of skin: Secondary | ICD-10-CM

## 2013-11-20 ENCOUNTER — Ambulatory Visit (HOSPITAL_COMMUNITY): Payer: Medicare HMO

## 2013-11-20 ENCOUNTER — Other Ambulatory Visit (HOSPITAL_COMMUNITY): Payer: Self-pay | Admitting: Orthopedic Surgery

## 2013-11-20 DIAGNOSIS — M542 Cervicalgia: Secondary | ICD-10-CM

## 2013-11-20 DIAGNOSIS — M25511 Pain in right shoulder: Secondary | ICD-10-CM

## 2013-11-20 DIAGNOSIS — R200 Anesthesia of skin: Secondary | ICD-10-CM

## 2013-11-23 ENCOUNTER — Ambulatory Visit (HOSPITAL_COMMUNITY)
Admission: RE | Admit: 2013-11-23 | Discharge: 2013-11-23 | Disposition: A | Discharge status: Home / Self Care | Payer: Medicare HMO | Source: Ambulatory Visit | Attending: Orthopedic Surgery | Admitting: Orthopedic Surgery

## 2013-11-23 ENCOUNTER — Encounter (HOSPITAL_COMMUNITY): Payer: Self-pay

## 2013-11-23 ENCOUNTER — Other Ambulatory Visit (HOSPITAL_COMMUNITY): Payer: Self-pay | Admitting: Orthopedic Surgery

## 2013-11-23 DIAGNOSIS — M4802 Spinal stenosis, cervical region: Secondary | ICD-10-CM | POA: Insufficient documentation

## 2013-11-23 DIAGNOSIS — M898X9 Other specified disorders of bone, unspecified site: Secondary | ICD-10-CM | POA: Insufficient documentation

## 2013-11-23 DIAGNOSIS — M47812 Spondylosis without myelopathy or radiculopathy, cervical region: Secondary | ICD-10-CM | POA: Insufficient documentation

## 2013-11-23 DIAGNOSIS — M542 Cervicalgia: Secondary | ICD-10-CM

## 2013-11-23 DIAGNOSIS — M25511 Pain in right shoulder: Secondary | ICD-10-CM

## 2013-11-23 DIAGNOSIS — M19019 Primary osteoarthritis, unspecified shoulder: Secondary | ICD-10-CM | POA: Insufficient documentation

## 2013-11-23 DIAGNOSIS — R200 Anesthesia of skin: Secondary | ICD-10-CM

## 2013-11-23 DIAGNOSIS — M25519 Pain in unspecified shoulder: Secondary | ICD-10-CM | POA: Insufficient documentation

## 2013-11-23 DIAGNOSIS — IMO0002 Reserved for concepts with insufficient information to code with codable children: Secondary | ICD-10-CM | POA: Insufficient documentation

## 2013-11-23 DIAGNOSIS — M5020 Other cervical disc displacement, unspecified cervical region: Secondary | ICD-10-CM | POA: Insufficient documentation

## 2013-11-23 DIAGNOSIS — R209 Unspecified disturbances of skin sensation: Secondary | ICD-10-CM | POA: Insufficient documentation

## 2013-11-23 DIAGNOSIS — M75100 Unspecified rotator cuff tear or rupture of unspecified shoulder, not specified as traumatic: Secondary | ICD-10-CM | POA: Insufficient documentation

## 2014-09-27 ENCOUNTER — Telehealth: Payer: Self-pay | Admitting: Gastroenterology

## 2014-09-27 NOTE — Telephone Encounter (Signed)
Patient reports two day history of epigastric and right upper quad pain.  She has tried multiple OTC products with minimal relief.  She will come in and see Alonza Bogus, PA on Monday at 1:30.  She is advised if her pain is in her chest or has worsening symptoms over the weekend she should go to the ER.

## 2014-09-30 ENCOUNTER — Ambulatory Visit: Payer: Medicare HMO | Admitting: Gastroenterology

## 2015-01-13 ENCOUNTER — Ambulatory Visit (INDEPENDENT_AMBULATORY_CARE_PROVIDER_SITE_OTHER): Payer: PPO | Admitting: Neurology

## 2015-01-13 ENCOUNTER — Ambulatory Visit (INDEPENDENT_AMBULATORY_CARE_PROVIDER_SITE_OTHER): Payer: Self-pay | Admitting: Neurology

## 2015-01-13 ENCOUNTER — Encounter: Payer: Self-pay | Admitting: Neurology

## 2015-01-13 DIAGNOSIS — G5603 Carpal tunnel syndrome, bilateral upper limbs: Secondary | ICD-10-CM

## 2015-01-13 DIAGNOSIS — G5602 Carpal tunnel syndrome, left upper limb: Secondary | ICD-10-CM

## 2015-01-13 DIAGNOSIS — G5601 Carpal tunnel syndrome, right upper limb: Secondary | ICD-10-CM | POA: Diagnosis not present

## 2015-01-13 DIAGNOSIS — G5600 Carpal tunnel syndrome, unspecified upper limb: Secondary | ICD-10-CM | POA: Insufficient documentation

## 2015-01-13 HISTORY — DX: Carpal tunnel syndrome, unspecified upper limb: G56.00

## 2015-01-13 NOTE — Progress Notes (Signed)
Please refer to EMG and nerve conduction study procedure note. 

## 2015-01-13 NOTE — Procedures (Signed)
     HISTORY:  [name] [name] is a 54 year old patient with a history of bilateral hand numbness dating back greater than one year. The patient has had cervical spine surgery, but the numbness did not improve following the surgery. The patient is being evaluated for possible carpal tunnel syndrome.  NERVE CONDUCTION STUDIES:  Nerve conduction studies were prolonged on both upper extremities. The distal motor latencies for the median nerves were prolonged bilaterally, with normal motor amplitudes for these nerves bilaterally. The distal motor latencies and motor amplitudes for the ulnar nerves were normal bilaterally. The F wave latencies and nerve conduction velocities for the median and ulnar nerves were normal bilaterally. The sensory latencies for the median nerves were prolonged bilaterally, normal for the ulnar nerves bilaterally.  EMG STUDIES:  EMG study was performed on the right upper extremity:  The first dorsal interosseous muscle reveals 2 to 4 K units with full recruitment. No fibrillations or positive waves were noted. The abductor pollicis brevis muscle reveals 2 to 4 K units with slightly decreased recruitment. No fibrillations or positive waves were noted. The extensor indicis proprius muscle reveals 1 to 3 K units with full recruitment. No fibrillations or positive waves were noted. The pronator teres muscle reveals 2 to 3 K units with full recruitment. No fibrillations or positive waves were noted. The biceps muscle reveals 1 to 2 K units with full recruitment. No fibrillations or positive waves were noted. The triceps muscle reveals 2 to 4 K units with full recruitment. No fibrillations or positive waves were noted. The anterior deltoid muscle reveals 2 to 3 K units with full recruitment. No fibrillations or positive waves were noted. The cervical paraspinal muscles were tested at 2 levels. No abnormalities of insertional activity were seen at either level tested. There was  good relaxation.  A limited EMG study was performed on the left upper extremity:  The first dorsal interosseous muscle reveals 2 to 4 K units with full recruitment. No fibrillations or positive waves were noted. The abductor pollicis brevis muscle reveals 2 to 4 K units with full recruitment. No fibrillations or positive waves were noted. The extensor indicis proprius muscle reveals 1 to 3 K units with full recruitment. No fibrillations or positive waves were noted.    IMPRESSION:  Nerve conduction studies done on both upper extremities were notable for bilateral mild carpal tunnel syndrome, slightly more prominent on the right than the left. EMG evaluation the right upper extremity was relatively unremarkable, without evidence of an overlying cervical radiculopathy. A limited EMG evaluation of the left upper extremity was unremarkable.  Jill Alexanders MD 01/13/2015 10:48 AM  [facility] [address] [place], [place] [address]  Phone [phone] Fax [phone]

## 2015-03-15 ENCOUNTER — Emergency Department (HOSPITAL_COMMUNITY)
Admission: AD | Admit: 2015-03-15 | Discharge: 2015-03-15 | Disposition: A | Discharge status: Home / Self Care | Payer: PPO | Attending: Emergency Medicine | Admitting: Emergency Medicine

## 2015-03-15 ENCOUNTER — Encounter (HOSPITAL_COMMUNITY): Payer: Self-pay | Admitting: Emergency Medicine

## 2015-03-15 ENCOUNTER — Emergency Department (HOSPITAL_COMMUNITY): Payer: PPO

## 2015-03-15 DIAGNOSIS — Z87891 Personal history of nicotine dependence: Secondary | ICD-10-CM | POA: Insufficient documentation

## 2015-03-15 DIAGNOSIS — W01198A Fall on same level from slipping, tripping and stumbling with subsequent striking against other object, initial encounter: Secondary | ICD-10-CM | POA: Diagnosis not present

## 2015-03-15 DIAGNOSIS — S80811A Abrasion, right lower leg, initial encounter: Secondary | ICD-10-CM | POA: Insufficient documentation

## 2015-03-15 DIAGNOSIS — Z8719 Personal history of other diseases of the digestive system: Secondary | ICD-10-CM | POA: Diagnosis not present

## 2015-03-15 DIAGNOSIS — Z79899 Other long term (current) drug therapy: Secondary | ICD-10-CM | POA: Diagnosis not present

## 2015-03-15 DIAGNOSIS — Y998 Other external cause status: Secondary | ICD-10-CM | POA: Insufficient documentation

## 2015-03-15 DIAGNOSIS — S60512A Abrasion of left hand, initial encounter: Secondary | ICD-10-CM | POA: Insufficient documentation

## 2015-03-15 DIAGNOSIS — F419 Anxiety disorder, unspecified: Secondary | ICD-10-CM | POA: Diagnosis not present

## 2015-03-15 DIAGNOSIS — Z23 Encounter for immunization: Secondary | ICD-10-CM | POA: Insufficient documentation

## 2015-03-15 DIAGNOSIS — S46012A Strain of muscle(s) and tendon(s) of the rotator cuff of left shoulder, initial encounter: Secondary | ICD-10-CM | POA: Insufficient documentation

## 2015-03-15 DIAGNOSIS — S8392XA Sprain of unspecified site of left knee, initial encounter: Secondary | ICD-10-CM | POA: Diagnosis not present

## 2015-03-15 DIAGNOSIS — Z8669 Personal history of other diseases of the nervous system and sense organs: Secondary | ICD-10-CM | POA: Diagnosis not present

## 2015-03-15 DIAGNOSIS — T148XXA Other injury of unspecified body region, initial encounter: Secondary | ICD-10-CM

## 2015-03-15 DIAGNOSIS — S80812A Abrasion, left lower leg, initial encounter: Secondary | ICD-10-CM | POA: Diagnosis not present

## 2015-03-15 DIAGNOSIS — Y9389 Activity, other specified: Secondary | ICD-10-CM | POA: Insufficient documentation

## 2015-03-15 DIAGNOSIS — Y9289 Other specified places as the place of occurrence of the external cause: Secondary | ICD-10-CM | POA: Diagnosis not present

## 2015-03-15 DIAGNOSIS — E079 Disorder of thyroid, unspecified: Secondary | ICD-10-CM | POA: Insufficient documentation

## 2015-03-15 DIAGNOSIS — S60511A Abrasion of right hand, initial encounter: Secondary | ICD-10-CM | POA: Diagnosis not present

## 2015-03-15 DIAGNOSIS — J449 Chronic obstructive pulmonary disease, unspecified: Secondary | ICD-10-CM | POA: Insufficient documentation

## 2015-03-15 DIAGNOSIS — S46912A Strain of unspecified muscle, fascia and tendon at shoulder and upper arm level, left arm, initial encounter: Secondary | ICD-10-CM

## 2015-03-15 DIAGNOSIS — S8992XA Unspecified injury of left lower leg, initial encounter: Secondary | ICD-10-CM | POA: Diagnosis present

## 2015-03-15 MED ORDER — CEPHALEXIN 500 MG PO CAPS: 500.0000 mg | ORAL_CAPSULE | Freq: Four times a day (QID) | ORAL | Status: DC

## 2015-03-15 MED ORDER — TETANUS-DIPHTH-ACELL PERTUSSIS 5-2.5-18.5 LF-MCG/0.5 IM SUSP
0.5000 mL | Freq: Once | INTRAMUSCULAR | Status: AC
  Administered 2015-03-15: 0.5 mL via INTRAMUSCULAR
  Filled 2015-03-15: qty 0.5

## 2015-03-15 MED ORDER — TRAMADOL HCL 50 MG PO TABS: 50.0000 mg | ORAL_TABLET | Freq: Four times a day (QID) | ORAL | Status: DC | PRN

## 2015-03-15 NOTE — ED Provider Notes (Signed)
CSN: [id number]     Arrival date & time 03/15/15  1413 History   First MD Initiated Contact with Patient 03/15/15 1432     Chief Complaint  Patient presents with  . Knee Pain     (Consider location/radiation/quality/duration/timing/severity/associated sxs/prior Treatment) HPI   [name] [name] is a 54 y.o. female who presents to the Emergency Department complaining of left knee pain and left shoulder pain for one day.  She states that she stepped in a hole while mowing , and fell down an embankment landing on her left side.  She reports a twisting injury to the knee and now has worsening pain with attempted weight bearing.  She also reports having multiple scratches from landing in briar bushes.  Pain is localized to the medial knee.  She also reports left shoulder pain.  She denies neck pain, back pain, head injury or LOC.  She take morphine daily for chronic pain and states her pain has been controlled.'    Past Medical History  Diagnosis Date  . Asthma   . COPD (chronic obstructive pulmonary disease)   . Emphysema of lung   . GERD (gastroesophageal reflux disease)   . Thyroid disease     hypo thyroid  . Nerve damage     right leg  . Neuromuscular disorder     fibromyalgia  . Anxiety   . Carpal tunnel syndrome 01/13/2015    Bilateral   Past Surgical History  Procedure Laterality Date  . Anterior cervical decomp/discectomy fusion      L4, L5   . Tonsillectomy     Family History  Problem Relation Age of Onset  . Colon cancer Neg Hx   . Esophageal cancer Neg Hx   . Stomach cancer Neg Hx   . Rectal cancer Neg Hx    History  Substance Use Topics  . Smoking status: Former Smoker    Quit date: 11/22/2007  . Smokeless tobacco: Never Used  . Alcohol Use: No   OB History    No data available     Review of Systems  Constitutional: Negative for fever and chills.  Gastrointestinal: Negative for vomiting.  Genitourinary: Negative for dysuria and difficulty urinating.   Musculoskeletal: Positive for arthralgias (left knee and shoulder pain). Negative for joint swelling.  Skin: Positive for wound (scratches). Negative for color change.  Neurological: Negative for dizziness, weakness, numbness and headaches.  All other systems reviewed and are negative.     Allergies  Celebrex; Nsaids; and Percocet  Home Medications   Prior to Admission medications   Medication Sig Start Date End Date Taking? Authorizing Provider  buPROPion (WELLBUTRIN SR) 200 MG 12 hr tablet Take 200 mg by mouth 2 (two) times daily.   Yes Historical Provider, MD  diazepam (VALIUM) 5 MG tablet Take 5 mg by mouth daily as needed. 06/13/13  Yes Historical Provider, MD  gabapentin (NEURONTIN) 600 MG tablet Take 600 mg by mouth 3 (three) times daily.   Yes Historical Provider, MD  levothyroxine (SYNTHROID, LEVOTHROID) 112 MCG tablet Take 112 mcg by mouth daily before breakfast.   Yes Historical Provider, MD  morphine (MS CONTIN) 60 MG 12 hr tablet Take 60 mg by mouth 3 (three) times daily.   Yes Historical Provider, MD  morphine (MSIR) 15 MG tablet Take 15 mg by mouth every 4 (four) hours as needed for severe pain.   Yes Historical Provider, MD  sertraline (ZOLOFT) 100 MG tablet Take 100 mg by mouth daily. 03/14/14  Yes Historical Provider, MD  tizanidine (ZANAFLEX) 2 MG capsule Take 2 mg by mouth 3 (three) times daily.   Yes Historical Provider, MD  cephALEXin (KEFLEX) 500 MG capsule Take 1 capsule (500 mg total) by mouth 4 (four) times daily. For 7 days 03/15/15   Wyley Hack, PA-C  traZODone (DESYREL) 100 MG tablet Take 100 mg by mouth at bedtime as needed for sleep.     Historical Provider, MD   BP 120/72 mmHg  Pulse 71  Temp(Src) 98 F (36.7 C) (Oral)  Resp 18  Ht 5\' 5"  (1.651 m)  Wt 250 lb (113.399 kg)  BMI 41.60 kg/m2  SpO2 95% Physical Exam  Constitutional: She is oriented to person, place, and time. She appears well-developed and well-nourished. No distress.  Cardiovascular:  Normal rate, regular rhythm, normal heart sounds and intact distal pulses.   Pulmonary/Chest: Effort normal and breath sounds normal.  Musculoskeletal: She exhibits tenderness.  ttp of the medial right knee.  No erythema, effusion, or step-off deformity.  DP pulse brisk, distal sensation intact. Calf is soft and NT. Compartments soft.  Pain to left shoulder reproduced with abduction,  No bony deformity.    Neurological: She is alert and oriented to person, place, and time. She exhibits normal muscle tone. Coordination normal.  Skin: Skin is warm and dry. No erythema.  Patient has multiple superficial scratches to the bilateral upper and lower extremities.  Nursing note and vitals reviewed.   ED Course  Procedures (including critical care time) Labs Review Labs Reviewed - No data to display  Imaging Review Dg Shoulder Left  03/15/2015   CLINICAL DATA:  Status post fall yesterday with left shoulder pain.  EXAM: LEFT SHOULDER - 2+ VIEW  COMPARISON:  None.  FINDINGS: There is no evidence of fracture or dislocation. There is no evidence of arthropathy or other focal bone abnormality. Soft tissues are unremarkable.  IMPRESSION: Negative.   Electronically Signed   By: Abelardo Diesel M.D.   On: 03/15/2015 15:37   Dg Knee Complete 4 Views Left  03/15/2015   CLINICAL DATA:  Status post fall yesterday with knee pain.  EXAM: LEFT KNEE - COMPLETE 4+ VIEW  COMPARISON:  None.  FINDINGS: There is no evidence of fracture, dislocation, or joint effusion. There is no evidence of arthropathy or other focal bone abnormality. Soft tissues are unremarkable.  IMPRESSION: Negative.   Electronically Signed   By: Abelardo Diesel M.D.   On: 03/15/2015 15:37     EKG Interpretation None      MDM   Final diagnoses:  Knee sprain, left, initial encounter  Shoulder strain, left, initial encounter  Scratches    Pt is ambulatory.  XR is neg for acute bony injury, exam is concerning for meniscal injury and possible  rotator cuff injury.      Knee immobilizer applied, pain improved, remains NV intact.  Pt has crutches at home.  Referral to orthopedic given.  She appears stable for d/c and agrees to plan.  Agrees to cont her morphine for pain control    Kem Parkinson, PA-C 03/16/15 2131  Daleen Bo, MD 03/17/15 [phone]

## 2015-03-15 NOTE — ED Notes (Signed)
Pt reports stepped in hole while trying to mow an embankment yesterday. Pt reports ever since has had left shoulder pain and left knee pain. Generalized abrasions noted. Pt reports neck surgery in April 2016.no deformity noted.

## 2015-03-15 NOTE — Discharge Instructions (Signed)
Knee Sprain A knee sprain is a tear in the strong bands of tissue that connect the bones (ligaments) of your knee. HOME CARE  Raise (elevate) your injured knee to lessen puffiness (swelling).  To ease pain and puffiness, put ice on the injured area.  Put ice in a plastic bag.  Place a towel between your skin and the bag.  Leave the ice on for 20 minutes, 2-3 times a day.  Only take medicine as told by your doctor.  Do not leave your knee unprotected until pain and stiffness go away (usually 4-6 weeks).  If you have a cast or splint, do not get it wet. If your doctor told you to not take it off, cover it with a plastic bag when you shower or bathe. Do not swim.  Your doctor may have you do exercises to prevent or limit permanent weakness and stiffness. GET HELP RIGHT AWAY IF:   Your cast or splint becomes damaged.  Your pain gets worse.  You have a lot of pain, puffiness, or numbness below the cast or splint. MAKE SURE YOU:   Understand these instructions.  Will watch your condition.  Will get help right away if you are not doing well or get worse. Document Released: 08/18/2009 Document Revised: 09/04/2013 Document Reviewed: 05/08/2013 [facility] Patient Information 2015 [place], [place]. This information is not intended to replace advice given to you by your health care provider. Make sure you discuss any questions you have with your health care provider.

## 2015-09-17 ENCOUNTER — Ambulatory Visit (HOSPITAL_COMMUNITY): Payer: PPO | Attending: Neurosurgery | Admitting: Physical Therapy

## 2015-09-18 DIAGNOSIS — J45901 Unspecified asthma with (acute) exacerbation: Secondary | ICD-10-CM | POA: Diagnosis not present

## 2015-09-19 ENCOUNTER — Emergency Department (HOSPITAL_COMMUNITY): Payer: PPO

## 2015-09-19 ENCOUNTER — Encounter (HOSPITAL_COMMUNITY): Payer: Self-pay

## 2015-09-19 ENCOUNTER — Inpatient Hospital Stay (HOSPITAL_COMMUNITY)
Admission: EM | Admit: 2015-09-19 | Discharge: 2015-09-29 | DRG: 163 | Disposition: A | Discharge status: Home / Self Care | Payer: PPO | Attending: Internal Medicine | Admitting: Internal Medicine

## 2015-09-19 ENCOUNTER — Other Ambulatory Visit: Payer: Self-pay

## 2015-09-19 DIAGNOSIS — R0602 Shortness of breath: Secondary | ICD-10-CM | POA: Diagnosis not present

## 2015-09-19 DIAGNOSIS — Z4682 Encounter for fitting and adjustment of non-vascular catheter: Secondary | ICD-10-CM

## 2015-09-19 DIAGNOSIS — Z938 Other artificial opening status: Secondary | ICD-10-CM

## 2015-09-19 DIAGNOSIS — R071 Chest pain on breathing: Secondary | ICD-10-CM | POA: Diagnosis not present

## 2015-09-19 DIAGNOSIS — J45909 Unspecified asthma, uncomplicated: Secondary | ICD-10-CM | POA: Diagnosis present

## 2015-09-19 DIAGNOSIS — E876 Hypokalemia: Secondary | ICD-10-CM | POA: Diagnosis not present

## 2015-09-19 DIAGNOSIS — J189 Pneumonia, unspecified organism: Secondary | ICD-10-CM | POA: Diagnosis not present

## 2015-09-19 DIAGNOSIS — E038 Other specified hypothyroidism: Secondary | ICD-10-CM

## 2015-09-19 DIAGNOSIS — G894 Chronic pain syndrome: Secondary | ICD-10-CM | POA: Diagnosis present

## 2015-09-19 DIAGNOSIS — E669 Obesity, unspecified: Secondary | ICD-10-CM | POA: Diagnosis present

## 2015-09-19 DIAGNOSIS — J440 Chronic obstructive pulmonary disease with acute lower respiratory infection: Secondary | ICD-10-CM | POA: Diagnosis not present

## 2015-09-19 DIAGNOSIS — R0902 Hypoxemia: Secondary | ICD-10-CM

## 2015-09-19 DIAGNOSIS — J9601 Acute respiratory failure with hypoxia: Secondary | ICD-10-CM | POA: Diagnosis not present

## 2015-09-19 DIAGNOSIS — Z79891 Long term (current) use of opiate analgesic: Secondary | ICD-10-CM

## 2015-09-19 DIAGNOSIS — M4327 Fusion of spine, lumbosacral region: Secondary | ICD-10-CM | POA: Diagnosis not present

## 2015-09-19 DIAGNOSIS — R0781 Pleurodynia: Secondary | ICD-10-CM

## 2015-09-19 DIAGNOSIS — F419 Anxiety disorder, unspecified: Secondary | ICD-10-CM | POA: Diagnosis not present

## 2015-09-19 DIAGNOSIS — M797 Fibromyalgia: Secondary | ICD-10-CM | POA: Diagnosis not present

## 2015-09-19 DIAGNOSIS — J918 Pleural effusion in other conditions classified elsewhere: Secondary | ICD-10-CM

## 2015-09-19 DIAGNOSIS — G5600 Carpal tunnel syndrome, unspecified upper limb: Secondary | ICD-10-CM | POA: Diagnosis present

## 2015-09-19 DIAGNOSIS — K219 Gastro-esophageal reflux disease without esophagitis: Secondary | ICD-10-CM | POA: Diagnosis present

## 2015-09-19 DIAGNOSIS — J90 Pleural effusion, not elsewhere classified: Secondary | ICD-10-CM | POA: Diagnosis present

## 2015-09-19 DIAGNOSIS — Z87891 Personal history of nicotine dependence: Secondary | ICD-10-CM

## 2015-09-19 DIAGNOSIS — J869 Pyothorax without fistula: Secondary | ICD-10-CM | POA: Diagnosis not present

## 2015-09-19 DIAGNOSIS — Z66 Do not resuscitate: Secondary | ICD-10-CM | POA: Diagnosis not present

## 2015-09-19 DIAGNOSIS — E039 Hypothyroidism, unspecified: Secondary | ICD-10-CM | POA: Diagnosis present

## 2015-09-19 DIAGNOSIS — Z9889 Other specified postprocedural states: Secondary | ICD-10-CM

## 2015-09-19 DIAGNOSIS — Z6838 Body mass index (BMI) 38.0-38.9, adult: Secondary | ICD-10-CM

## 2015-09-19 DIAGNOSIS — F418 Other specified anxiety disorders: Secondary | ICD-10-CM | POA: Diagnosis present

## 2015-09-19 DIAGNOSIS — R0789 Other chest pain: Secondary | ICD-10-CM | POA: Diagnosis not present

## 2015-09-19 DIAGNOSIS — Z981 Arthrodesis status: Secondary | ICD-10-CM

## 2015-09-19 DIAGNOSIS — J811 Chronic pulmonary edema: Secondary | ICD-10-CM | POA: Diagnosis not present

## 2015-09-19 DIAGNOSIS — F17200 Nicotine dependence, unspecified, uncomplicated: Secondary | ICD-10-CM | POA: Diagnosis present

## 2015-09-19 DIAGNOSIS — M545 Low back pain: Secondary | ICD-10-CM | POA: Diagnosis present

## 2015-09-19 DIAGNOSIS — J948 Other specified pleural conditions: Secondary | ICD-10-CM | POA: Diagnosis not present

## 2015-09-19 LAB — BASIC METABOLIC PANEL
Anion gap: 10 (ref 5–15)
BUN: 13 mg/dL (ref 6–20)
CO2: 30 mmol/L (ref 22–32)
Calcium: 9.3 mg/dL (ref 8.9–10.3)
Chloride: 102 mmol/L (ref 101–111)
Creatinine, Ser: 0.77 mg/dL (ref 0.44–1.00)
GFR calc Af Amer: >60 mL/min (ref >60)
GFR calc non Af Amer: >60 mL/min (ref >60)
Glucose, Bld: 131 mg/dL — ABNORMAL HIGH (ref 65–99)
Potassium: 4.1 mmol/L (ref 3.5–5.1)
Sodium: 142 mmol/L (ref 135–145)

## 2015-09-19 LAB — D-DIMER, QUANTITATIVE: D-Dimer, Quant: 3.43 ug/mL-FEU — ABNORMAL HIGH (ref 0.00–0.50)

## 2015-09-19 LAB — CBC WITH DIFFERENTIAL/PLATELET
Basophils Absolute: 0 10*3/uL (ref 0.0–0.1)
Basophils Relative: 0 %
Eosinophils Absolute: 0 10*3/uL (ref 0.0–0.7)
Eosinophils Relative: 0 %
HCT: 43.1 % (ref 36.0–46.0)
Hemoglobin: 14.3 g/dL (ref 12.0–15.0)
Lymphocytes Relative: 9 %
Lymphs Abs: 1.3 10*3/uL (ref 0.7–4.0)
MCH: 31.6 pg (ref 26.0–34.0)
MCHC: 33.2 g/dL (ref 30.0–36.0)
MCV: 95.1 fL (ref 78.0–100.0)
Monocytes Absolute: 0.6 10*3/uL (ref 0.1–1.0)
Monocytes Relative: 4 %
Neutro Abs: 11.9 10*3/uL — ABNORMAL HIGH (ref 1.7–7.7)
Neutrophils Relative %: 87 %
Platelets: 355 10*3/uL (ref 150–400)
RBC: 4.53 MIL/uL (ref 3.87–5.11)
RDW: 12 % (ref 11.5–15.5)
WBC: 13.7 10*3/uL — ABNORMAL HIGH (ref 4.0–10.5)

## 2015-09-19 LAB — TSH: TSH: 2.894 u[IU]/mL (ref 0.350–4.500)

## 2015-09-19 LAB — TROPONIN I
Troponin I: <0.03 ng/mL (ref <0.031)
Troponin I: <0.03 ng/mL (ref <0.031)
Troponin I: <0.03 ng/mL (ref <0.031)

## 2015-09-19 MED ORDER — ENOXAPARIN SODIUM 60 MG/0.6ML ~~LOC~~ SOLN
50.0000 mg | SUBCUTANEOUS | Status: DC
Start: 2015-09-19 — End: 2015-09-27
  Administered 2015-09-19 – 2015-09-26 (×7): 50 mg via SUBCUTANEOUS
  Filled 2015-09-19 (×7): qty 0.6

## 2015-09-19 MED ORDER — GABAPENTIN 600 MG PO TABS: 600.0000 mg | ORAL_TABLET | Freq: Three times a day (TID) | ORAL | Status: DC

## 2015-09-19 MED ORDER — DEXTROSE 5 % IV SOLN
500.0000 mg | INTRAVENOUS | Status: DC
  Administered 2015-09-19: 500 mg via INTRAVENOUS
  Filled 2015-09-19 (×4): qty 500

## 2015-09-19 MED ORDER — MORPHINE SULFATE (PF) 4 MG/ML IV SOLN
INTRAVENOUS | Status: AC
  Filled 2015-09-19: qty 1

## 2015-09-19 MED ORDER — GABAPENTIN 300 MG PO CAPS
600.0000 mg | ORAL_CAPSULE | Freq: Three times a day (TID) | ORAL | Status: DC
  Administered 2015-09-19 – 2015-09-29 (×29): 600 mg via ORAL
  Filled 2015-09-19 (×19): qty 2
  Filled 2015-09-19: qty 6
  Filled 2015-09-19 (×9): qty 2

## 2015-09-19 MED ORDER — DEXTROSE 5 % IV SOLN
1.0000 g | INTRAVENOUS | Status: DC
  Administered 2015-09-19 – 2015-09-20 (×2): 1 g via INTRAVENOUS
  Filled 2015-09-19 (×3): qty 10

## 2015-09-19 MED ORDER — MORPHINE SULFATE ER 30 MG PO TBCR
60.0000 mg | EXTENDED_RELEASE_TABLET | Freq: Three times a day (TID) | ORAL | Status: DC
  Administered 2015-09-19 – 2015-09-20 (×3): 60 mg via ORAL
  Filled 2015-09-19 (×3): qty 2

## 2015-09-19 MED ORDER — SERTRALINE HCL 100 MG PO TABS
100.0000 mg | ORAL_TABLET | Freq: Every day | ORAL | Status: DC
  Administered 2015-09-19 – 2015-09-29 (×10): 100 mg via ORAL
  Filled 2015-09-19: qty 1
  Filled 2015-09-19: qty 2
  Filled 2015-09-19 (×2): qty 1
  Filled 2015-09-19: qty 2
  Filled 2015-09-19: qty 1
  Filled 2015-09-19: qty 2
  Filled 2015-09-19: qty 1
  Filled 2015-09-19: qty 2
  Filled 2015-09-19: qty 1

## 2015-09-19 MED ORDER — HYDROMORPHONE HCL 1 MG/ML IJ SOLN
1.0000 mg | INTRAMUSCULAR | Status: DC | PRN
  Administered 2015-09-19 – 2015-09-20 (×5): 1 mg via INTRAVENOUS
  Filled 2015-09-19 (×5): qty 1

## 2015-09-19 MED ORDER — ONDANSETRON HCL 4 MG PO TABS: 4.0000 mg | ORAL_TABLET | Freq: Four times a day (QID) | ORAL | Status: DC | PRN

## 2015-09-19 MED ORDER — TRAZODONE HCL 50 MG PO TABS: 100.0000 mg | ORAL_TABLET | Freq: Every evening | ORAL | Status: DC | PRN

## 2015-09-19 MED ORDER — DIAZEPAM 5 MG PO TABS: 5.0000 mg | ORAL_TABLET | Freq: Three times a day (TID) | ORAL | Status: DC | PRN

## 2015-09-19 MED ORDER — FENTANYL CITRATE (PF) 100 MCG/2ML IJ SOLN
50.0000 ug | Freq: Once | INTRAMUSCULAR | Status: AC
  Administered 2015-09-19: 50 ug via INTRAVENOUS
  Filled 2015-09-19: qty 2

## 2015-09-19 MED ORDER — DEXTROSE 5 % IV SOLN
500.0000 mg | INTRAVENOUS | Status: DC
  Administered 2015-09-20 – 2015-09-21 (×2): 500 mg via INTRAVENOUS
  Filled 2015-09-19 (×3): qty 500

## 2015-09-19 MED ORDER — ONDANSETRON HCL 4 MG/2ML IJ SOLN
4.0000 mg | Freq: Four times a day (QID) | INTRAMUSCULAR | Status: DC | PRN
  Administered 2015-09-24 (×2): 4 mg via INTRAVENOUS

## 2015-09-19 MED ORDER — LEVOTHYROXINE SODIUM 112 MCG PO TABS
112.0000 ug | ORAL_TABLET | Freq: Every day | ORAL | Status: DC
  Administered 2015-09-19 – 2015-09-29 (×10): 112 ug via ORAL
  Filled 2015-09-19 (×10): qty 1

## 2015-09-19 MED ORDER — BUPROPION HCL ER (SR) 100 MG PO TB12
200.0000 mg | ORAL_TABLET | Freq: Two times a day (BID) | ORAL | Status: DC
Start: 2015-09-19 — End: 2015-09-29
  Administered 2015-09-19 – 2015-09-29 (×18): 200 mg via ORAL
  Filled 2015-09-19 (×29): qty 2

## 2015-09-19 MED ORDER — DEXTROSE 5 % IV SOLN
1.0000 g | Freq: Once | INTRAVENOUS | Status: AC
  Administered 2015-09-19: 1 g via INTRAVENOUS
  Filled 2015-09-19: qty 10

## 2015-09-19 MED ORDER — DEXAMETHASONE SODIUM PHOSPHATE 10 MG/ML IJ SOLN
10.0000 mg | Freq: Once | INTRAMUSCULAR | Status: AC
  Administered 2015-09-19: 10 mg via INTRAVENOUS
  Filled 2015-09-19: qty 1

## 2015-09-19 MED ORDER — MORPHINE SULFATE (PF) 4 MG/ML IV SOLN: 4.0000 mg | Freq: Once | INTRAVENOUS | Status: DC

## 2015-09-19 MED ORDER — IOHEXOL 350 MG/ML SOLN
100.0000 mL | Freq: Once | INTRAVENOUS | Status: AC | PRN
  Administered 2015-09-19: 100 mL via INTRAVENOUS

## 2015-09-19 MED ORDER — SODIUM CHLORIDE 0.9 % IV SOLN
INTRAVENOUS | Status: DC
  Administered 2015-09-19 (×2): via INTRAVENOUS

## 2015-09-19 NOTE — ED Notes (Signed)
Pt awoke with sudden onset of sharp stabbing pain to her right ribcage.  Pt states she has asthma and has been using her neb treatments.  Pt states it hurts to take a deep breath and pain is also worse with movement.

## 2015-09-19 NOTE — H&P (Signed)
[facility] History and Physical  [name] [name] [id number] DOB: Apr 28, 1961    PCP:   Antony Blackbird, MD   Chief Complaint: pleuritic CP.   HPI: [name] [name] is an 55 y.o. female with hx of chronic low back pain on chronic narcotics, COPD, asthma, hypothryrodismm GERD, anxiety, but no known CAD, presented to the ER with bilateral chest pain with breathing.  She denied fever, chills, or coughs.  No nausea, vomting, fever or chills.  She has no diaphoresis, but has SOB.  Evaluation in the ER included a D-dimer which was elevated to 3.43, resulting in CTPA showing no PE, but confirmed bilateral pleural effusion and dense PNA on the RLL.  Her serology showed leukocytosis with WBC of 14K, but otherwise unremarkalble.  Her EKG showed no acute ST T changes.  Her pulse ox was a little low at 88 percent, and Hopsitalist was asked to admit her for CAP, likely pleurisy, and hypoxia.   Rewiew of Systems:  Constitutional: Negative for malaise, fever and chills. No significant weight loss or weight gain Eyes: Negative for eye pain, redness and discharge, diplopia, visual changes, or flashes of light. ENMT: Negative for ear pain, hoarseness, nasal congestion, sinus pressure and sore throat. No headaches; tinnitus, drooling, or problem swallowing. Cardiovascular: Negative for palpitations, diaphoresis, dyspnea and peripheral edema. ; No orthopnea, PND Respiratory: Negative for  hemoptysis, wheezing and stridor. No pleuritic chestpain. Gastrointestinal: Negative for nausea, vomiting, diarrhea, constipation, abdominal pain, melena, blood in stool, hematemesis, jaundice and rectal bleeding.    Genitourinary: Negative for frequency, dysuria, incontinence,flank pain and hematuria; Musculoskeletal: Negative for back pain and neck pain. Negative for swelling and trauma.;  Skin: . Negative for pruritus, rash, abrasions, bruising and skin lesion.; ulcerations Neuro: Negative for headache, lightheadedness  and neck stiffness. Negative for weakness, altered level of consciousness , altered mental status, extremity weakness, burning feet, involuntary movement, seizure and syncope.  Psych: negative for anxiety, depression, insomnia, tearfulness, panic attacks, hallucinations, paranoia, suicidal or homicidal ideation    Past Medical History  Diagnosis Date  . Asthma   . COPD (chronic obstructive pulmonary disease) ([place])   . Emphysema of lung ([place])   . GERD (gastroesophageal reflux disease)   . Thyroid disease     hypo thyroid  . Nerve damage     right leg  . Neuromuscular disorder ([facility])     fibromyalgia  . Anxiety   . Carpal tunnel syndrome 01/13/2015    Bilateral    Past Surgical History  Procedure Laterality Date  . Anterior cervical decomp/discectomy fusion      L4, L5   . Tonsillectomy      Medications:  HOME MEDS: Prior to Admission medications   Medication Sig Start Date End Date Taking? Authorizing Provider  buPROPion (WELLBUTRIN SR) 200 MG 12 hr tablet Take 200 mg by mouth 2 (two) times daily.    Historical Provider, MD  cephALEXin (KEFLEX) 500 MG capsule Take 1 capsule (500 mg total) by mouth 4 (four) times daily. For 7 days 03/15/15   Tammy Triplett, PA-C  diazepam (VALIUM) 5 MG tablet Take 5 mg by mouth daily as needed. 06/13/13   Historical Provider, MD  gabapentin (NEURONTIN) 600 MG tablet Take 600 mg by mouth 3 (three) times daily.    Historical Provider, MD  levothyroxine (SYNTHROID, LEVOTHROID) 112 MCG tablet Take 112 mcg by mouth daily before breakfast.    Historical Provider, MD  morphine (MS CONTIN) 60 MG 12 hr tablet Take 60 mg  by mouth 3 (three) times daily.    Historical Provider, MD  morphine (MSIR) 15 MG tablet Take 15 mg by mouth every 4 (four) hours as needed for severe pain.    Historical Provider, MD  sertraline (ZOLOFT) 100 MG tablet Take 100 mg by mouth daily. 03/14/14   Historical Provider, MD  tizanidine (ZANAFLEX) 2 MG capsule Take 2 mg by mouth 3 (three)  times daily.    Historical Provider, MD  traMADol (ULTRAM) 50 MG tablet Take 1 tablet (50 mg total) by mouth every 6 (six) hours as needed. 03/15/15   Tammy Triplett, PA-C  traZODone (DESYREL) 100 MG tablet Take 100 mg by mouth at bedtime as needed for sleep.     Historical Provider, MD     Allergies:  Allergies  Allergen Reactions  . Celebrex [Celecoxib] Other (See Comments)    Severe epigastric pain  . Nsaids Other (See Comments)    SEVERE EPIGASTRIC PAIN  . Percocet [Oxycodone-Acetaminophen] Itching    Social History:   reports that she quit smoking about 7 years ago. She has never used smokeless tobacco. She reports that she does not drink alcohol or use illicit drugs.  Family History: Family History  Problem Relation Age of Onset  . Colon cancer Neg Hx   . Esophageal cancer Neg Hx   . Stomach cancer Neg Hx   . Rectal cancer Neg Hx      Physical Exam: Filed Vitals:   09/19/15 0106 09/19/15 0238 09/19/15 0406 09/19/15 0438  BP: 136/81 146/83 127/88 133/71  Pulse: 89 100 104 103  Temp: 98 F (36.7 C)     TempSrc: Oral     Resp: 32 30 26 30   Height: 5\' 6"  (1.676 m)     Weight: 108.863 kg (240 lb)     SpO2: 88% 96% 96% 96%   Blood pressure 133/71, pulse 103, temperature 98 F (36.7 C), temperature source Oral, resp. rate 30, height 5\' 6"  (1.676 m), weight 108.863 kg (240 lb), SpO2 96 %.  GEN:  Pleasant patient lying in the stretcher in no acute distress; cooperative with exam. PSYCH:  alert and oriented x4; does not appear anxious or depressed; affect is appropriate. HEENT: Mucous membranes pink and anicteric; PERRLA; EOM intact; no cervical lymphadenopathy nor thyromegaly or carotid bruit; no JVD; There were no stridor. Neck is very supple. Breasts:: Not examined CHEST WALL: No tenderness CHEST: Normal respiration, scattered rhonchi, with no wheezing or rales.  HEART: Regular rate and rhythm.  There are no murmur, rub, or gallops.   BACK: No kyphosis or scoliosis;  no CVA tenderness ABDOMEN: soft and non-tender; no masses, no organomegaly, normal abdominal bowel sounds; no pannus; no intertriginous candida. There is no rebound and no distention. Rectal Exam: Not done EXTREMITIES: No bone or joint deformity; age-appropriate arthropathy of the hands and knees; no edema; no ulcerations.  There is no calf tenderness. Genitalia: not examined PULSES: 2+ and symmetric SKIN: Normal hydration no rash or ulceration CNS: Cranial nerves 2-12 grossly intact no focal lateralizing neurologic deficit.  Speech is fluent; uvula elevated with phonation, facial symmetry and tongue midline. DTR are normal bilaterally, cerebella exam is intact, barbinski is negative and strengths are equaled bilaterally.  No sensory loss.   Labs on Admission:  Basic Metabolic Panel:  Recent Labs Lab 09/19/15 0200  NA 142  K 4.1  CL 102  CO2 30  GLUCOSE 131*  BUN 13  CREATININE 0.77  CALCIUM 9.3   CBC:  Recent  Labs Lab 09/19/15 0200  WBC 13.7*  NEUTROABS 11.9*  HGB 14.3  HCT 43.1  MCV 95.1  PLT 355    Radiological Exams on Admission: Ct Angio Chest Pe W/cm &/or Wo Cm  09/19/2015  CLINICAL DATA:  Awoke with right-sided chest pain. Elevated D-dimer. EXAM: CT ANGIOGRAPHY CHEST WITH CONTRAST TECHNIQUE: Multidetector CT imaging of the chest was performed using the standard protocol during bolus administration of intravenous contrast. Multiplanar CT image reconstructions and MIPs were obtained to evaluate the vascular anatomy. CONTRAST:  137mL OMNIPAQUE IOHEXOL 350 MG/ML SOLN COMPARISON:  Radiograph earlier this day. FINDINGS: No filling defects in the central pulmonary arteries. Breathing motion artifact, soft tissue attenuation and contrast bolus timing partially limits evaluation of the distal branches. Thoracic aorta is normal in caliber. No evidence of dissection. No pericardial effusion. Prominent right hilar lymph node measures 10 mm short axis. No mediastinal adenopathy. Dense  consolidation in the right lower lobe with air bronchograms consistent with pneumonia. Small adjacent right pleural effusion. Scattered linear opacities throughout both lungs consistent with atelectasis. No left pleural effusion. Breathing motion artifact partially obscures detailed evaluation. Evaluation of the upper abdomen demonstrates no acute abnormalities. There are no acute or suspicious osseous abnormalities. Motion artifact through the sternum. Postsurgical change in the cervical spine, partially included. Review of the MIP images confirms the above findings. IMPRESSION: 1. No central pulmonary embolus. 2. Dense right lower lobe consolidation consistent with pneumonia. Small right pleural effusion. Followup PA and lateral chest X-ray is recommended in 3-4 weeks following trial of antibiotic therapy to ensure resolution and exclude underlying malignancy. 3. Scattered atelectasis throughout both lungs. Breathing motion artifact partially obscures detailed evaluation. Electronically Signed   By: Jeb Levering M.D.   On: 09/19/2015 03:31   Dg Chest Portable 1 View  09/19/2015  CLINICAL DATA:  Acute onset of stabbing right rib cage pain. Initial encounter. EXAM: PORTABLE CHEST 1 VIEW COMPARISON:  Chest radiograph performed 04/08/2009 FINDINGS: The lungs are mildly hypoexpanded. Bibasilar airspace opacities, right greater than left, may reflect pneumonia or pulmonary edema. Underlying vascular congestion is noted. A small right pleural effusion is suspected. No pneumothorax is seen. The cardiomediastinal silhouette is borderline normal in size. No acute osseous abnormalities are seen. IMPRESSION: Lungs mildly hypoexpanded. Vascular congestion noted. Bibasilar airspace opacities, right greater than left, may reflect pneumonia or pulmonary edema. Suspect small right pleural effusion. Electronically Signed   By: Garald Balding M.D.   On: 09/19/2015 02:16    EKG: Independently reviewed.    Assessment/Plan Present on Admission:  . Chest pain on respiration . CAP (community acquired pneumonia) . Hypothyroidism . OBESITY NOS . TOBACCO ABUSE . Carpal tunnel syndrome  PLAN:   Chest pain:  I suspect pleurisy with her PNA.  Will Tx with IV antibiotics and pain meds.  Unlikely to be ACS, but will cycle her troponins.  CAP:  Will continue with IV Rocpehin and Zithromax.    Will need follow up CXR after Tx.  Hypothyrodism:  WIll continue with supplement, and check TSH.   Chronic pain syndrome:  Cautioned, and will follow up with pain physician.  Will continue her home narcotics, and add PRN Dilaudid.    Other plans as per orders. Code Status: Oda Kilts, MD. FACP [facility] Pager [phone] 7pm to 7am.  09/19/2015, 4:58 AM

## 2015-09-19 NOTE — Progress Notes (Signed)
Patient is a 55 year old woman with a history of chronic back pain-on chronic opiates, COPD, GERD, depression with anxiety who was admitted by Dr. Truman Hayward this morning for a chief complaint of pleuritic chest pain. She was found to have an elevated d-dimer on her labs. However CT angiogram of the chest revealed no pulmonary embolus. It did reveal a dense right lower lobe pneumonia and a small pleural effusion. Patient was briefly examined and seen. Her chart, vital signs, laboratory studies were reviewed. Agree with current management.

## 2015-09-19 NOTE — ED Provider Notes (Signed)
CSN: [id number]     Arrival date & time 09/19/15  0052 History   First MD Initiated Contact with Patient 09/19/15 [id number]    Chief Complaint  Patient presents with  . Chest Pain  . Shortness of Breath     (Consider location/radiation/quality/duration/timing/severity/associated sxs/prior Treatment) Patient is a 55 y.o. female presenting with chest pain and shortness of breath.  Chest Pain Associated symptoms: shortness of breath   Shortness of Breath Associated symptoms: chest pain    patient reports she was awakened about 1 day and with acute sudden right lateral chest wall pain that she describes as sharp. She states it hurts with deep breathing and movement. She denies any coughing although she states she did have a low-grade fever when she was seen at her PCP office yesterday, January 5. She states she was seen for URI symptoms which she describes only as a chest tightness. She denies any coughing or rhinorrhea or sore throat. She states she has been having some wheezing. She denies any possible injury or change in activity. She states she's never had this pain before. Patient is not on oxygen at home.   PCP Dr Chapman Fitch  Past Medical History  Diagnosis Date  . Asthma   . COPD (chronic obstructive pulmonary disease) ([place])   . Emphysema of lung ([place])   . GERD (gastroesophageal reflux disease)   . Thyroid disease     hypo thyroid  . Nerve damage     right leg  . Neuromuscular disorder ([facility])     fibromyalgia  . Anxiety   . Carpal tunnel syndrome 01/13/2015    Bilateral   Past Surgical History  Procedure Laterality Date  . Anterior cervical decomp/discectomy fusion      L4, L5   . Tonsillectomy     Family History  Problem Relation Age of Onset  . Colon cancer Neg Hx   . Esophageal cancer Neg Hx   . Stomach cancer Neg Hx   . Rectal cancer Neg Hx    Social History  Substance Use Topics  . Smoking status: Former Smoker    Quit date: 11/22/2007  . Smokeless tobacco: Never Used   . Alcohol Use: No   Used to smoke 1-1/2-2 packs per day but quit 5 years ago and now just uses E cigarettes Patient is on disability for chronic low back pain  OB History    No data available     Review of Systems  Respiratory: Positive for shortness of breath.   Cardiovascular: Positive for chest pain.  All other systems reviewed and are negative.     Allergies  Celebrex; Nsaids; and Percocet  Home Medications   Prior to Admission medications   Medication Sig Start Date End Date Taking? Authorizing Provider  buPROPion (WELLBUTRIN SR) 200 MG 12 hr tablet Take 200 mg by mouth 2 (two) times daily.    Historical Provider, MD  cephALEXin (KEFLEX) 500 MG capsule Take 1 capsule (500 mg total) by mouth 4 (four) times daily. For 7 days 03/15/15   Tammy Triplett, PA-C  diazepam (VALIUM) 5 MG tablet Take 5 mg by mouth daily as needed. 06/13/13   Historical Provider, MD  gabapentin (NEURONTIN) 600 MG tablet Take 600 mg by mouth 3 (three) times daily.    Historical Provider, MD  levothyroxine (SYNTHROID, LEVOTHROID) 112 MCG tablet Take 112 mcg by mouth daily before breakfast.    Historical Provider, MD  morphine (MS CONTIN) 60 MG 12 hr tablet Take 60 mg by  mouth 3 (three) times daily.    Historical Provider, MD  morphine (MSIR) 15 MG tablet Take 15 mg by mouth every 4 (four) hours as needed for severe pain.    Historical Provider, MD  sertraline (ZOLOFT) 100 MG tablet Take 100 mg by mouth daily. 03/14/14   Historical Provider, MD  tizanidine (ZANAFLEX) 2 MG capsule Take 2 mg by mouth 3 (three) times daily.    Historical Provider, MD  traMADol (ULTRAM) 50 MG tablet Take 1 tablet (50 mg total) by mouth every 6 (six) hours as needed. 03/15/15   Tammy Triplett, PA-C  traZODone (DESYREL) 100 MG tablet Take 100 mg by mouth at bedtime as needed for sleep.     Historical Provider, MD   BP 127/88 mmHg  Pulse 104  Temp(Src) 98 F (36.7 C) (Oral)  Resp 26  Ht 5\' 6"  (1.676 m)  Wt 240 lb (108.863 kg)   BMI 38.76 kg/m2  SpO2 96%  Vital signs normal except for tachycardia  Physical Exam  Constitutional: She is oriented to person, place, and time. She appears well-developed and well-nourished.  Non-toxic appearance. She does not appear ill. She appears distressed.  Clutching her chest, audible groaning with each breath  HENT:  Head: Normocephalic and atraumatic.  Right Ear: External ear normal.  Left Ear: External ear normal.  Nose: Nose normal. No mucosal edema or rhinorrhea.  Mouth/Throat: Oropharynx is clear and moist and mucous membranes are normal. No dental abscesses or uvula swelling.  Eyes: Conjunctivae and EOM are normal. Pupils are equal, round, and reactive to light.  Neck: Normal range of motion and full passive range of motion without pain. Neck supple.  Cardiovascular: Normal rate, regular rhythm and normal heart sounds.  Exam reveals no gallop and no friction rub.   No murmur heard. Pulmonary/Chest: Tachypnea noted. She has decreased breath sounds. She has no wheezes. She has no rhonchi. She has no rales. She exhibits no tenderness and no crepitus.  Abdominal: Soft. Normal appearance and bowel sounds are normal. She exhibits no distension. There is no tenderness. There is no rebound and no guarding.  Musculoskeletal: Normal range of motion. She exhibits no edema or tenderness.  Moves all extremities well.   Neurological: She is alert and oriented to person, place, and time. She has normal strength. No cranial nerve deficit.  Skin: Skin is warm, dry and intact. No rash noted. No erythema. No pallor.  Psychiatric: She has a normal mood and affect. Her speech is normal and behavior is normal. Her mood appears not anxious.  Nursing note and vitals reviewed.   ED Course  Procedures (including critical care time)  Medications  morphine 4 MG/ML injection 4 mg (not administered)  cefTRIAXone (ROCEPHIN) 1 g in dextrose 5 % 50 mL IVPB (1 g Intravenous New Bag/Given 09/19/15 0358)   azithromycin (ZITHROMAX) 500 mg in dextrose 5 % 250 mL IVPB (not administered)  dexamethasone (DECADRON) injection 10 mg (10 mg Intravenous Given 09/19/15 0235)  fentaNYL (SUBLIMAZE) injection 50 mcg (50 mcg Intravenous Given 09/19/15 0235)  iohexol (OMNIPAQUE) 350 MG/ML injection 100 mL (100 mLs Intravenous Contrast Given 09/19/15 0323)    Patient has allergies to some nonsteroidal anti-inflammatory drugs. Her pain was felt to be pleuritic. She was given IV Decadron and fentanyl for pain. The nurse reports no improvement of pain she then was given morphine which she already is on IV. Due to her elevated d-dimer and abnormal chest x-ray CT scan of her chest was done.  After  reviewing patient's CT scan she was started on community-acquired pneumonia antibiotics. She had been hypoxic in triage with pulse ox 88% and was placed on oxygen 4 L/m nasal cannula by nursing staff.  Patient was given the results of her CT scan. Due to her hypoxia we discussed admission and she is agreeable. She states her pain is getting better. Now, she states she has had a cough, but has been unable to cough b/o pain, despite me asking her several times if she had been coughing.   04:31 Dr Marin Comment, will admit and he will do bed request.   Labs Review Results for orders placed or performed during the hospital encounter of 09/19/15  CBC with Differential  Result Value Ref Range   WBC 13.7 (H) 4.0 - 10.5 K/uL   RBC 4.53 3.87 - 5.11 MIL/uL   Hemoglobin 14.3 12.0 - 15.0 g/dL   HCT 43.1 36.0 - 46.0 %   MCV 95.1 78.0 - 100.0 fL   MCH 31.6 26.0 - 34.0 pg   MCHC 33.2 30.0 - 36.0 g/dL   RDW 12.0 11.5 - 15.5 %   Platelets 355 150 - 400 K/uL   Neutrophils Relative % 87 %   Neutro Abs 11.9 (H) 1.7 - 7.7 K/uL   Lymphocytes Relative 9 %   Lymphs Abs 1.3 0.7 - 4.0 K/uL   Monocytes Relative 4 %   Monocytes Absolute 0.6 0.1 - 1.0 K/uL   Eosinophils Relative 0 %   Eosinophils Absolute 0.0 0.0 - 0.7 K/uL   Basophils Relative 0 %    Basophils Absolute 0.0 0.0 - 0.1 K/uL  Basic metabolic panel  Result Value Ref Range   Sodium 142 135 - 145 mmol/L   Potassium 4.1 3.5 - 5.1 mmol/L   Chloride 102 101 - 111 mmol/L   CO2 30 22 - 32 mmol/L   Glucose, Bld 131 (H) 65 - 99 mg/dL   BUN 13 6 - 20 mg/dL   Creatinine, Ser 0.77 0.44 - 1.00 mg/dL   Calcium 9.3 8.9 - 10.3 mg/dL   GFR calc non Af Amer >60 >60 mL/min   GFR calc Af Amer >60 >60 mL/min   Anion gap 10 5 - 15  D-dimer, quantitative (not at [facility])  Result Value Ref Range   D-Dimer, Quant 3.43 (H) 0.00 - 0.50 ug/mL-FEU    Laboratory interpretation all normal except leukocytosis, elevated d-dimer    Imaging Review Ct Angio Chest Pe W/cm &/or Wo Cm  09/19/2015  CLINICAL DATA:  Awoke with right-sided chest pain. Elevated D-dimer. EXAM: CT ANGIOGRAPHY CHEST WITH CONTRAST TECHNIQUE: Multidetector CT imaging of the chest was performed using the standard protocol during bolus administration of intravenous contrast. Multiplanar CT image reconstructions and MIPs were obtained to evaluate the vascular anatomy. CONTRAST:  138mL OMNIPAQUE IOHEXOL 350 MG/ML SOLN COMPARISON:  Radiograph earlier this day. FINDINGS: No filling defects in the central pulmonary arteries. Breathing motion artifact, soft tissue attenuation and contrast bolus timing partially limits evaluation of the distal branches. Thoracic aorta is normal in caliber. No evidence of dissection. No pericardial effusion. Prominent right hilar lymph node measures 10 mm short axis. No mediastinal adenopathy. Dense consolidation in the right lower lobe with air bronchograms consistent with pneumonia. Small adjacent right pleural effusion. Scattered linear opacities throughout both lungs consistent with atelectasis. No left pleural effusion. Breathing motion artifact partially obscures detailed evaluation. Evaluation of the upper abdomen demonstrates no acute abnormalities. There are no acute or suspicious osseous abnormalities. Motion  artifact  through the sternum. Postsurgical change in the cervical spine, partially included. Review of the MIP images confirms the above findings. IMPRESSION: 1. No central pulmonary embolus. 2. Dense right lower lobe consolidation consistent with pneumonia. Small right pleural effusion. Followup PA and lateral chest X-ray is recommended in 3-4 weeks following trial of antibiotic therapy to ensure resolution and exclude underlying malignancy. 3. Scattered atelectasis throughout both lungs. Breathing motion artifact partially obscures detailed evaluation. Electronically Signed   By: Jeb Levering M.D.   On: 09/19/2015 03:31   Dg Chest Portable 1 View  09/19/2015  CLINICAL DATA:  Acute onset of stabbing right rib cage pain. Initial encounter. EXAM: PORTABLE CHEST 1 VIEW COMPARISON:  Chest radiograph performed 04/08/2009 FINDINGS: The lungs are mildly hypoexpanded. Bibasilar airspace opacities, right greater than left, may reflect pneumonia or pulmonary edema. Underlying vascular congestion is noted. A small right pleural effusion is suspected. No pneumothorax is seen. The cardiomediastinal silhouette is borderline normal in size. No acute osseous abnormalities are seen. IMPRESSION: Lungs mildly hypoexpanded. Vascular congestion noted. Bibasilar airspace opacities, right greater than left, may reflect pneumonia or pulmonary edema. Suspect small right pleural effusion. Electronically Signed   By: Garald Balding M.D.   On: 09/19/2015 02:16   I have personally reviewed and evaluated these images and lab results as part of my medical decision-making.   EKG Interpretation None       ED ECG REPORT   Date: 09/19/2015  Rate: 88  Rhythm: normal sinus rhythm  QRS Axis: normal  Intervals: normal  ST/T Wave abnormalities: nonspecific T wave changes  Conduction Disutrbances:low voltage precordial leads  Narrative Interpretation:   Old EKG Reviewed: unchanged from 2004  I have personally reviewed the EKG  tracing and agree with the computerized printout as noted.   MDM   Final diagnoses:  Pleuritic chest pain  CAP (community acquired pneumonia)  Hypoxia    Plan admission  Rolland Porter, MD, Barbette Or, MD 09/19/15 [phone]

## 2015-09-19 NOTE — ED Notes (Signed)
[name] [name] [phone]

## 2015-09-19 NOTE — Progress Notes (Addendum)
Patient off of Oxygen lying in bed ,saturation 83, respirations 28-32 hr 98 , making grunt with each breath.

## 2015-09-20 DIAGNOSIS — J189 Pneumonia, unspecified organism: Secondary | ICD-10-CM | POA: Diagnosis not present

## 2015-09-20 DIAGNOSIS — J9601 Acute respiratory failure with hypoxia: Secondary | ICD-10-CM | POA: Diagnosis not present

## 2015-09-20 DIAGNOSIS — G894 Chronic pain syndrome: Secondary | ICD-10-CM | POA: Diagnosis not present

## 2015-09-20 DIAGNOSIS — J948 Other specified pleural conditions: Secondary | ICD-10-CM | POA: Diagnosis not present

## 2015-09-20 DIAGNOSIS — F418 Other specified anxiety disorders: Secondary | ICD-10-CM | POA: Diagnosis present

## 2015-09-20 DIAGNOSIS — E669 Obesity, unspecified: Secondary | ICD-10-CM | POA: Diagnosis not present

## 2015-09-20 DIAGNOSIS — R071 Chest pain on breathing: Secondary | ICD-10-CM | POA: Diagnosis not present

## 2015-09-20 LAB — COMPREHENSIVE METABOLIC PANEL
ALT: 21 U/L (ref 14–54)
AST: 16 U/L (ref 15–41)
Albumin: 3 g/dL — ABNORMAL LOW (ref 3.5–5.0)
Alkaline Phosphatase: 69 U/L (ref 38–126)
Anion gap: 9 (ref 5–15)
BUN: 10 mg/dL (ref 6–20)
CO2: 28 mmol/L (ref 22–32)
Calcium: 8.4 mg/dL — ABNORMAL LOW (ref 8.9–10.3)
Chloride: 101 mmol/L (ref 101–111)
Creatinine, Ser: 0.7 mg/dL (ref 0.44–1.00)
GFR calc Af Amer: >60 mL/min (ref >60)
GFR calc non Af Amer: >60 mL/min (ref >60)
Glucose, Bld: 137 mg/dL — ABNORMAL HIGH (ref 65–99)
Potassium: 3.6 mmol/L (ref 3.5–5.1)
Sodium: 138 mmol/L (ref 135–145)
Total Bilirubin: 0.9 mg/dL (ref 0.3–1.2)
Total Protein: 6.7 g/dL (ref 6.5–8.1)

## 2015-09-20 LAB — CBC
HCT: 41.3 % (ref 36.0–46.0)
Hemoglobin: 13.2 g/dL (ref 12.0–15.0)
MCH: 31.1 pg (ref 26.0–34.0)
MCHC: 32 g/dL (ref 30.0–36.0)
MCV: 97.4 fL (ref 78.0–100.0)
Platelets: 361 10*3/uL (ref 150–400)
RBC: 4.24 MIL/uL (ref 3.87–5.11)
RDW: 12.5 % (ref 11.5–15.5)
WBC: 21.1 10*3/uL — ABNORMAL HIGH (ref 4.0–10.5)

## 2015-09-20 MED ORDER — METHYLPREDNISOLONE SODIUM SUCC 40 MG IJ SOLR
40.0000 mg | INTRAMUSCULAR | Status: DC
  Administered 2015-09-20 – 2015-09-23 (×10): 40 mg via INTRAVENOUS
  Filled 2015-09-20 (×11): qty 1

## 2015-09-20 MED ORDER — HYDROMORPHONE HCL 1 MG/ML IJ SOLN: 0.5000 mg | Freq: Four times a day (QID) | INTRAMUSCULAR | Status: DC | PRN

## 2015-09-20 MED ORDER — DIAZEPAM 2 MG PO TABS
2.0000 mg | ORAL_TABLET | Freq: Three times a day (TID) | ORAL | Status: DC | PRN
  Administered 2015-09-24 – 2015-09-28 (×5): 2 mg via ORAL
  Filled 2015-09-20 (×5): qty 1

## 2015-09-20 MED ORDER — MORPHINE SULFATE ER 60 MG PO TBCR
60.0000 mg | EXTENDED_RELEASE_TABLET | Freq: Two times a day (BID) | ORAL | Status: DC
  Administered 2015-09-20 – 2015-09-29 (×17): 60 mg via ORAL
  Filled 2015-09-20: qty 1
  Filled 2015-09-20: qty 2
  Filled 2015-09-20: qty 1
  Filled 2015-09-20: qty 2
  Filled 2015-09-20: qty 1
  Filled 2015-09-20: qty 2
  Filled 2015-09-20 (×2): qty 1
  Filled 2015-09-20: qty 2
  Filled 2015-09-20 (×3): qty 1
  Filled 2015-09-20: qty 2
  Filled 2015-09-20: qty 1
  Filled 2015-09-20: qty 2
  Filled 2015-09-20: qty 1
  Filled 2015-09-20: qty 2

## 2015-09-20 MED ORDER — TRAZODONE HCL 50 MG PO TABS
25.0000 mg | ORAL_TABLET | Freq: Every evening | ORAL | Status: DC | PRN
  Administered 2015-09-29: 25 mg via ORAL
  Filled 2015-09-20: qty 1

## 2015-09-20 MED ORDER — MORPHINE SULFATE (PF) 4 MG/ML IV SOLN: 4.0000 mg | INTRAVENOUS | Status: DC | PRN

## 2015-09-20 MED ORDER — METHOCARBAMOL 500 MG PO TABS
500.0000 mg | ORAL_TABLET | Freq: Three times a day (TID) | ORAL | Status: DC | PRN
  Administered 2015-09-26 – 2015-09-28 (×3): 500 mg via ORAL
  Filled 2015-09-20 (×3): qty 1

## 2015-09-20 MED ORDER — LEVALBUTEROL HCL 0.63 MG/3ML IN NEBU
0.6300 mg | INHALATION_SOLUTION | Freq: Four times a day (QID) | RESPIRATORY_TRACT | Status: DC
  Administered 2015-09-20 – 2015-09-24 (×15): 0.63 mg via RESPIRATORY_TRACT
  Filled 2015-09-20 (×16): qty 3

## 2015-09-20 NOTE — Progress Notes (Signed)
[facility] PROGRESS NOTE  [name] [name] [id number] DOB: 14-Feb-1961 DOA: 09/19/2015 PCP: Antony Blackbird, MD    Code Status: Full code Family Communication: Discussed with patient; family not available Disposition Plan: Discharge when clinically appropriate   Consultants:  None  Procedures:  None  Antibiotics:  Azithromycin 09/19/15  Rocephin 09/19/15  HPI/Subjective: When I enter the room, the patient is laying flat moaning. She complains of right-sided pleurisy. Her oxygen is also off. She has a nonproductive cough.  Objective: Filed Vitals:   09/20/15 0225 09/20/15 0504  BP:  150/81  Pulse: 94 107  Temp:  99.5 F (37.5 C)  Resp: 24 20   oxygen saturation 81% on room air which increased to 90% on 2 L of oxygen.  Intake/Output Summary (Last 24 hours) at 09/20/15 1019 Last data filed at 09/20/15 0501  Gross per 24 hour  Intake    480 ml  Output    700 ml  Net   -220 ml   Filed Weights   09/19/15 0106 09/19/15 0638  Weight: 108.863 kg (240 lb) 108.228 kg (238 lb 9.6 oz)    Exam:   General:  Obese 55 year old woman who appears ill, but no significant distress.  Cardiovascular: S1, S2, with borderline tachycardia.  Respiratory: Mid lobe crackles on the right and occasional wheezes; breathing nonlabored at rest.  Abdomen: Obese, positive bowel sounds, soft, nontender, nondistended.  Musculoskeletal/extremities: No pedal edema.  Neurologic: She is alert and oriented 3, but appears ill and deconditioned. Cranial nerves II through XII are grossly intact.   Data Reviewed: Basic Metabolic Panel:  Recent Labs Lab 09/19/15 0200 09/20/15 0613  NA 142 138  K 4.1 3.6  CL 102 101  CO2 30 28  GLUCOSE 131* 137*  BUN 13 10  CREATININE 0.77 0.70  CALCIUM 9.3 8.4*   Liver Function Tests:  Recent Labs Lab 09/20/15 0613  AST 16  ALT 21  ALKPHOS 69  BILITOT 0.9  PROT 6.7  ALBUMIN 3.0*   No results for input(s): LIPASE, AMYLASE in the last  168 hours. No results for input(s): AMMONIA in the last 168 hours. CBC:  Recent Labs Lab 09/19/15 0200 09/20/15 0613  WBC 13.7* 21.1*  NEUTROABS 11.9*  --   HGB 14.3 13.2  HCT 43.1 41.3  MCV 95.1 97.4  PLT 355 361   Cardiac Enzymes:  Recent Labs Lab 09/19/15 0521 09/19/15 1051 09/19/15 1654  TROPONINI <0.03 <0.03 <0.03   BNP (last 3 results) No results for input(s): BNP in the last 8760 hours.  ProBNP (last 3 results) No results for input(s): PROBNP in the last 8760 hours.  CBG: No results for input(s): GLUCAP in the last 168 hours.  No results found for this or any previous visit (from the past 240 hour(s)).   Studies: Ct Angio Chest Pe W/cm &/or Wo Cm  09/19/2015  CLINICAL DATA:  Awoke with right-sided chest pain. Elevated D-dimer. EXAM: CT ANGIOGRAPHY CHEST WITH CONTRAST TECHNIQUE: Multidetector CT imaging of the chest was performed using the standard protocol during bolus administration of intravenous contrast. Multiplanar CT image reconstructions and MIPs were obtained to evaluate the vascular anatomy. CONTRAST:  137mL OMNIPAQUE IOHEXOL 350 MG/ML SOLN COMPARISON:  Radiograph earlier this day. FINDINGS: No filling defects in the central pulmonary arteries. Breathing motion artifact, soft tissue attenuation and contrast bolus timing partially limits evaluation of the distal branches. Thoracic aorta is normal in caliber. No evidence of dissection. No pericardial effusion. Prominent right hilar lymph node measures 10  mm short axis. No mediastinal adenopathy. Dense consolidation in the right lower lobe with air bronchograms consistent with pneumonia. Small adjacent right pleural effusion. Scattered linear opacities throughout both lungs consistent with atelectasis. No left pleural effusion. Breathing motion artifact partially obscures detailed evaluation. Evaluation of the upper abdomen demonstrates no acute abnormalities. There are no acute or suspicious osseous abnormalities.  Motion artifact through the sternum. Postsurgical change in the cervical spine, partially included. Review of the MIP images confirms the above findings. IMPRESSION: 1. No central pulmonary embolus. 2. Dense right lower lobe consolidation consistent with pneumonia. Small right pleural effusion. Followup PA and lateral chest X-ray is recommended in 3-4 weeks following trial of antibiotic therapy to ensure resolution and exclude underlying malignancy. 3. Scattered atelectasis throughout both lungs. Breathing motion artifact partially obscures detailed evaluation. Electronically Signed   By: Jeb Levering M.D.   On: 09/19/2015 03:31   Dg Chest Portable 1 View  09/19/2015  CLINICAL DATA:  Acute onset of stabbing right rib cage pain. Initial encounter. EXAM: PORTABLE CHEST 1 VIEW COMPARISON:  Chest radiograph performed 04/08/2009 FINDINGS: The lungs are mildly hypoexpanded. Bibasilar airspace opacities, right greater than left, may reflect pneumonia or pulmonary edema. Underlying vascular congestion is noted. A small right pleural effusion is suspected. No pneumothorax is seen. The cardiomediastinal silhouette is borderline normal in size. No acute osseous abnormalities are seen. IMPRESSION: Lungs mildly hypoexpanded. Vascular congestion noted. Bibasilar airspace opacities, right greater than left, may reflect pneumonia or pulmonary edema. Suspect small right pleural effusion. Electronically Signed   By: Garald Balding M.D.   On: 09/19/2015 02:16    Scheduled Meds: . azithromycin  500 mg Intravenous Q24H  . buPROPion  200 mg Oral BID  . cefTRIAXone (ROCEPHIN)  IV  1 g Intravenous Q24H  . enoxaparin (LOVENOX) injection  50 mg Subcutaneous Q24H  . gabapentin  600 mg Oral TID  . levothyroxine  112 mcg Oral QAC breakfast  . morphine  60 mg Oral TID  . morphine  4 mg Intravenous Once  . sertraline  100 mg Oral Daily   Continuous Infusions: . sodium chloride 75 mL/hr at 09/19/15 2021   Assessment and  plan:  Principal Problem:   CAP (community acquired pneumonia) Active Problems:   Acute respiratory failure with hypoxia (HCC)   Hypothyroidism   OBESITY NOS   TOBACCO ABUSE   Carpal tunnel syndrome   Chest pain on respiration   Chronic pain syndrome   Hypothyroidism, adult   1. Community-acquired pneumonia with pleurisy. On admission, CT angiogram of the patient's chest revealed no pulmonary embolus, but it did reveal a dense right lower lobe pneumonia and a small pleural effusion. She was afebrile in the ED. She was hypoxic with an oxygen saturation of 88% on room air. Blood cultures were not ordered. Her white blood cell count was 13.7. -She was given one 10 mg dose of Decadron. Patient was started on Rocephin and azithromycin. -Her white blood cell count has increased, but this may have been from the IV therapy. -I believe she would benefit from further steroid treatment empirically, so will add Solu-Medrol every 12 hours. -Patient was encouraged to get out of bed to the chair more frequently. Will order incentive spirometry.  Acute respiratory failure with hypoxia. Patient's oxygen saturation was 80% on room air in the ED. Patient was started on nasal cannula oxygen. However, on 2 occasions when I entered the room, her oxygen was off. Recheck of her oxygen saturation was 81% on  room air. -Patient was placed back on oxygen. She was encouraged to keep her oxygen on. -I believe her chronic narcotics could be contributing to her hypoxia, so MS Contin will be decreased to every 12 hours; IV Dilaudid will be discontinued; will add when necessary morphine.  Tobacco abuse. The patient was advised to stop smoking.  Chronic pain syndrome. Patient is treated chronically with MS Contin 60 mg every 8 hours, when necessary MSIR, Zanaflex, and gabapentin. Zanaflex was not restarted. In light of her hypoxia and her other psychotropic medications, it was decided that the MS Contin would be  decreased every 12 hours and Dilaudid would be discontinued in favor of when necessary IV morphine. -We'll also add when necessary Robaxin.  Chronic depression with anxiety. Patient is treated chronically with Zoloft, Wellbutrin, gabapentin.. All were continued. -When necessary diazepam and trazodone were added. However, in light of her hypoxia and ill appearance, will decrease the dose of diazepam and trazodone.  Hypothyroidism. The patient is treated chronically with Synthroid. It was continued. Her TSH was within normal limits.   Time spent: 35 minutes    [facility] Pager [phone]. If 7PM-7AM, please contact night-coverage at [email or web address], password [facility] 09/20/2015, 10:19 AM  LOS: 1 day

## 2015-09-20 NOTE — Progress Notes (Signed)
Patient still restless breathing 28 with grunt Saturation 94 on 4lpm/[place]. Will continue to monitor.

## 2015-09-21 ENCOUNTER — Inpatient Hospital Stay (HOSPITAL_COMMUNITY): Payer: PPO

## 2015-09-21 DIAGNOSIS — R0902 Hypoxemia: Secondary | ICD-10-CM | POA: Diagnosis not present

## 2015-09-21 DIAGNOSIS — E669 Obesity, unspecified: Secondary | ICD-10-CM | POA: Diagnosis not present

## 2015-09-21 DIAGNOSIS — J189 Pneumonia, unspecified organism: Secondary | ICD-10-CM | POA: Diagnosis not present

## 2015-09-21 DIAGNOSIS — J948 Other specified pleural conditions: Secondary | ICD-10-CM | POA: Diagnosis not present

## 2015-09-21 DIAGNOSIS — J9601 Acute respiratory failure with hypoxia: Secondary | ICD-10-CM | POA: Diagnosis not present

## 2015-09-21 DIAGNOSIS — R071 Chest pain on breathing: Secondary | ICD-10-CM | POA: Diagnosis not present

## 2015-09-21 DIAGNOSIS — G894 Chronic pain syndrome: Secondary | ICD-10-CM | POA: Diagnosis not present

## 2015-09-21 LAB — BLOOD GAS, ARTERIAL
Acid-Base Excess: 4.7 mmol/L — ABNORMAL HIGH (ref 0.0–2.0)
Bicarbonate: 28.2 mEq/L — ABNORMAL HIGH (ref 20.0–24.0)
Drawn by: 221791
O2 Content: 6 L/min
O2 Saturation: 94.7 %
Patient temperature: 37
TCO2: 16.4 mmol/L (ref 0–100)
pCO2 arterial: 46.4 mmHg — ABNORMAL HIGH (ref 35.0–45.0)
pH, Arterial: 7.413 (ref 7.350–7.450)
pO2, Arterial: 67.6 mmHg — ABNORMAL LOW (ref 80.0–100.0)

## 2015-09-21 LAB — CBC
HCT: 39.1 % (ref 36.0–46.0)
Hemoglobin: 12.5 g/dL (ref 12.0–15.0)
MCH: 31 pg (ref 26.0–34.0)
MCHC: 32 g/dL (ref 30.0–36.0)
MCV: 97 fL (ref 78.0–100.0)
Platelets: 338 10*3/uL (ref 150–400)
RBC: 4.03 MIL/uL (ref 3.87–5.11)
RDW: 12.3 % (ref 11.5–15.5)
WBC: 20.8 10*3/uL — ABNORMAL HIGH (ref 4.0–10.5)

## 2015-09-21 MED ORDER — IOHEXOL 350 MG/ML SOLN
100.0000 mL | Freq: Once | INTRAVENOUS | Status: AC | PRN
  Administered 2015-09-21: 100 mL via INTRAVENOUS

## 2015-09-21 MED ORDER — MORPHINE SULFATE (PF) 2 MG/ML IV SOLN: 2.0000 mg | INTRAVENOUS | Status: DC | PRN

## 2015-09-21 NOTE — Progress Notes (Addendum)
Bambie in [facility] lab states that pt. Has no IV access.  [facility] notified and they will call back with ETA on PICC line insertion. ETA confirmed that a nurse will arrive at 1830.

## 2015-09-21 NOTE — Progress Notes (Signed)
[facility] PROGRESS NOTE  [name] MIGHT [id number] DOB: Jul 05, 1961 DOA: 09/19/2015 PCP: Antony Blackbird, MD    Code Status: Full code Family Communication: Discussed with patient; family not available Disposition Plan: Discharge when clinically appropriate   Consultants:  None  Procedures:  None  Antibiotics:  Azithromycin 09/19/15>>  Rocephin 09/19/15>>  HPI/Subjective: The patient continues to have right-sided pleurisy, but no worse or no better even though her analgesics were decreased yesterday. She is short of breath with minimal activity.  Objective: Filed Vitals:   09/21/15 0500 09/21/15 0724  BP: 137/73   Pulse: 100 87  Temp: 98 F (36.7 C)   Resp: 20 20  Oxygen saturation 93% on 7 L of nasal cannula oxygen.  Intake/Output Summary (Last 24 hours) at 09/21/15 1155 Last data filed at 09/21/15 0549  Gross per 24 hour  Intake    240 ml  Output    400 ml  Net   -160 ml   Filed Weights   09/19/15 0106 09/19/15 0638  Weight: 108.863 kg (240 lb) 108.228 kg (238 lb 9.6 oz)    Exam:   General:  Obese 55 year old woman who appears ill, but no significant distress. She appears more comfortable today.  Cardiovascular: S1, S2, with no murmurs rubs or gallops.  Respiratory: Globally decreased breath sounds on the right with occasional breakthrough crackles and wheezes; breathing nonlabored at rest.  Abdomen: Obese, positive bowel sounds, soft, nontender, nondistended.  Musculoskeletal/extremities: No pedal edema.  Neurologic: She was initially sleeping, but became easily alert and oriented 3, but appears ill and deconditioned. Cranial nerves II through XII are grossly intact.   Data Reviewed: Basic Metabolic Panel:  Recent Labs Lab 09/19/15 0200 09/20/15 0613  NA 142 138  K 4.1 3.6  CL 102 101  CO2 30 28  GLUCOSE 131* 137*  BUN 13 10  CREATININE 0.77 0.70  CALCIUM 9.3 8.4*   Liver Function Tests:  Recent Labs Lab 09/20/15 0613  AST  16  ALT 21  ALKPHOS 69  BILITOT 0.9  PROT 6.7  ALBUMIN 3.0*   No results for input(s): LIPASE, AMYLASE in the last 168 hours. No results for input(s): AMMONIA in the last 168 hours. CBC:  Recent Labs Lab 09/19/15 0200 09/20/15 0613 09/21/15 0621  WBC 13.7* 21.1* 20.8*  NEUTROABS 11.9*  --   --   HGB 14.3 13.2 12.5  HCT 43.1 41.3 39.1  MCV 95.1 97.4 97.0  PLT 355 361 338   Cardiac Enzymes:  Recent Labs Lab 09/19/15 0521 09/19/15 1051 09/19/15 1654  TROPONINI <0.03 <0.03 <0.03   BNP (last 3 results) No results for input(s): BNP in the last 8760 hours.  ProBNP (last 3 results) No results for input(s): PROBNP in the last 8760 hours.  CBG: No results for input(s): GLUCAP in the last 168 hours.  No results found for this or any previous visit (from the past 240 hour(s)).   Studies: No results found.  Scheduled Meds: . azithromycin  500 mg Intravenous Q24H  . buPROPion  200 mg Oral BID  . cefTRIAXone (ROCEPHIN)  IV  1 g Intravenous Q24H  . enoxaparin (LOVENOX) injection  50 mg Subcutaneous Q24H  . gabapentin  600 mg Oral TID  . levalbuterol  0.63 mg Nebulization Q6H  . levothyroxine  112 mcg Oral QAC breakfast  . methylPREDNISolone (SOLU-MEDROL) injection  40 mg Intravenous [id number]  . morphine  60 mg Oral Q12H  . sertraline  100 mg Oral Daily   Continuous  Infusions: . sodium chloride 50 mL/hr (09/20/15 1024)   Assessment and plan:  Principal Problem:   CAP (community acquired pneumonia) Active Problems:   Acute respiratory failure with hypoxia (HCC)   Hypothyroidism   OBESITY NOS   TOBACCO ABUSE   Carpal tunnel syndrome   Chest pain on respiration   Chronic pain syndrome   Hypothyroidism, adult   Depression with anxiety   1. Community-acquired pneumonia with pleurisy. On admission, CT angiogram of the patient's chest revealed no pulmonary embolus, but it did reveal a dense right lower lobe pneumonia and a small pleural effusion. She was  afebrile in the ED. She was hypoxic with an oxygen saturation of 88% on room air. Blood cultures were not ordered. Her white blood cell count was 13.7. -She was given one 10 mg dose of Decadron. Patient was started on Rocephin and azithromycin. -Her white blood cell count has increased, but this may have been from the IV Decadron given in the ED. -I believed she would benefit from further steroid treatment empirically, so Solu-Medrol every 12 hours was started. -Incentive spirometry was ordered and the patient was encouraged to get out of bed to the chair more frequently.  Acute respiratory failure with hypoxia. Patient's oxygen saturation was 80% on room air in the ED. Patient was started on nasal cannula oxygen. However, on 2 occasions when I entered the room, her oxygen was off. Recheck of her oxygen saturation was 81% on room air on 09/20/15. -Patient was placed back on oxygen. She was encouraged to keep her oxygen on. -It was believed that  her chronic narcotics and benzos were contributing to her hypoxia, so MS Contin was decreased to every 12 hours; IV Dilaudid was discontinued; and when necessary diazepam was decreased to 2 mg rather than 5 mg every 8 hours.  -Small dosing of when necessary morphine was ordered. -Given her increasing oxygen needs, will order CT angiogram of her chest for further evaluation.   Tobacco abuse. The patient was advised to stop smoking.  Chronic pain syndrome. Patient is treated chronically with MS Contin 60 mg every 8 hours, when necessary MSIR, Zanaflex, and gabapentin. Zanaflex was not restarted. In light of her hypoxia and her other psychotropic medications, it was decided that the MS Contin would be decreased to every 12 hours and Dilaudid would be discontinued in favor of when necessary IV morphine. -When necessary Robaxin was added.  Chronic depression with anxiety. Patient is treated chronically with Zoloft, Wellbutrin, gabapentin.. All were  continued. -When necessary diazepam and trazodone were started on admission. However, in light of her hypoxia and ill appearance, the dose of diazepam and trazodone were significantly decreased.  Hypothyroidism. The patient is treated chronically with Synthroid. It was continued. Her TSH was within normal limits.   Time spent: 30 minutes    [place] [facility] Pager [phone]. If 7PM-7AM, please contact night-coverage at [email or web address], password [facility] 09/21/2015, 11:55 AM  LOS: 2 days

## 2015-09-21 NOTE — Progress Notes (Signed)
[facility] PROGRESS NOTE  [name] [name] [id number] DOB: 11/05/1960 DOA: 09/19/2015 PCP: Antony Blackbird, MD    Code Status: Full code Family Communication: Discussed with patient; family not available Disposition Plan: Discharge when clinically appropriate   Consultants:  None  Procedures:  None  Antibiotics:  Azithromycin 09/19/15>>  Rocephin 09/19/15>>  HPI/Subjective: The patient continues to have right-sided pleurisy, but no worse or no better even though her analgesics were decreased yesterday. She is short of breath with minimal activity.  Objective: Filed Vitals:   09/21/15 0500 09/21/15 0724  BP: 137/73   Pulse: 100 87  Temp: 98 F (36.7 C)   Resp: 20 20  Oxygen saturation 93% on 7 L of nasal cannula oxygen.  Intake/Output Summary (Last 24 hours) at 09/21/15 1209 Last data filed at 09/21/15 0549  Gross per 24 hour  Intake    240 ml  Output    400 ml  Net   -160 ml   Filed Weights   09/19/15 0106 09/19/15 0638  Weight: 108.863 kg (240 lb) 108.228 kg (238 lb 9.6 oz)    Exam:   General:  Obese 55 year old woman who appears ill, but no significant distress. She appears more comfortable today.  Cardiovascular: S1, S2, with no murmurs rubs or gallops.  Respiratory: Globally decreased breath sounds on the right with occasional breakthrough crackles and wheezes; breathing nonlabored at rest.  Abdomen: Obese, positive bowel sounds, soft, nontender, nondistended.  Musculoskeletal/extremities: No pedal edema.  Neurologic: She was initially sleeping, but became easily alert and oriented 3, but appears ill and deconditioned. Cranial nerves II through XII are grossly intact.   Data Reviewed: Basic Metabolic Panel:  Recent Labs Lab 09/19/15 0200 09/20/15 0613  NA 142 138  K 4.1 3.6  CL 102 101  CO2 30 28  GLUCOSE 131* 137*  BUN 13 10  CREATININE 0.77 0.70  CALCIUM 9.3 8.4*   Liver Function Tests:  Recent Labs Lab 09/20/15 0613  AST  16  ALT 21  ALKPHOS 69  BILITOT 0.9  PROT 6.7  ALBUMIN 3.0*   No results for input(s): LIPASE, AMYLASE in the last 168 hours. No results for input(s): AMMONIA in the last 168 hours. CBC:  Recent Labs Lab 09/19/15 0200 09/20/15 0613 09/21/15 0621  WBC 13.7* 21.1* 20.8*  NEUTROABS 11.9*  --   --   HGB 14.3 13.2 12.5  HCT 43.1 41.3 39.1  MCV 95.1 97.4 97.0  PLT 355 361 338   Cardiac Enzymes:  Recent Labs Lab 09/19/15 0521 09/19/15 1051 09/19/15 1654  TROPONINI <0.03 <0.03 <0.03   BNP (last 3 results) No results for input(s): BNP in the last 8760 hours.  ProBNP (last 3 results) No results for input(s): PROBNP in the last 8760 hours.  CBG: No results for input(s): GLUCAP in the last 168 hours.  No results found for this or any previous visit (from the past 240 hour(s)).   Studies: No results found.  Scheduled Meds: . azithromycin  500 mg Intravenous Q24H  . buPROPion  200 mg Oral BID  . cefTRIAXone (ROCEPHIN)  IV  1 g Intravenous Q24H  . enoxaparin (LOVENOX) injection  50 mg Subcutaneous Q24H  . gabapentin  600 mg Oral TID  . levalbuterol  0.63 mg Nebulization Q6H  . levothyroxine  112 mcg Oral QAC breakfast  . methylPREDNISolone (SOLU-MEDROL) injection  40 mg Intravenous [id number]  . morphine  60 mg Oral Q12H  . sertraline  100 mg Oral Daily   Continuous  Infusions: . sodium chloride 50 mL/hr (09/20/15 1024)   Assessment and plan:  Principal Problem:   CAP (community acquired pneumonia) Active Problems:   Acute respiratory failure with hypoxia (HCC)   Hypothyroidism   OBESITY NOS   TOBACCO ABUSE   Carpal tunnel syndrome   Chest pain on respiration   Chronic pain syndrome   Hypothyroidism, adult   Depression with anxiety   1. Community-acquired pneumonia with pleurisy. On admission, CT angiogram of the patient's chest revealed no pulmonary embolus, but it did reveal a dense right lower lobe pneumonia and a small pleural effusion. She was  afebrile in the ED. She was hypoxic with an oxygen saturation of 88% on room air. Blood cultures were not ordered. Her white blood cell count was 13.7. -She was given one 10 mg dose of Decadron. Patient was started on Rocephin and azithromycin. -Her white blood cell count has increased, but this may have been from the IV Decadron given in the ED. -I believed she would benefit from further steroid treatment empirically, so Solu-Medrol every 12 hours was started. -Incentive spirometry was ordered and the patient was encouraged to get out of bed to the chair more frequently. Xopenex was also started.  Acute respiratory failure with hypoxia. Patient's oxygen saturation was 80% on room air in the ED. Patient was started on nasal cannula oxygen. However, on 2 occasions when I entered the room, her oxygen was off. Recheck of her oxygen saturation was 81% on room air on 09/20/15. -Patient was placed back on oxygen. She was encouraged to keep her oxygen on. -It was believed that  her chronic narcotics and benzos were contributing to her hypoxia, so MS Contin was decreased to every 12 hours; IV Dilaudid was discontinued; and when necessary diazepam was decreased to 2 mg rather than 5 mg every 8 hours.  -Small dosing of when necessary morphine was ordered. -Given her increasing oxygen needs, will order CT angiogram of her chest for further evaluation.   Tobacco abuse. The patient was advised to stop smoking.  Chronic pain syndrome. Patient is treated chronically with MS Contin 60 mg every 8 hours, when necessary MSIR, Zanaflex, and gabapentin. Zanaflex was not restarted. In light of her hypoxia and her other psychotropic medications, it was decided that the MS Contin would be decreased to every 12 hours and Dilaudid would be discontinued in favor of when necessary IV morphine. -When necessary Robaxin was added.  Chronic depression with anxiety. Patient is treated chronically with Zoloft, Wellbutrin,  gabapentin.. All were continued. -When necessary diazepam and trazodone were started on admission. However, in light of her hypoxia and ill appearance, the dose of diazepam and trazodone were significantly decreased.  Hypothyroidism. The patient is treated chronically with Synthroid. It was continued. Her TSH was within normal limits.   Time spent: 30 minutes    [facility] Pager [phone]. If 7PM-7AM, please contact night-coverage at [email or web address], password [email or web address] 09/21/2015, 12:09 PM  LOS: 2 days

## 2015-09-21 NOTE — Progress Notes (Signed)
Pt changed over to 7lpm HFNC due to spo2 86% on 5lpm cann nurse informed

## 2015-09-21 NOTE — Progress Notes (Signed)
Peripheral vascular nurse, Campbell Riches spoke to Dr. Caryn Section about IV placement.  Dr. Sharlotte Alamo a peripheral IV for tonight, if PICC line needed, will assess tomorrow.

## 2015-09-22 ENCOUNTER — Encounter (HOSPITAL_COMMUNITY): Payer: Self-pay

## 2015-09-22 ENCOUNTER — Inpatient Hospital Stay (HOSPITAL_COMMUNITY): Payer: PPO

## 2015-09-22 DIAGNOSIS — G894 Chronic pain syndrome: Secondary | ICD-10-CM | POA: Diagnosis not present

## 2015-09-22 DIAGNOSIS — R071 Chest pain on breathing: Secondary | ICD-10-CM | POA: Diagnosis not present

## 2015-09-22 DIAGNOSIS — R091 Pleurisy: Secondary | ICD-10-CM | POA: Diagnosis not present

## 2015-09-22 DIAGNOSIS — J918 Pleural effusion in other conditions classified elsewhere: Secondary | ICD-10-CM

## 2015-09-22 DIAGNOSIS — J90 Pleural effusion, not elsewhere classified: Secondary | ICD-10-CM | POA: Diagnosis not present

## 2015-09-22 DIAGNOSIS — J948 Other specified pleural conditions: Secondary | ICD-10-CM | POA: Diagnosis not present

## 2015-09-22 DIAGNOSIS — J189 Pneumonia, unspecified organism: Secondary | ICD-10-CM | POA: Diagnosis present

## 2015-09-22 DIAGNOSIS — J869 Pyothorax without fistula: Secondary | ICD-10-CM | POA: Diagnosis present

## 2015-09-22 DIAGNOSIS — J9691 Respiratory failure, unspecified with hypoxia: Secondary | ICD-10-CM | POA: Diagnosis not present

## 2015-09-22 DIAGNOSIS — E669 Obesity, unspecified: Secondary | ICD-10-CM | POA: Diagnosis not present

## 2015-09-22 DIAGNOSIS — J9601 Acute respiratory failure with hypoxia: Secondary | ICD-10-CM | POA: Diagnosis not present

## 2015-09-22 LAB — BODY FLUID CELL COUNT WITH DIFFERENTIAL
Eos, Fluid: 0 %
Lymphs, Fluid: 11 %
Monocyte-Macrophage-Serous Fluid: 53 % (ref 50–90)
Neutrophil Count, Fluid: 36 % — ABNORMAL HIGH (ref 0–25)
Total Nucleated Cell Count, Fluid: 7830 cu mm — ABNORMAL HIGH (ref 0–1000)

## 2015-09-22 LAB — LACTATE DEHYDROGENASE, PLEURAL OR PERITONEAL FLUID: LD, Fluid: 3339 U/L — ABNORMAL HIGH (ref 3–23)

## 2015-09-22 LAB — PROTEIN, BODY FLUID: Total protein, fluid: 5.2 g/dL

## 2015-09-22 LAB — GLUCOSE, SEROUS FLUID: Glucose, Fluid: <20 mg/dL

## 2015-09-22 MED ORDER — VANCOMYCIN HCL IN DEXTROSE 1-5 GM/200ML-% IV SOLN
1000.0000 mg | Freq: Two times a day (BID) | INTRAVENOUS | Status: DC
  Administered 2015-09-22 – 2015-09-26 (×7): 1000 mg via INTRAVENOUS
  Filled 2015-09-22 (×9): qty 200

## 2015-09-22 MED ORDER — SODIUM CHLORIDE 0.9 % IJ SOLN: 10.0000 mL | INTRAMUSCULAR | Status: DC | PRN

## 2015-09-22 MED ORDER — SENNOSIDES-DOCUSATE SODIUM 8.6-50 MG PO TABS
2.0000 | ORAL_TABLET | Freq: Every day | ORAL | Status: DC
  Administered 2015-09-22 – 2015-09-26 (×4): 2 via ORAL
  Filled 2015-09-22 (×4): qty 2

## 2015-09-22 MED ORDER — LEVOFLOXACIN IN D5W 750 MG/150ML IV SOLN
750.0000 mg | INTRAVENOUS | Status: DC
  Administered 2015-09-22 – 2015-09-23 (×2): 750 mg via INTRAVENOUS
  Filled 2015-09-22 (×2): qty 150

## 2015-09-22 MED ORDER — ALBUTEROL SULFATE (2.5 MG/3ML) 0.083% IN NEBU
2.5000 mg | INHALATION_SOLUTION | RESPIRATORY_TRACT | Status: DC | PRN
  Filled 2015-09-22: qty 3

## 2015-09-22 MED ORDER — PIPERACILLIN-TAZOBACTAM 3.375 G IVPB
3.3750 g | Freq: Three times a day (TID) | INTRAVENOUS | Status: DC
  Administered 2015-09-22 – 2015-09-27 (×16): 3.375 g via INTRAVENOUS
  Filled 2015-09-22 (×20): qty 50

## 2015-09-22 MED ORDER — VANCOMYCIN HCL IN DEXTROSE 1-5 GM/200ML-% IV SOLN
1000.0000 mg | INTRAVENOUS | Status: AC
  Administered 2015-09-22 (×2): 1000 mg via INTRAVENOUS
  Filled 2015-09-22: qty 200

## 2015-09-22 MED ORDER — BISACODYL 10 MG RE SUPP
10.0000 mg | Freq: Every day | RECTAL | Status: DC | PRN
  Administered 2015-09-22: 10 mg via RECTAL
  Filled 2015-09-22: qty 1

## 2015-09-22 NOTE — Procedures (Signed)
PreOperative Dx: RIGHT pleural effusion Postoperative Dx: Loculated complex RIGHT pleural effusion Procedure:   US guided RIGHT thoracentesis Radiologist:  Thornton Papas Anesthesia:  10 ml of 1% lidocaine Specimen:  33 ml of serosanguinous RIGHT pleural fluid EBL:   < 1 ml Complications: None

## 2015-09-22 NOTE — Progress Notes (Signed)
[facility] PROGRESS NOTE  [name] [name] [id number] DOB: [birth year] DOA: 09/19/2015 PCP: Antony Blackbird, MD    Code Status: Full code Family Communication: Discussed with patient; family not available Disposition Plan: Discharge when clinically appropriate   Consultants:  Pulmonology  Procedures:  Thoracentesis, pending  Antibiotics:  Zosyn 09/22/15>>  Vancomycin 09/22/15>>  Levaquin 09/22/15>>  Azithromycin 09/19/15>> 09/22/15  Rocephin 09/19/15>> 09/22/15  HPI/Subjective: The patient continues to have right-sided pleurisy, but slightly less. She says she actually feels a little bit better, but still quite short of breath.  Objective: Filed Vitals:   09/21/15 2051 09/22/15 0557  BP: 159/56 127/73  Pulse: 91 87  Temp: 98.1 F (36.7 C) 98.9 F (37.2 C)  Resp: 20 20  Oxygen saturation 95% on 7 L of nasal cannula oxygen.  Intake/Output Summary (Last 24 hours) at 09/22/15 1253 Last data filed at 09/22/15 1241  Gross per 24 hour  Intake      0 ml  Output   1600 ml  Net  -1600 ml   Filed Weights   09/19/15 0106 09/19/15 0638  Weight: 108.863 kg (240 lb) 108.228 kg (238 lb 9.6 oz)    Exam:   General:  Obese 55 year old woman who appears ill, but she appears more comfortable today.  Cardiovascular: S1, S2, with no murmurs rubs or gallops.  Respiratory: Globally decreased breath sounds on the right with occasional breakthrough crackles and wheezes; breathing nonlabored at rest.  Abdomen: Obese, positive bowel sounds, soft, nontender, nondistended.  Musculoskeletal/extremities: No pedal edema.  Neurologic: She was initially sleeping, but became easily alert and oriented 3, but appears ill and deconditioned. Cranial nerves II through XII are grossly intact.   Data Reviewed: Basic Metabolic Panel:  Recent Labs Lab 09/19/15 0200 09/20/15 0613  NA 142 138  K 4.1 3.6  CL 102 101  CO2 30 28  GLUCOSE 131* 137*  BUN 13 10  CREATININE 0.77 0.70  CALCIUM  9.3 8.4*   Liver Function Tests:  Recent Labs Lab 09/20/15 0613  AST 16  ALT 21  ALKPHOS 69  BILITOT 0.9  PROT 6.7  ALBUMIN 3.0*   No results for input(s): LIPASE, AMYLASE in the last 168 hours. No results for input(s): AMMONIA in the last 168 hours. CBC:  Recent Labs Lab 09/19/15 0200 09/20/15 0613 09/21/15 0621  WBC 13.7* 21.1* 20.8*  NEUTROABS 11.9*  --   --   HGB 14.3 13.2 12.5  HCT 43.1 41.3 39.1  MCV 95.1 97.4 97.0  PLT 355 361 338   Cardiac Enzymes:  Recent Labs Lab 09/19/15 0521 09/19/15 1051 09/19/15 1654  TROPONINI <0.03 <0.03 <0.03   BNP (last 3 results) No results for input(s): BNP in the last 8760 hours.  ProBNP (last 3 results) No results for input(s): PROBNP in the last 8760 hours.  CBG: No results for input(s): GLUCAP in the last 168 hours.  No results found for this or any previous visit (from the past 240 hour(s)).   Studies: Ct Angio Chest Pe W/cm &/or Wo Cm  09/21/2015  CLINICAL DATA:  Persistent hypoxia. Decreased oxygen saturation with increasing oxygen requirement. EXAM: CT ANGIOGRAPHY CHEST WITH CONTRAST TECHNIQUE: Multidetector CT imaging of the chest was performed using the standard protocol during bolus administration of intravenous contrast. Multiplanar CT image reconstructions and MIPs were obtained to evaluate the vascular anatomy. CONTRAST:  110mL OMNIPAQUE IOHEXOL 350 MG/ML SOLN COMPARISON:  CT PE protocol 09/19/2015, 2 days prior FINDINGS: There are no filling defects within the pulmonary  arteries to suggest pulmonary embolus. Limited assessment of the lower lobe branches secondary to breathing motion artifact and consolidation on the right. Progressive consolidation and pleural effusion in the right hemithorax. Marked increasing in right pleural effusion, now moderate to large in circumferential. Consolidation in the right lower lobe concerning for pneumonia, with heterogeneous enhancement posteriorly. Small focus of air in the  periphery of the heterogeneity and may reflect developing abscess. Complete atelectasis or consolidation throughout the right middle lobe. Compressive atelectasis in the right upper lobe, with minimal ground-glass opacity in the aerated right upper lung. Scattered atelectasis and ground-glass opacities in the left lung. No left pleural effusion. Thoracic aorta is normal in caliber. There is no pericardial effusion. Development of mediastinal adenopathy in the interim is likely reactive. Previous prominent right hilar lymph node is obscured. Evaluation of the upper abdomen demonstrates no acute abnormality. There are no acute or suspicious osseous abnormalities. Review of the MIP images confirms the above findings. IMPRESSION: 1. No pulmonary embolus. 2. Marked progression in right pleural effusion, now moderate to large and circumferential. Consolidation in the right lower lobe with heterogeneity consistent with pneumonia, small focus of air in the periphery of the right lower lobe may reflect developing abscess. Complete consolidation throughout the right middle lobe, additional pneumonia versus compressive atelectasis. 3. Development of mediastinal adenopathy, likely reactive. Electronically Signed   By: Jeb Levering M.D.   On: 09/21/2015 21:34    Scheduled Meds: . buPROPion  200 mg Oral BID  . enoxaparin (LOVENOX) injection  50 mg Subcutaneous Q24H  . gabapentin  600 mg Oral TID  . levalbuterol  0.63 mg Nebulization Q6H  . levofloxacin  750 mg Intravenous Q24H  . levothyroxine  112 mcg Oral QAC breakfast  . methylPREDNISolone (SOLU-MEDROL) injection  40 mg Intravenous [id number]  . morphine  60 mg Oral Q12H  . piperacillin-tazobactam (ZOSYN)  IV  3.375 g Intravenous Q8H  . sertraline  100 mg Oral Daily  . vancomycin  1,000 mg Intravenous Q12H   Continuous Infusions:   Assessment and plan:  Principal Problem:   CAP (community acquired pneumonia) Active Problems:   Acute respiratory  failure with hypoxia (HCC)   Parapneumonic effusion   Hypothyroidism   OBESITY NOS   TOBACCO ABUSE   Carpal tunnel syndrome   Chest pain on respiration   Chronic pain syndrome   Hypothyroidism, adult   Depression with anxiety   1. Community-acquired pneumonia with pleurisy; large right parapneumonic effusion; possible impending empyema. On admission, CT angiogram of the patient's chest revealed no pulmonary embolus, but it did reveal a dense right lower lobe pneumonia and a small pleural effusion. She was afebrile in the ED. She was hypoxic with an oxygen saturation of 88% on room air. Blood cultures were not ordered. Her white blood cell count was 13.7 on admission. -She was given one 10 mg dose of Decadron. Patient was started Rocephin and azithromycin. Solu-Medrol was subsequently added. Her white blood cell count has increased, but this may have been from the IV steroids. -Incentive spirometry was ordered and the patient was encouraged to get out of bed to the chair more frequently. Xopenex was also started. -Due to her worsening hypoxia, CT angiogram of her chest was ordered. It revealed no pulmonary embolus, but markedly progression in the right pleural effusion-moderate to large; consolidation in the right lower lobe consistent with pneumonia with a small focus of air in the periphery of the right lower lobe which may reflect a developing abscess. -  Given the findings of the CT scan, Rocephin and azithromycin were discontinued. Antibiotic broadened with Levaquin, Zosyn, and vancomycin. -Pulmonologist, Dr. Luan Pulling was consulted. He has ordered a therapeutic and diagnostic paracentesis.  Acute respiratory failure with hypoxia. Patient's oxygen saturation was 80% on room air in the ED. Patient was started on nasal cannula oxygen. However, on 2 occasions when I entered the room, her oxygen was off. Recheck of her oxygen saturation was 81% on room air on 09/20/15. -Patient was placed back on  oxygen. She was encouraged to keep her oxygen on. -It was believed that  her chronic narcotics and benzos were contributing to her hypoxia, so MS Contin was decreased to every 12 hours; IV Dilaudid was discontinued; and when necessary diazepam was decreased to 2 mg rather than 5 mg every 8 hours.  -Small dosing of when necessary morphine was ordered. -CT angiogram of her chest and ABG were ordered for further evaluation. ABG on 09/21/15 revealed a pH of 7.4, CO2 of 46, and PO2 of 68. Results of the CT scan dictated above. -Thoracentesis pending.   Tobacco abuse. The patient was advised to stop smoking.  Chronic pain syndrome. Patient is treated chronically with MS Contin 60 mg every 8 hours, when necessary MSIR, Zanaflex, and gabapentin. Zanaflex was not restarted. In light of her hypoxia and her other psychotropic medications, it was decided that the MS Contin would be decreased to every 12 hours and Dilaudid was discontinued in favor of when necessary IV morphine. -When necessary Robaxin was added.  Chronic depression with anxiety. Patient is treated chronically with Zoloft, Wellbutrin, gabapentin.. All were continued. -When necessary diazepam and trazodone were started on admission. However, in light of her hypoxia and ill appearance, the dose of diazepam and trazodone were significantly decreased.  Hypothyroidism. The patient is treated chronically with Synthroid. It was continued. Her TSH was within normal limits.   Time spent: 35 minutes    [facility] Pager [phone]. If 7PM-7AM, please contact night-coverage at [email or web address], password [facility] 09/22/2015, 12:53 PM  LOS: 3 days

## 2015-09-22 NOTE — Progress Notes (Signed)
NURSING PROGRESS NOTE  EILZABETH NOWLEN OU:[id number] Admission Data: 09/22/2015 6:01 PM Attending Provider: Rexene Alberts, MD SX:[id number], CAMMIE, MD Code Status: FULL   [name] [name] is a 55 y.o. female patient transferred from [facility]. Report received from [facility], RN.   -No acute distress noted.  -No complaints of shortness of breath.  -No complaints of chest pain.   Cardiac Monitoring: Box # 2 in place. Cardiac monitor yields:normal sinus rhythm.  Blood pressure 115/60, pulse 85, temperature 98.4 F (36.9 C), temperature source Oral, resp. rate 20, height 5\' 6"  (1.676 m), weight 108.228 kg (238 lb 9.6 oz), SpO2 95 %.   IV Fluids:  IV in place, occlusive dsg intact without redness, IV cath forearm left, condition patent and no redness none.   Allergies:  Celebrex; Nsaids; and Percocet  Past Medical History:   has a past medical history of Asthma; COPD (chronic obstructive pulmonary disease) ([place]); Emphysema of lung ([place]); GERD (gastroesophageal reflux disease); Thyroid disease; Nerve damage; Neuromuscular disorder ([place]); Anxiety; and Carpal tunnel syndrome (01/13/2015).  Past Surgical History:   has past surgical history that includes Anterior cervical decomp/discectomy fusion and Tonsillectomy.  Social History:   reports that she quit smoking about 7 years ago. She has never used smokeless tobacco. She reports that she does not drink alcohol or use illicit drugs.  Skin: Intact  Patient/Family orientated to room. Information packet given to patient/family. Admission inpatient armband information verified with patient/family to include name and date of birth and placed on patient arm. Side rails up x 2, fall assessment and education completed with patient/family. Patient/family able to verbalize understanding of risk associated with falls and verbalized understanding to call for assistance before getting out of bed. Call light within reach. Patient/family able to voice and  demonstrate understanding of unit orientation instructions.    Will continue to evaluate and treat per MD orders.  Charolette Child, RN

## 2015-09-22 NOTE — Progress Notes (Signed)
Patient transferred to [facility] by care link.  Report called and given to [facility], RN on accepting unit.  Patient and family aware of plans, and patient in stable condition at this time.

## 2015-09-22 NOTE — Progress Notes (Signed)
Thoracentesis complete no signs of distress. 35 ml of serosanguinous pleural fluid removed.

## 2015-09-22 NOTE — Progress Notes (Signed)
NIGHT:  RN related that CT was repeated for increasing oxygen requirement.  CT showed increase parapneumonic effusion, and queried abscess. Patient is improving per RN. Ambulating.  I will broaden antibiotic and to cover anearobic. Will change Rocephin Zithromax to Van/Zosyn and Avelox or Levaquin. Consider pulmonary consultation and thoracocentesis.  Thanks.

## 2015-09-22 NOTE — Progress Notes (Signed)
ADDENDUM:  Diagnostic and therapeutic thoracentesis ordered for large right parapneumonic pleural effusion. Ultrasound-guided right thoracentesis yielded only 33 cc of pleural fluid, because the effusion was loculated and complex per my conversation with radiologist, Dr. Thornton Papas. Findings were discussed with pulmonologist Dr. Luan Pulling. He recommended transferring the patient to [facility] where cardiothoracic surgery could evaluate her for a chest tube or a VATS procedure. He discussed the patient with cardiothoracic surgeon, Dr. Roxan Hockey. He was in agreement to see and evaluate the patient in consultation.   I discussed the patient with my colleague, Dr. Conley Canal. She will be the accepting physician. She was asked to notify Dr. Roxan Hockey when the patient arrives.   All of the above was discussed with the patient and her partner. They were in agreement.

## 2015-09-22 NOTE — Progress Notes (Signed)
Notified MD on call of results of CT scan from earlier in shift.  Patient is in no respiratory distress at this time.  Patient states that she feels better and feels as if she is breathing better at this time.  Received orders from MD, will continue to monitor patient.

## 2015-09-22 NOTE — Progress Notes (Signed)
Peripherally Inserted Central Catheter/Midline Placement  The IV Nurse has discussed with the patient and/or persons authorized to consent for the patient, the purpose of this procedure and the potential benefits and risks involved with this procedure.  The benefits include less needle sticks, lab draws from the catheter and patient may be discharged home with the catheter.  Risks include, but not limited to, infection, bleeding, blood clot (thrombus formation), and puncture of an artery; nerve damage and irregular heat beat.  Alternatives to this procedure were also discussed.  PICC/Midline Placement Documentation  PICC Double Lumen [id number] PICC Right Basilic 44 cm 1 cm (Active)  Indication for Insertion or Continuance of Line Prolonged intravenous therapies 09/22/2015 10:15 PM  Exposed Catheter (cm) 1 cm 09/22/2015 10:15 PM  Site Assessment Clean;Dry;Intact 09/22/2015 10:15 PM  Lumen #1 Status Flushed;Saline locked;Blood return noted 09/22/2015 10:15 PM  Lumen #2 Status Flushed;Saline locked;Blood return noted 09/22/2015 10:15 PM  Dressing Type Transparent 09/22/2015 10:15 PM  Dressing Status Clean;Dry;Intact;Antimicrobial disc in place 09/22/2015 10:15 PM  Dressing Intervention New dressing 09/22/2015 10:15 PM  Dressing Change Due 09/29/15 09/22/2015 10:15 PM       Aldona Lento L 09/22/2015, 10:35 PM

## 2015-09-22 NOTE — Consult Note (Signed)
Consult requested by: Dr. Caryn Section Consult requested for pneumonia respiratory failure pleural effusion:  HPI: This is a 55 year old who came to the emergency department with increasing shortness of breath. She was found to have pneumonia and a pleural effusion. Her pneumonia appears to be multilobar encompassing the right middle and right lower lobes. She had more trouble with shortness of breath yesterday and had repeat CT which shows that the pleural effusion has become larger and there is some question of potential abscess. She says she feels better than yesterday.  Past Medical History  Diagnosis Date  . Asthma   . COPD (chronic obstructive pulmonary disease) ([place])   . Emphysema of lung ([place])   . GERD (gastroesophageal reflux disease)   . Thyroid disease     hypo thyroid  . Nerve damage     right leg  . Neuromuscular disorder (HCC)     fibromyalgia  . Anxiety   . Carpal tunnel syndrome 01/13/2015    Bilateral     Family History  Problem Relation Age of Onset  . Colon cancer Neg Hx   . Esophageal cancer Neg Hx   . Stomach cancer Neg Hx   . Rectal cancer Neg Hx      Social History   Social History  . Marital Status: Single    Spouse Name: N/A  . Number of Children: N/A  . Years of Education: N/A   Social History Main Topics  . Smoking status: Former Smoker    Quit date: 11/22/2007  . Smokeless tobacco: Never Used  . Alcohol Use: No  . Drug Use: No  . Sexual Activity: Not Asked   Other Topics Concern  . None   Social History Narrative     ROS: She has not had recent fever. She has had some chills. She has chest pain when she takes a deep breath. No hemoptysis. Otherwise per the history and physical    Objective: Vital signs in last 24 hours: Temp:  [98 F (36.7 C)-98.9 F (37.2 C)] 98.9 F (37.2 C) (01/09 0557) Pulse Rate:  [87-98] 87 (01/09 0557) Resp:  [20] 20 (01/09 0557) BP: (127-159)/(56-78) 127/73 mmHg (01/09 0557) SpO2:  [90 %-96 %] 95 % (01/09  0900) FiO2 (%):  [40 %] 40 % (01/09 0107) Weight change:  Last BM Date: 09/18/15  Intake/Output from previous day: 01/08 0701 - 01/09 0700 In: -  Out: 1300 [Urine:1300]  PHYSICAL EXAM She is awake and alert. She is on high flow oxygen. She looks relatively comfortable. She is still somewhat short of breath when she talks. Her pupils are reactive nose and throat are clear. Her neck is supple without masses. Her chest shows diminished breath sounds on the right with some rales on the right. Her heart is regular without gallop. Her abdomen is soft she has no edema. Central nervous system examination is grossly intact  Lab Results: Basic Metabolic Panel:  Recent Labs  09/20/15 0613  NA 138  K 3.6  CL 101  CO2 28  GLUCOSE 137*  BUN 10  CREATININE 0.70  CALCIUM 8.4*   Liver Function Tests:  Recent Labs  09/20/15 0613  AST 16  ALT 21  ALKPHOS 69  BILITOT 0.9  PROT 6.7  ALBUMIN 3.0*   No results for input(s): LIPASE, AMYLASE in the last 72 hours. No results for input(s): AMMONIA in the last 72 hours. CBC:  Recent Labs  09/20/15 0613 09/21/15 0621  WBC 21.1* 20.8*  HGB 13.2 12.5  HCT 41.3 39.1  MCV 97.4 97.0  PLT 361 338   Cardiac Enzymes:  Recent Labs  09/19/15 1051 09/19/15 1654  TROPONINI <0.03 <0.03   BNP: No results for input(s): PROBNP in the last 72 hours. D-Dimer: No results for input(s): DDIMER in the last 72 hours. CBG: No results for input(s): GLUCAP in the last 72 hours. Hemoglobin A1C: No results for input(s): HGBA1C in the last 72 hours. Fasting Lipid Panel: No results for input(s): CHOL, HDL, LDLCALC, TRIG, CHOLHDL, LDLDIRECT in the last 72 hours. Thyroid Function Tests: No results for input(s): TSH, T4TOTAL, FREET4, T3FREE, THYROIDAB in the last 72 hours. Anemia Panel: No results for input(s): VITAMINB12, FOLATE, FERRITIN, TIBC, IRON, RETICCTPCT in the last 72 hours. Coagulation: No results for input(s): LABPROT, INR in the last 72  hours. Urine Drug Screen: Drugs of Abuse  No results found for: LABOPIA, COCAINSCRNUR, LABBENZ, AMPHETMU, THCU, LABBARB  Alcohol Level: No results for input(s): ETH in the last 72 hours. Urinalysis: No results for input(s): COLORURINE, LABSPEC, PHURINE, GLUCOSEU, HGBUR, BILIRUBINUR, KETONESUR, PROTEINUR, UROBILINOGEN, NITRITE, LEUKOCYTESUR in the last 72 hours.  Invalid input(s): APPERANCEUR Misc. Labs:   ABGS:  Recent Labs  09/21/15 1600  PHART 7.413  PO2ART 67.6*  TCO2 16.4  HCO3 28.2*     MICROBIOLOGY: No results found for this or any previous visit (from the past 240 hour(s)).  Studies/Results: Ct Angio Chest Pe W/cm &/or Wo Cm  09/21/2015  CLINICAL DATA:  Persistent hypoxia. Decreased oxygen saturation with increasing oxygen requirement. EXAM: CT ANGIOGRAPHY CHEST WITH CONTRAST TECHNIQUE: Multidetector CT imaging of the chest was performed using the standard protocol during bolus administration of intravenous contrast. Multiplanar CT image reconstructions and MIPs were obtained to evaluate the vascular anatomy. CONTRAST:  129mL OMNIPAQUE IOHEXOL 350 MG/ML SOLN COMPARISON:  CT PE protocol 09/19/2015, 2 days prior FINDINGS: There are no filling defects within the pulmonary arteries to suggest pulmonary embolus. Limited assessment of the lower lobe branches secondary to breathing motion artifact and consolidation on the right. Progressive consolidation and pleural effusion in the right hemithorax. Marked increasing in right pleural effusion, now moderate to large in circumferential. Consolidation in the right lower lobe concerning for pneumonia, with heterogeneous enhancement posteriorly. Small focus of air in the periphery of the heterogeneity and may reflect developing abscess. Complete atelectasis or consolidation throughout the right middle lobe. Compressive atelectasis in the right upper lobe, with minimal ground-glass opacity in the aerated right upper lung. Scattered atelectasis  and ground-glass opacities in the left lung. No left pleural effusion. Thoracic aorta is normal in caliber. There is no pericardial effusion. Development of mediastinal adenopathy in the interim is likely reactive. Previous prominent right hilar lymph node is obscured. Evaluation of the upper abdomen demonstrates no acute abnormality. There are no acute or suspicious osseous abnormalities. Review of the MIP images confirms the above findings. IMPRESSION: 1. No pulmonary embolus. 2. Marked progression in right pleural effusion, now moderate to large and circumferential. Consolidation in the right lower lobe with heterogeneity consistent with pneumonia, small focus of air in the periphery of the right lower lobe may reflect developing abscess. Complete consolidation throughout the right middle lobe, additional pneumonia versus compressive atelectasis. 3. Development of mediastinal adenopathy, likely reactive. Electronically Signed   By: Jeb Levering M.D.   On: 09/21/2015 21:34    Medications:  Prior to Admission:  Prescriptions prior to admission  Medication Sig Dispense Refill Last Dose  . buPROPion (WELLBUTRIN SR) 200 MG 12 hr tablet Take  200 mg by mouth 2 (two) times daily.   09/19/2015 at Unknown time  . gabapentin (NEURONTIN) 600 MG tablet Take 600 mg by mouth 3 (three) times daily.   09/19/2015 at Unknown time  . levothyroxine (SYNTHROID, LEVOTHROID) 112 MCG tablet Take 112 mcg by mouth daily before breakfast.   09/19/2015 at Unknown time  . morphine (MS CONTIN) 60 MG 12 hr tablet Take 60 mg by mouth 3 (three) times daily.   09/19/2015 at Unknown time  . morphine (MSIR) 15 MG tablet Take 15 mg by mouth every 4 (four) hours as needed for severe pain.   09/18/2015 at Unknown time  . sertraline (ZOLOFT) 100 MG tablet Take 100 mg by mouth daily.   09/19/2015 at Unknown time  . tizanidine (ZANAFLEX) 2 MG capsule Take 2 mg by mouth 3 (three) times daily.   09/18/2015 at Unknown time   Scheduled: . buPROPion  200  mg Oral BID  . enoxaparin (LOVENOX) injection  50 mg Subcutaneous Q24H  . gabapentin  600 mg Oral TID  . levalbuterol  0.63 mg Nebulization Q6H  . levofloxacin  750 mg Intravenous Q24H  . levothyroxine  112 mcg Oral QAC breakfast  . methylPREDNISolone (SOLU-MEDROL) injection  40 mg Intravenous [id number]  . morphine  60 mg Oral Q12H  . piperacillin-tazobactam (ZOSYN)  IV  3.375 g Intravenous Q8H  . sertraline  100 mg Oral Daily  . vancomycin  1,000 mg Intravenous Q12H   Continuous:  [id number], methocarbamol, morphine injection, ondansetron **OR** ondansetron (ZOFRAN) IV, traZODone  Assesment: She has acute respiratory failure with hypoxia due to community-acquired pneumonia and pleurisy. She has a enlarging pleural effusion. This is probably parapneumonic but empyema needs to be ruled out.  Her pneumonia is multilobar and that's one of the reasons she is having a slow recovery. Her antibiotics were broadened early this morning which I think is appropriate considering her clinical deterioration yesterday Principal Problem:   CAP (community acquired pneumonia) Active Problems:   Hypothyroidism   OBESITY NOS   TOBACCO ABUSE   Carpal tunnel syndrome   Chest pain on respiration   Chronic pain syndrome   Hypothyroidism, adult   Acute respiratory failure with hypoxia (HCC)   Depression with anxiety    Plan: Diagnostic/therapeutic thoracentesis. Add incentive spirometry. Continue current treatments    LOS: 3 days   Faizon Capozzi L 09/22/2015, 9:05 AM

## 2015-09-22 NOTE — Clinical Social Work Note (Signed)
CSW assisted pt in completing HCPOA. Original and two copies given to pt and copy placed on chart.   [name], [place]

## 2015-09-22 NOTE — Progress Notes (Addendum)
[place] for vancomycin, zosyn Indication: pneumonia  Allergies  Allergen Reactions  . Celebrex [Celecoxib] Other (See Comments)    Severe epigastric pain  . Nsaids Other (See Comments)    SEVERE EPIGASTRIC PAIN  . Percocet [Oxycodone-Acetaminophen] Itching    Patient Measurements: Height: 5\' 6"  (167.6 cm) Weight: 238 lb 9.6 oz (108.228 kg) IBW/kg (Calculated) : 59.3   Vital Signs: Temp: 98.1 F (36.7 C) (01/08 2051) Temp Source: Oral (01/08 2051) BP: 159/56 mmHg (01/08 2051) Pulse Rate: 91 (01/08 2051)  Labs:  Recent Labs  09/19/15 0200 09/20/15 0613 09/21/15 0621  WBC 13.7* 21.1* 20.8*  HGB 14.3 13.2 12.5  PLT 355 361 338  CREATININE 0.77 0.70  --     Estimated Creatinine Clearance: 100.1 mL/min (by C-G formula based on Cr of 0.7).  No results for input(s): VANCOTROUGH, VANCOPEAK, VANCORANDOM, GENTTROUGH, GENTPEAK, GENTRANDOM, TOBRATROUGH, TOBRAPEAK, TOBRARND, AMIKACINPEAK, AMIKACINTROU, AMIKACIN in the last 72 hours.   Microbiology: No results found for this or any previous visit (from the past 720 hour(s)).  Medical History: Past Medical History  Diagnosis Date  . Asthma   . COPD (chronic obstructive pulmonary disease) ([place])   . Emphysema of lung ([place])   . GERD (gastroesophageal reflux disease)   . Thyroid disease     hypo thyroid  . Nerve damage     right leg  . Neuromuscular disorder (HCC)     fibromyalgia  . Anxiety   . Carpal tunnel syndrome 01/13/2015    Bilateral    Medications:  Scheduled:  . buPROPion  200 mg Oral BID  . enoxaparin (LOVENOX) injection  50 mg Subcutaneous Q24H  . gabapentin  600 mg Oral TID  . levalbuterol  0.63 mg Nebulization Q6H  . levofloxacin  750 mg Intravenous Q24H  . levothyroxine  112 mcg Oral QAC breakfast  . methylPREDNISolone (SOLU-MEDROL) injection  40 mg Intravenous BH-q8a2phs  . morphine  60 mg Oral Q12H  . piperacillin-tazobactam (ZOSYN)  IV  3.375 g Intravenous Q8H   . sertraline  100 mg Oral Daily  . vancomycin  1,000 mg Intravenous Q1 Hr x 2    Assessment: 55 yo female admitted 09/19/15 for CAP, likely pleurisy, and hypoxia.  Tonight, CT repeated for increasing O2 requirement showed increase parapneumonic effusion, and abscess. Abx changed to vanc/zosyn/levaquin.   Goal of Therapy:  Vancomycin trough level 15-20 mcg/ml  Plan:  Preliminary review of pertinent patient information completed.  Protocol will be initiated with a one-time dose(s) of vancomycin 1 gram x 2 and zosyn 3.375 grams IV Q 8 hours.  [name] [name] clinical pharmacist will complete review during morning rounds to assess patient and finalize treatment regimen.  [name], [name] Scarlett, RPH 09/22/2015,1:33 AM  Addum:  Will continue vancomycin 1gm IV q12 hours.  F/urenal function, cultures and clinical course [name] [name],PharmD

## 2015-09-23 DIAGNOSIS — J189 Pneumonia, unspecified organism: Secondary | ICD-10-CM | POA: Diagnosis not present

## 2015-09-23 DIAGNOSIS — J9601 Acute respiratory failure with hypoxia: Secondary | ICD-10-CM | POA: Diagnosis not present

## 2015-09-23 DIAGNOSIS — J869 Pyothorax without fistula: Secondary | ICD-10-CM | POA: Diagnosis not present

## 2015-09-23 LAB — BASIC METABOLIC PANEL
Anion gap: 11 (ref 5–15)
BUN: 11 mg/dL (ref 6–20)
CO2: 30 mmol/L (ref 22–32)
Calcium: 8.7 mg/dL — ABNORMAL LOW (ref 8.9–10.3)
Chloride: 101 mmol/L (ref 101–111)
Creatinine, Ser: 0.79 mg/dL (ref 0.44–1.00)
GFR calc Af Amer: >60 mL/min (ref >60)
GFR calc non Af Amer: >60 mL/min (ref >60)
Glucose, Bld: 209 mg/dL — ABNORMAL HIGH (ref 65–99)
Potassium: 3.3 mmol/L — ABNORMAL LOW (ref 3.5–5.1)
Sodium: 142 mmol/L (ref 135–145)

## 2015-09-23 LAB — CBC
HCT: 37.8 % (ref 36.0–46.0)
Hemoglobin: 11.8 g/dL — ABNORMAL LOW (ref 12.0–15.0)
MCH: 30.3 pg (ref 26.0–34.0)
MCHC: 31.2 g/dL (ref 30.0–36.0)
MCV: 96.9 fL (ref 78.0–100.0)
Platelets: 296 10*3/uL (ref 150–400)
RBC: 3.9 MIL/uL (ref 3.87–5.11)
RDW: 12.5 % (ref 11.5–15.5)
WBC: 11.1 10*3/uL — ABNORMAL HIGH (ref 4.0–10.5)

## 2015-09-23 LAB — TYPE AND SCREEN
ABO/RH(D): O POS
Antibody Screen: NEGATIVE

## 2015-09-23 LAB — ABO/RH: ABO/RH(D): O POS

## 2015-09-23 MED ORDER — METHYLPREDNISOLONE SODIUM SUCC 40 MG IJ SOLR
40.0000 mg | Freq: Two times a day (BID) | INTRAMUSCULAR | Status: DC
  Administered 2015-09-23 – 2015-09-25 (×4): 40 mg via INTRAVENOUS
  Filled 2015-09-23 (×4): qty 1

## 2015-09-23 NOTE — Care Management Important Message (Signed)
Important Message  Patient Details  Name: CAMYA CERUTTI MRN: [id number] Date of Birth: [birth year]   Medicare Important Message Given:  Yes    Kaydon Husby [place]ena 09/23/2015, 4:10 PM

## 2015-09-23 NOTE — Progress Notes (Signed)
[facility] PROGRESS NOTE  [name] [name] [id number] DOB: 1961-06-19 DOA: 09/19/2015 PCP: Antony Blackbird, MD Brief summary: 55 y.o. female with hx of chronic low back pain on chronic narcotics, COPD, asthma, hypothryrodismm GERD, anxiety, but no known CAD, presented to the ER with bilateral chest pain with breathing. She was fouond to have CAP and right sided effusion.  She was initially admitted to [facility] and later on transferred to [facility] for evaluation by a cardio thoracic surgeon.   Assessment/Plan: 1. Acute respiratory failure with hypoxia probably from CAP and large right para pneumonic effusion; She was initially admitted to [facility] and transferred to [facility] for possible CTVS consultation for VATS and bronchoscopy in am.  She was started on broad spectrum antibiotics and has been on oxygen to keep sats greater than 90%. CT angiogram did not reveal any pulmonary embolus. She underwent US guided right sided thoracocentesis which yeilded 33 cc and the effusion was loculated and she was transferred to [facility] for further evaluation by a cardiothoracic surgeon. Pleural fluid analysis showed a wbc count of 7830 and neutrophil count of 36. Gram stain and cultures are pending.  She was seen by Dr Roxan Hockey today and plan for bronchoscopy, right sided VATS, drainage of empyema and decortication in OR tomorrow.  Wound recommend to continue IV antibiotics, bronchodilators, scheduled for the 24 hours. Start tapering steroids.     Chronic pain syndrome; Pain control with MS CONTIN and prn dilaudid for breakthrough pain.    Hypothyroidism: Resume synthroid.    Leukocytosis: Possibly a combination of steroids and infection.      Code Status: full code.  Family Communication: none at bedside Disposition Plan: pending further investigation.    Consultants:  Cardiothoracic surgery AT [facility].   Pulmonology at [place].   Procedures:  Thoracocentesis on 1/0  Antibiotics:  Zosyn 09/22/15>>  Vancomycin  09/22/15>>  Levaquin 09/22/15>>1/10   Azithromycin 09/19/15>> 09/22/15  Rocephin 09/19/15>> 09/22/15  HPI/Subjective: Feeling the same.   Objective: Filed Vitals:   09/23/15 0541 09/23/15 1452  BP: 136/84 121/67  Pulse: 78 80  Temp: 98.1 F (36.7 C) 97.8 F (36.6 C)  Resp: 20 20    Intake/Output Summary (Last 24 hours) at 09/23/15 1527 Last data filed at 09/23/15 0900  Gross per 24 hour  Intake   1020 ml  Output      0 ml  Net   1020 ml   Filed Weights   09/19/15 0106 09/19/15 0638  Weight: 108.863 kg (240 lb) 108.228 kg (238 lb 9.6 oz)    Exam:   General:  Alert afebrile on venti mask.   Cardiovascular: s1s2  Respiratory: diminished on the right side, no wheezing, air entry fair  Abdomen: soft nont ender non distended bowel sounds are heard.   Musculoskeletal:trace  pedal edema.   Data Reviewed: Basic Metabolic Panel:  Recent Labs Lab 09/19/15 0200 09/20/15 0613  NA 142 138  K 4.1 3.6  CL 102 101  CO2 30 28  GLUCOSE 131* 137*  BUN 13 10  CREATININE 0.77 0.70  CALCIUM 9.3 8.4*   Liver Function Tests:  Recent Labs Lab 09/20/15 0613  AST 16  ALT 21  ALKPHOS 69  BILITOT 0.9  PROT 6.7  ALBUMIN 3.0*   No results for input(s): LIPASE, AMYLASE in the last 168 hours. No results for input(s): AMMONIA in the last 168 hours. CBC:  Recent Labs Lab 09/19/15 0200 09/20/15 0613 09/21/15 0621  WBC 13.7* 21.1* 20.8*  NEUTROABS 11.9*  --   --  HGB 14.3 13.2 12.5  HCT 43.1 41.3 39.1  MCV 95.1 97.4 97.0  PLT 355 361 338   Cardiac Enzymes:  Recent Labs Lab 09/19/15 0521 09/19/15 1051 09/19/15 1654  TROPONINI <0.03 <0.03 <0.03   BNP (last 3 results) No results for input(s): BNP in the last 8760 hours.  ProBNP (last 3 results) No results for input(s): PROBNP in the last 8760 hours.  CBG: No results for input(s): GLUCAP in the last 168 hours.  Recent Results (from the past 240 hour(s))  Culture, body fluid-bottle     Status: None (Preliminary  result)   Collection Time: 09/22/15  2:21 PM  Result Value Ref Range Status   Specimen Description FLUID PLEURAL  Final   Special Requests BOTTLES DRAWN AEROBIC AND ANAEROBIC [facility]  Final   Culture PENDING  Incomplete   Report Status PENDING  Incomplete  Gram stain     Status: None (Preliminary result)   Collection Time: 09/22/15  2:25 PM  Result Value Ref Range Status   Specimen Description FLUID  Final   Special Requests NONE  Final   Gram Stain NO ORGANISMS SEEN PERFORMED AT [facility]   Final   Report Status PENDING  Incomplete     Studies: Dg Chest 1 View  09/22/2015  CLINICAL DATA:  Loculated RIGHT pleural effusion post thoracentesis EXAM: CHEST 1 VIEW COMPARISON:  CT chest 09/21/2015, chest radiograph 09/19/2015 FINDINGS: Enlargement of cardiac silhouette with pulmonary vascular congestion. Diffuse infiltrates question pulmonary edema. Moderate to large RIGHT pleural effusion, partially loculated by prior CT. No apical pneumothorax identified. Persistent atelectasis of RIGHT middle and RIGHT lower lobes as well as portion of RIGHT upper lobe. Minimal LEFT basilar atelectasis. Several foci of gas at the mid RIGHT lung and at the RIGHT base are identified, question aerated lung versus potentially small foci of loculated pneumothorax post thoracentesis. Minimal degenerative disc disease changes thoracic spine. IMPRESSION: Pulmonary edema. Persistent moderate to large loculated RIGHT pleural effusion with extensive atelectasis of the mid to lower RIGHT lung. Several foci of gas noted in the RIGHT mid lung and at the lower RIGHT chest question foci of aerated lung versus small foci of loculated pneumothorax post RIGHT thoracentesis. Findings called to Dr. Luan Pulling on 09/22/2015 at 1430 hours. Electronically Signed   By: Lavonia Dana M.D.   On: 09/22/2015 14:33   Ct Angio Chest Pe W/cm &/or Wo Cm  09/21/2015  CLINICAL DATA:  Persistent hypoxia. Decreased oxygen saturation with increasing oxygen  requirement. EXAM: CT ANGIOGRAPHY CHEST WITH CONTRAST TECHNIQUE: Multidetector CT imaging of the chest was performed using the standard protocol during bolus administration of intravenous contrast. Multiplanar CT image reconstructions and MIPs were obtained to evaluate the vascular anatomy. CONTRAST:  167mL OMNIPAQUE IOHEXOL 350 MG/ML SOLN COMPARISON:  CT PE protocol 09/19/2015, 2 days prior FINDINGS: There are no filling defects within the pulmonary arteries to suggest pulmonary embolus. Limited assessment of the lower lobe branches secondary to breathing motion artifact and consolidation on the right. Progressive consolidation and pleural effusion in the right hemithorax. Marked increasing in right pleural effusion, now moderate to large in circumferential. Consolidation in the right lower lobe concerning for pneumonia, with heterogeneous enhancement posteriorly. Small focus of air in the periphery of the heterogeneity and may reflect developing abscess. Complete atelectasis or consolidation throughout the right middle lobe. Compressive atelectasis in the right upper lobe, with minimal ground-glass opacity in the aerated right upper lung. Scattered atelectasis and ground-glass opacities in the left lung. No left pleural  effusion. Thoracic aorta is normal in caliber. There is no pericardial effusion. Development of mediastinal adenopathy in the interim is likely reactive. Previous prominent right hilar lymph node is obscured. Evaluation of the upper abdomen demonstrates no acute abnormality. There are no acute or suspicious osseous abnormalities. Review of the MIP images confirms the above findings. IMPRESSION: 1. No pulmonary embolus. 2. Marked progression in right pleural effusion, now moderate to large and circumferential. Consolidation in the right lower lobe with heterogeneity consistent with pneumonia, small focus of air in the periphery of the right lower lobe may reflect developing abscess. Complete  consolidation throughout the right middle lobe, additional pneumonia versus compressive atelectasis. 3. Development of mediastinal adenopathy, likely reactive. Electronically Signed   By: Jeb Levering M.D.   On: 09/21/2015 21:34   US Thoracentesis Asp Pleural Space W/img Guide  09/22/2015  CLINICAL DATA:  RIGHT pleural effusion EXAM: ULTRASOUND GUIDED DIAGNOSTIC AND THERAPEUTIC THORACENTESIS COMPARISON:  CT chest 09/21/2015 PROCEDURE: Procedure, benefits, and risks of procedure were discussed with patient. Written informed consent for procedure was obtained. Time out protocol followed. Pleural effusion localized by ultrasound at the posterior RIGHT hemithorax. Pleural effusion is complex, containing multiple scattered areas of internal echogenicity with multiple loculations/septations. Skin prepped and draped in usual sterile fashion. Skin and soft tissues anesthetized with 10 mL of 1% lidocaine. 8 French thoracentesis catheter placed into the RIGHT pleural space. 33 mL of serosanguineous fluid aspirated by syringe pump. Procedure tolerated well by patient without immediate complication. COMPLICATIONS: No immediate complications ; post procedural chest radiograph reported separately. FINDINGS: A total of approximately 33 mL of RIGHT pleural fluid was removed. Entire specimen was sent for requested laboratory analysis. IMPRESSION: Successful ultrasound guided RIGHT thoracentesis yielding 33 mL of pleural fluid. Sonographically the RIGHT pleural effusion appears complex and multiloculated. Findings called to Dr.  Luan Pulling on 09/22/2015 at 1430 hours. Electronically Signed   By: Lavonia Dana M.D.   On: 09/22/2015 14:38    Scheduled Meds: . buPROPion  200 mg Oral BID  . enoxaparin (LOVENOX) injection  50 mg Subcutaneous Q24H  . gabapentin  600 mg Oral TID  . levalbuterol  0.63 mg Nebulization Q6H  . levothyroxine  112 mcg Oral QAC breakfast  . methylPREDNISolone (SOLU-MEDROL) injection  40 mg Intravenous  BH-q8a2phs  . morphine  60 mg Oral Q12H  . piperacillin-tazobactam (ZOSYN)  IV  3.375 g Intravenous Q8H  . senna-docusate  2 tablet Oral QHS  . sertraline  100 mg Oral Daily  . vancomycin  1,000 mg Intravenous Q12H   Continuous Infusions:   Principal Problem:   CAP (community acquired pneumonia) Active Problems:   Hypothyroidism   OBESITY NOS   TOBACCO ABUSE   Carpal tunnel syndrome   Chest pain on respiration   Chronic pain syndrome   Hypothyroidism, adult   Acute respiratory failure with hypoxia (HCC)   Depression with anxiety   Parapneumonic effusion   Empyema, right ([place])    Time spent: 25 minutes.     [facility]  [facility] Pager [phone]  If 7PM-7AM, please contact night-coverage at [email or web address], password [facility] 09/23/2015, 3:27 PM  LOS: 4 days

## 2015-09-23 NOTE — Consult Note (Signed)
Reason for Consult:Right empyema Referring Physician: Dr. Luan Pulling [facility]  [name] [name] is an 55 y.o. female.  HPI: 55 yo woman with a history of chronic back pain, asthmas/ COPD, hypothyroidism and GERD. She was in her usual state of health until last Friday. She began having more difficulty breathing with cough, SOB and wheezing. She went to her MD and was given antibiotics and steroids. Despite that she then developed bilateral chest pain, which progressed to severe right sided pleuritic CP. She went to the ED. Her D-dimer was elevated. A CT chest was done to rule out PE. It showed a dense RLL pneumonia and bilateral effusion. Her WBC was elevated. She was admitted and started on IV antibiotics. She ruled out for MI.  By 1/8 she was having more pain and SOB. A repeat CT showed a large loculated right effusion. An US guided thoracentesis was done. Only ~ 30 ml of fluid could be withdrawn. It was c/w an empyema. She was transferred to [facility] for further management.  She currently has moderate pleuritic pain, improved from 2 days ago. She still has increased WOB. She is currently on vanco and zosyn.  Zubrod Score: At the time of surgery this patient's most appropriate activity status/level should be described as: [x]     0    Normal activity, no symptoms []     1    Restricted in physical strenuous activity but ambulatory, able to do out light work []     2    Ambulatory and capable of self care, unable to do work activities, up and about >50 % of waking hours                              []     3    Only limited self care, in bed greater than 50% of waking hours []     4    Completely disabled, no self care, confined to bed or chair []     5    Moribund   Past Medical History  Diagnosis Date  . Asthma   . COPD (chronic obstructive pulmonary disease) ([place])   . Emphysema of lung ([place])   . GERD (gastroesophageal reflux disease)   . Thyroid disease     hypo thyroid  . Nerve damage      right leg  . Neuromuscular disorder ([facility])     fibromyalgia  . Anxiety   . Carpal tunnel syndrome 01/13/2015    Bilateral    Past Surgical History  Procedure Laterality Date  . Anterior cervical decomp/discectomy fusion      L4, L5   . Tonsillectomy      Family History  Problem Relation Age of Onset  . Colon cancer Neg Hx   . Esophageal cancer Neg Hx   . Stomach cancer Neg Hx   . Rectal cancer Neg Hx     Social History:  reports that she quit smoking about 7 years ago. She has never used smokeless tobacco. She reports that she does not drink alcohol or use illicit drugs.  Allergies:  Allergies  Allergen Reactions  . Celebrex [Celecoxib] Other (See Comments)    Severe epigastric pain  . Nsaids Other (See Comments)    SEVERE EPIGASTRIC PAIN  . Percocet [Oxycodone-Acetaminophen] Itching    Medications:  Scheduled: . buPROPion  200 mg Oral BID  . enoxaparin (LOVENOX) injection  50 mg Subcutaneous Q24H  . gabapentin  600  mg Oral TID  . levalbuterol  0.63 mg Nebulization Q6H  . levofloxacin  750 mg Intravenous Q24H  . levothyroxine  112 mcg Oral QAC breakfast  . methylPREDNISolone (SOLU-MEDROL) injection  40 mg Intravenous [id number]  . morphine  60 mg Oral Q12H  . piperacillin-tazobactam (ZOSYN)  IV  3.375 g Intravenous Q8H  . senna-docusate  2 tablet Oral QHS  . sertraline  100 mg Oral Daily  . vancomycin  1,000 mg Intravenous Q12H    Results for orders placed or performed during the hospital encounter of 09/19/15 (from the past 48 hour(s))  Blood gas, arterial     Status: Abnormal   Collection Time: 09/21/15  4:00 PM  Result Value Ref Range   O2 Content 6.0 L/min   Delivery systems NASAL CANNULA    pH, Arterial 7.413 7.350 - 7.450   pCO2 arterial 46.4 (H) 35.0 - 45.0 mmHg   pO2, Arterial 67.6 (L) 80.0 - 100.0 mmHg   Bicarbonate 28.2 (H) 20.0 - 24.0 mEq/L   TCO2 16.4 0 - 100 mmol/L   Acid-Base Excess 4.7 (H) 0.0 - 2.0 mmol/L   O2 Saturation 94.7 %    Patient temperature 37.0    Collection site RIGHT RADIAL    Drawn by [id number]    Sample type ARTERIAL    Allens test (pass/fail) PASS PASS  Lactate dehydrogenase (CSF, pleural or peritoneal fluid)     Status: Abnormal   Collection Time: 09/22/15  2:00 PM  Result Value Ref Range   LD, Fluid 3339 (H) 3 - 23 U/L    Comment: RESULTS CONFIRMED BY MANUAL DILUTION (NOTE) Results should be evaluated in conjunction with serum values    Fluid Type-FLDH Pleural Fld   Body fluid cell count with differential     Status: Abnormal   Collection Time: 09/22/15  2:00 PM  Result Value Ref Range   Fluid Type-FCT Body Fluid    Color, Fluid YELLOW YELLOW   Appearance, Fluid CLOUDY (A) CLEAR   WBC, Fluid 7830 (H) 0 - 1000 cu mm   Neutrophil Count, Fluid 36 (H) 0 - 25 %   Lymphs, Fluid 11 %   Monocyte-Macrophage-Serous Fluid 53 50 - 90 %   Eos, Fluid 0 %   Other Cells, Fluid RARE %    Comment: OTHER CELLS IDENTIFIED AS MESOTHELIAL CELLS REACTIVE CELLS PRESENT OTHER CELLS UNIDENTIFIED; SEE CYTOLOGY REPORT   Glucose, pleural or peritoneal fluid     Status: None   Collection Time: 09/22/15  2:00 PM  Result Value Ref Range   Glucose, Fluid <20 mg/dL    Comment: (NOTE) No normal range established for this test Results should be evaluated in conjunction with serum values    Fluid Type-FGLU Pleural Fld   Protein, fluid - pleural or peritoneal     Status: None   Collection Time: 09/22/15  2:08 PM  Result Value Ref Range   Total protein, fluid 5.2 g/dL    Comment: (NOTE) No normal range established for this test Results should be evaluated in conjunction with serum values    Fluid Type-FTP Pleural Fld   Culture, body fluid-bottle     Status: None (Preliminary result)   Collection Time: 09/22/15  2:21 PM  Result Value Ref Range   Specimen Description FLUID PLEURAL    Special Requests BOTTLES DRAWN AEROBIC AND ANAEROBIC 6CC    Culture PENDING    Report Status PENDING   Gram stain     Status: None  (Preliminary result)  Collection Time: 09/22/15  2:25 PM  Result Value Ref Range   Specimen Description FLUID    Special Requests NONE    Gram Stain NO ORGANISMS SEEN PERFORMED AT [facility]     Report Status PENDING     Dg Chest 1 View  09/22/2015  CLINICAL DATA:  Loculated RIGHT pleural effusion post thoracentesis EXAM: CHEST 1 VIEW COMPARISON:  CT chest 09/21/2015, chest radiograph 09/19/2015 FINDINGS: Enlargement of cardiac silhouette with pulmonary vascular congestion. Diffuse infiltrates question pulmonary edema. Moderate to large RIGHT pleural effusion, partially loculated by prior CT. No apical pneumothorax identified. Persistent atelectasis of RIGHT middle and RIGHT lower lobes as well as portion of RIGHT upper lobe. Minimal LEFT basilar atelectasis. Several foci of gas at the mid RIGHT lung and at the RIGHT base are identified, question aerated lung versus potentially small foci of loculated pneumothorax post thoracentesis. Minimal degenerative disc disease changes thoracic spine. IMPRESSION: Pulmonary edema. Persistent moderate to large loculated RIGHT pleural effusion with extensive atelectasis of the mid to lower RIGHT lung. Several foci of gas noted in the RIGHT mid lung and at the lower RIGHT chest question foci of aerated lung versus small foci of loculated pneumothorax post RIGHT thoracentesis. Findings called to Dr. Luan Pulling on 09/22/2015 at 1430 hours. Electronically Signed   By: Lavonia Dana M.D.   On: 09/22/2015 14:33   Ct Angio Chest Pe W/cm &/or Wo Cm  09/21/2015  CLINICAL DATA:  Persistent hypoxia. Decreased oxygen saturation with increasing oxygen requirement. EXAM: CT ANGIOGRAPHY CHEST WITH CONTRAST TECHNIQUE: Multidetector CT imaging of the chest was performed using the standard protocol during bolus administration of intravenous contrast. Multiplanar CT image reconstructions and MIPs were obtained to evaluate the vascular anatomy. CONTRAST:  116mL OMNIPAQUE IOHEXOL 350 MG/ML SOLN  COMPARISON:  CT PE protocol 09/19/2015, 2 days prior FINDINGS: There are no filling defects within the pulmonary arteries to suggest pulmonary embolus. Limited assessment of the lower lobe branches secondary to breathing motion artifact and consolidation on the right. Progressive consolidation and pleural effusion in the right hemithorax. Marked increasing in right pleural effusion, now moderate to large in circumferential. Consolidation in the right lower lobe concerning for pneumonia, with heterogeneous enhancement posteriorly. Small focus of air in the periphery of the heterogeneity and may reflect developing abscess. Complete atelectasis or consolidation throughout the right middle lobe. Compressive atelectasis in the right upper lobe, with minimal ground-glass opacity in the aerated right upper lung. Scattered atelectasis and ground-glass opacities in the left lung. No left pleural effusion. Thoracic aorta is normal in caliber. There is no pericardial effusion. Development of mediastinal adenopathy in the interim is likely reactive. Previous prominent right hilar lymph node is obscured. Evaluation of the upper abdomen demonstrates no acute abnormality. There are no acute or suspicious osseous abnormalities. Review of the MIP images confirms the above findings. IMPRESSION: 1. No pulmonary embolus. 2. Marked progression in right pleural effusion, now moderate to large and circumferential. Consolidation in the right lower lobe with heterogeneity consistent with pneumonia, small focus of air in the periphery of the right lower lobe may reflect developing abscess. Complete consolidation throughout the right middle lobe, additional pneumonia versus compressive atelectasis. 3. Development of mediastinal adenopathy, likely reactive. Electronically Signed   By: Jeb Levering M.D.   On: 09/21/2015 21:34   US Thoracentesis Asp Pleural Space W/img Guide  09/22/2015  CLINICAL DATA:  RIGHT pleural effusion EXAM:  ULTRASOUND GUIDED DIAGNOSTIC AND THERAPEUTIC THORACENTESIS COMPARISON:  CT chest 09/21/2015 PROCEDURE: Procedure, benefits, and  risks of procedure were discussed with patient. Written informed consent for procedure was obtained. Time out protocol followed. Pleural effusion localized by ultrasound at the posterior RIGHT hemithorax. Pleural effusion is complex, containing multiple scattered areas of internal echogenicity with multiple loculations/septations. Skin prepped and draped in usual sterile fashion. Skin and soft tissues anesthetized with 10 mL of 1% lidocaine. 8 French thoracentesis catheter placed into the RIGHT pleural space. 33 mL of serosanguineous fluid aspirated by syringe pump. Procedure tolerated well by patient without immediate complication. COMPLICATIONS: No immediate complications ; post procedural chest radiograph reported separately. FINDINGS: A total of approximately 33 mL of RIGHT pleural fluid was removed. Entire specimen was sent for requested laboratory analysis. IMPRESSION: Successful ultrasound guided RIGHT thoracentesis yielding 33 mL of pleural fluid. Sonographically the RIGHT pleural effusion appears complex and multiloculated. Findings called to Dr.  Luan Pulling on 09/22/2015 at 1430 hours. Electronically Signed   By: Lavonia Dana M.D.   On: 09/22/2015 14:38    Review of Systems  Constitutional: Positive for fever and malaise/fatigue. Negative for chills.  Eyes: Negative for blurred vision and double vision.  Respiratory: Positive for cough, shortness of breath and wheezing. Negative for sputum production.   Cardiovascular: Positive for chest pain (right sided pleuritic).  Gastrointestinal: Negative for nausea and vomiting.  Genitourinary: Negative.   Musculoskeletal: Positive for back pain and neck pain.  Neurological: Negative for seizures and loss of consciousness.  All other systems reviewed and are negative.  Blood pressure 136/84, pulse 78, temperature 98.1 F (36.7  C), temperature source Oral, resp. rate 20, height 5\' 6"  (1.676 m), weight 238 lb 9.6 oz (108.228 kg), SpO2 97 %. Physical Exam  Vitals reviewed. Constitutional: She is oriented to person, place, and time. No distress.  obese  HENT:  Head: Normocephalic and atraumatic.  Eyes: Conjunctivae and EOM are normal. No scleral icterus.  Neck: Neck supple. No thyromegaly present.  Respiratory: She has no wheezes. She has no rales.  Markedly diminished BS on right  GI: Soft. She exhibits no distension. There is no tenderness.  Musculoskeletal: She exhibits no edema.  Lymphadenopathy:    She has no cervical adenopathy.  Neurological: She is alert and oriented to person, place, and time. No cranial nerve deficit.  No focal deficit  Skin: Skin is warm and dry.   I personally reviewed all pertinent radiologic studies and concur with the findings as noted above.  Assessment/Plan: 55 yo woman with a right lower lobe pneumonia and right empyema. Right VATS, drainage of empyema and decortication is indicated to restore lung function and allow antibiotics to treat the infection. I will also plan to do a bronchoscopy at the time of surgery to r/o any endobronchial issue that could have led to the pneumonia.  I have discussed the general nature of the procedure, the need for general anesthesia, and the incisions to be used with [name] [name] in person and her partner, [name] [name], by telephone. We discussed the expected hospital stay, overall recovery and short and long term outcomes. I reviewed the indications, risks, benefits and alternatives. They understand the risks include, but are not limited to death, stroke, MI, DVT/PE, bleeding, possible need for transfusion, infection, prolonged air leak, cardiac arrhythmias, as well as other unforeseeable complications. She accepts the risks and agrees to proceed.  Plan Bronchoscopy, Right VATS, drainage of empyema, decortication in OR tomorrow AM  Melrose Nakayama 09/23/2015, 9:26 AM

## 2015-09-23 NOTE — Care Management Note (Signed)
Case Management Note  Patient Details  Name: [name] [name] MRN: [id number] Date of Birth: [birth year]  Subjective/Objective:                  Date- 09-23-15 Initial Assessment Patient transferred from [facility] with patient at the bedside.  Introduced self as Tourist information centre manager and explained role in discharge planning and how to be reached.  Verified patient lives in [place] with partner.  Verified patient anticipates to go home with partner at time of discharge.  Patient has DME cane nebulizer. Expressed potential need for no other DME.  Patient denied  needing help with their medication.  Patient drives to MD appointments.  Verified patient has PCP [facility]. Patient states they currently receive [facility] services through no one.  Patient was provided choice and selected [facility] for home health needs if they arise.  Plan: CM will continue to follow for discharge planning and [facility] resources.   Carles Collet RN BSN CM [phone]   Action/Plan:  Patient states she is getting a Pleurex drain. Patient will need HH RN, chose [facility]. Pleurex papers need  to be faxed once drain is in, patient needs to be provided first box of drains to take home, and [facility] referral to be made. Awaiting placement of drain for papers to fax and referral.   Expected Discharge Date:                  Expected Discharge Plan:  [place]  In-House Referral:     Discharge planning Services  CM Consult  Post Acute Care Choice:  Home Health Choice offered to:  Patient  DME Arranged:    DME Agency:     HH Arranged:    [place] Agency:     Status of Service:  In process, will continue to follow  Medicare Important Message Given:    Date Medicare IM Given:    Medicare IM give by:    Date Additional Medicare IM Given:    Additional Medicare Important Message give by:     If discussed at [place] of Stay Meetings, dates discussed:    Additional Comments:  Carles Collet, RN 09/23/2015,  12:03 PM

## 2015-09-24 ENCOUNTER — Inpatient Hospital Stay (HOSPITAL_COMMUNITY): Payer: PPO

## 2015-09-24 ENCOUNTER — Encounter (HOSPITAL_COMMUNITY)
Admission: EM | Disposition: A | Discharge status: Home / Self Care | Payer: Self-pay | Source: Home / Self Care | Attending: Internal Medicine

## 2015-09-24 ENCOUNTER — Inpatient Hospital Stay (HOSPITAL_COMMUNITY): Payer: PPO | Admitting: Certified Registered Nurse Anesthetist

## 2015-09-24 DIAGNOSIS — G894 Chronic pain syndrome: Secondary | ICD-10-CM | POA: Diagnosis not present

## 2015-09-24 DIAGNOSIS — J189 Pneumonia, unspecified organism: Secondary | ICD-10-CM

## 2015-09-24 DIAGNOSIS — J90 Pleural effusion, not elsewhere classified: Secondary | ICD-10-CM | POA: Diagnosis not present

## 2015-09-24 DIAGNOSIS — J440 Chronic obstructive pulmonary disease with acute lower respiratory infection: Secondary | ICD-10-CM | POA: Diagnosis not present

## 2015-09-24 DIAGNOSIS — J869 Pyothorax without fistula: Secondary | ICD-10-CM

## 2015-09-24 DIAGNOSIS — F418 Other specified anxiety disorders: Secondary | ICD-10-CM

## 2015-09-24 DIAGNOSIS — K219 Gastro-esophageal reflux disease without esophagitis: Secondary | ICD-10-CM | POA: Diagnosis not present

## 2015-09-24 DIAGNOSIS — J9601 Acute respiratory failure with hypoxia: Secondary | ICD-10-CM

## 2015-09-24 DIAGNOSIS — Z4682 Encounter for fitting and adjustment of non-vascular catheter: Secondary | ICD-10-CM | POA: Diagnosis not present

## 2015-09-24 DIAGNOSIS — R091 Pleurisy: Secondary | ICD-10-CM | POA: Diagnosis not present

## 2015-09-24 DIAGNOSIS — J449 Chronic obstructive pulmonary disease, unspecified: Secondary | ICD-10-CM | POA: Diagnosis not present

## 2015-09-24 DIAGNOSIS — E039 Hypothyroidism, unspecified: Secondary | ICD-10-CM | POA: Diagnosis not present

## 2015-09-24 HISTORY — PX: VIDEO BRONCHOSCOPY: SHX5072

## 2015-09-24 HISTORY — PX: VIDEO ASSISTED THORACOSCOPY (VATS)/DECORTICATION: SHX6171

## 2015-09-24 LAB — POCT I-STAT 7, (LYTES, BLD GAS, ICA,H+H)
Acid-Base Excess: 4 mmol/L — ABNORMAL HIGH (ref 0.0–2.0)
Bicarbonate: 30.1 mEq/L — ABNORMAL HIGH (ref 20.0–24.0)
Calcium, Ion: 1.15 mmol/L (ref 1.12–1.23)
HCT: 40 % (ref 36.0–46.0)
Hemoglobin: 13.6 g/dL (ref 12.0–15.0)
O2 Saturation: 99 %
Patient temperature: 37.1
Potassium: 3.8 mmol/L (ref 3.5–5.1)
Sodium: 142 mmol/L (ref 135–145)
TCO2: 32 mmol/L (ref 0–100)
pCO2 arterial: 50.5 mmHg — ABNORMAL HIGH (ref 35.0–45.0)
pH, Arterial: 7.384 (ref 7.350–7.450)
pO2, Arterial: 153 mmHg — ABNORMAL HIGH (ref 80.0–100.0)

## 2015-09-24 LAB — BLOOD GAS, ARTERIAL
Acid-Base Excess: 6.4 mmol/L — ABNORMAL HIGH (ref 0.0–2.0)
Bicarbonate: 30.7 mEq/L — ABNORMAL HIGH (ref 20.0–24.0)
Drawn by: 270271
O2 Content: 5 L/min
O2 Saturation: 94.9 %
Patient temperature: 98.6
TCO2: 32.2 mmol/L (ref 0–100)
pCO2 arterial: 47.1 mmHg — ABNORMAL HIGH (ref 35.0–45.0)
pH, Arterial: 7.43 (ref 7.350–7.450)
pO2, Arterial: 76.7 mmHg — ABNORMAL LOW (ref 80.0–100.0)

## 2015-09-24 LAB — GRAM STAIN

## 2015-09-24 LAB — CBC
HCT: 38.6 % (ref 36.0–46.0)
Hemoglobin: 12.1 g/dL (ref 12.0–15.0)
MCH: 30.3 pg (ref 26.0–34.0)
MCHC: 31.3 g/dL (ref 30.0–36.0)
MCV: 96.5 fL (ref 78.0–100.0)
Platelets: 328 10*3/uL (ref 150–400)
RBC: 4 MIL/uL (ref 3.87–5.11)
RDW: 12.5 % (ref 11.5–15.5)
WBC: 12.7 10*3/uL — ABNORMAL HIGH (ref 4.0–10.5)

## 2015-09-24 LAB — URINALYSIS, ROUTINE W REFLEX MICROSCOPIC
Bilirubin Urine: NEGATIVE
Glucose, UA: NEGATIVE mg/dL
Hgb urine dipstick: NEGATIVE
Ketones, ur: NEGATIVE mg/dL
Leukocytes, UA: NEGATIVE
Nitrite: NEGATIVE
Protein, ur: NEGATIVE mg/dL
Specific Gravity, Urine: 1.016 (ref 1.005–1.030)
pH: 7.5 (ref 5.0–8.0)

## 2015-09-24 LAB — PROTIME-INR
INR: 1.33 (ref 0.00–1.49)
Prothrombin Time: 16.6 seconds — ABNORMAL HIGH (ref 11.6–15.2)

## 2015-09-24 LAB — APTT: aPTT: 30 seconds (ref 24–37)

## 2015-09-24 LAB — SURGICAL PCR SCREEN
MRSA, PCR: NEGATIVE
Staphylococcus aureus: NEGATIVE

## 2015-09-24 SURGERY — BRONCHOSCOPY, VIDEO-ASSISTED
Anesthesia: General | Site: Chest | Laterality: Right

## 2015-09-24 MED ORDER — ACETAMINOPHEN 160 MG/5ML PO SOLN: 1000.0000 mg | Freq: Four times a day (QID) | ORAL | Status: DC

## 2015-09-24 MED ORDER — EPHEDRINE SULFATE 50 MG/ML IJ SOLN
INTRAMUSCULAR | Status: AC
  Filled 2015-09-24: qty 1

## 2015-09-24 MED ORDER — FENTANYL 40 MCG/ML IV SOLN
INTRAVENOUS | Status: DC
  Administered 2015-09-24: 13:00:00 via INTRAVENOUS
  Administered 2015-09-24: 120 ug via INTRAVENOUS
  Administered 2015-09-24: 195 ug via INTRAVENOUS
  Administered 2015-09-25: 120 ug via INTRAVENOUS
  Administered 2015-09-25: 135 ug via INTRAVENOUS
  Administered 2015-09-25: 30 ug via INTRAVENOUS
  Administered 2015-09-25: 225 ug via INTRAVENOUS
  Administered 2015-09-25: 30 ug via INTRAVENOUS
  Administered 2015-09-25: 90 ug via INTRAVENOUS
  Administered 2015-09-25: 15:00:00 via INTRAVENOUS
  Administered 2015-09-26: 60 ug via INTRAVENOUS
  Administered 2015-09-26: 150 ug via INTRAVENOUS
  Administered 2015-09-26: 165 ug via INTRAVENOUS
  Administered 2015-09-26: 75 ug via INTRAVENOUS
  Administered 2015-09-26: 150 ug via INTRAVENOUS
  Administered 2015-09-26: 0 ug via INTRAVENOUS
  Administered 2015-09-27: 14:00:00 via INTRAVENOUS
  Administered 2015-09-27 (×2): 60 ug via INTRAVENOUS
  Administered 2015-09-27: 15 ug via INTRAVENOUS
  Administered 2015-09-27 – 2015-09-28 (×2): 0 ug via INTRAVENOUS
  Administered 2015-09-28: 105 ug via INTRAVENOUS
  Administered 2015-09-28: 45 ug via INTRAVENOUS
  Filled 2015-09-24 (×3): qty 25

## 2015-09-24 MED ORDER — ONDANSETRON HCL 4 MG/2ML IJ SOLN: 4.0000 mg | Freq: Four times a day (QID) | INTRAMUSCULAR | Status: DC | PRN

## 2015-09-24 MED ORDER — SENNOSIDES-DOCUSATE SODIUM 8.6-50 MG PO TABS: 1.0000 | ORAL_TABLET | Freq: Every day | ORAL | Status: DC

## 2015-09-24 MED ORDER — ROCURONIUM BROMIDE 50 MG/5ML IV SOLN
INTRAVENOUS | Status: AC
  Filled 2015-09-24: qty 1

## 2015-09-24 MED ORDER — LIDOCAINE HCL (CARDIAC) 20 MG/ML IV SOLN
INTRAVENOUS | Status: AC
  Filled 2015-09-24: qty 5

## 2015-09-24 MED ORDER — HYDROMORPHONE HCL 1 MG/ML IJ SOLN
0.2500 mg | INTRAMUSCULAR | Status: DC | PRN
  Administered 2015-09-24 (×4): 0.5 mg via INTRAVENOUS

## 2015-09-24 MED ORDER — METOCLOPRAMIDE HCL 5 MG/ML IJ SOLN
10.0000 mg | Freq: Four times a day (QID) | INTRAMUSCULAR | Status: DC
  Administered 2015-09-24 – 2015-09-27 (×11): 10 mg via INTRAVENOUS
  Filled 2015-09-24 (×11): qty 2

## 2015-09-24 MED ORDER — PHENYLEPHRINE 40 MCG/ML (10ML) SYRINGE FOR IV PUSH (FOR BLOOD PRESSURE SUPPORT)
PREFILLED_SYRINGE | INTRAVENOUS | Status: AC
Start: 2015-09-24 — End: 2015-09-24
  Filled 2015-09-24: qty 10

## 2015-09-24 MED ORDER — BISACODYL 5 MG PO TBEC
10.0000 mg | DELAYED_RELEASE_TABLET | Freq: Every day | ORAL | Status: DC
Start: 2015-09-24 — End: 2015-09-29
  Administered 2015-09-24 – 2015-09-25 (×2): 10 mg via ORAL
  Filled 2015-09-24 (×2): qty 2

## 2015-09-24 MED ORDER — ONDANSETRON HCL 4 MG/2ML IJ SOLN
INTRAMUSCULAR | Status: AC
  Administered 2015-09-24: 4 mg via INTRAVENOUS
  Filled 2015-09-24: qty 2

## 2015-09-24 MED ORDER — ONDANSETRON HCL 4 MG/2ML IJ SOLN
4.0000 mg | Freq: Four times a day (QID) | INTRAMUSCULAR | Status: DC | PRN
Start: 2015-09-24 — End: 2015-09-29

## 2015-09-24 MED ORDER — GLYCOPYRROLATE 0.2 MG/ML IJ SOLN
INTRAMUSCULAR | Status: DC | PRN
  Administered 2015-09-24: 0.4 mg via INTRAVENOUS

## 2015-09-24 MED ORDER — NEOSTIGMINE METHYLSULFATE 10 MG/10ML IV SOLN
INTRAVENOUS | Status: DC | PRN
  Administered 2015-09-24: 3 mg via INTRAVENOUS

## 2015-09-24 MED ORDER — NALOXONE HCL 0.4 MG/ML IJ SOLN
0.4000 mg | INTRAMUSCULAR | Status: DC | PRN
  Filled 2015-09-24: qty 1

## 2015-09-24 MED ORDER — HYDROMORPHONE HCL 1 MG/ML IJ SOLN
INTRAMUSCULAR | Status: AC
  Administered 2015-09-24: 0.5 mg via INTRAVENOUS
  Filled 2015-09-24: qty 1

## 2015-09-24 MED ORDER — DEXAMETHASONE SODIUM PHOSPHATE 10 MG/ML IJ SOLN
INTRAMUSCULAR | Status: DC | PRN
  Administered 2015-09-24: 10 mg via INTRAVENOUS

## 2015-09-24 MED ORDER — METOCLOPRAMIDE HCL 5 MG/ML IJ SOLN
INTRAMUSCULAR | Status: AC
  Administered 2015-09-24: 10 mg via INTRAVENOUS
  Filled 2015-09-24: qty 2

## 2015-09-24 MED ORDER — POTASSIUM CHLORIDE 10 MEQ/50ML IV SOLN
10.0000 meq | Freq: Every day | INTRAVENOUS | Status: DC | PRN
  Administered 2015-09-26: 10 meq via INTRAVENOUS
  Filled 2015-09-24: qty 50

## 2015-09-24 MED ORDER — SUCCINYLCHOLINE CHLORIDE 20 MG/ML IJ SOLN
INTRAMUSCULAR | Status: AC
  Filled 2015-09-24: qty 1

## 2015-09-24 MED ORDER — PROPOFOL 10 MG/ML IV BOLUS
INTRAVENOUS | Status: AC
  Filled 2015-09-24: qty 20

## 2015-09-24 MED ORDER — FENTANYL CITRATE (PF) 100 MCG/2ML IJ SOLN
INTRAMUSCULAR | Status: DC | PRN
  Administered 2015-09-24 (×3): 50 ug via INTRAVENOUS
  Administered 2015-09-24: 100 ug via INTRAVENOUS

## 2015-09-24 MED ORDER — ONDANSETRON HCL 4 MG PO TABS: 4.0000 mg | ORAL_TABLET | Freq: Four times a day (QID) | ORAL | Status: DC | PRN

## 2015-09-24 MED ORDER — FENTANYL 40 MCG/ML IV SOLN
INTRAVENOUS | Status: AC
  Filled 2015-09-24: qty 25

## 2015-09-24 MED ORDER — ACETAMINOPHEN 500 MG PO TABS
1000.0000 mg | ORAL_TABLET | Freq: Four times a day (QID) | ORAL | Status: DC
  Administered 2015-09-24 – 2015-09-28 (×14): 1000 mg via ORAL
  Filled 2015-09-24 (×14): qty 2

## 2015-09-24 MED ORDER — TRAMADOL HCL 50 MG PO TABS
50.0000 mg | ORAL_TABLET | Freq: Four times a day (QID) | ORAL | Status: DC | PRN
  Filled 2015-09-24: qty 2

## 2015-09-24 MED ORDER — FENTANYL CITRATE (PF) 250 MCG/5ML IJ SOLN
INTRAMUSCULAR | Status: AC
  Filled 2015-09-24: qty 5

## 2015-09-24 MED ORDER — LIDOCAINE HCL (CARDIAC) 20 MG/ML IV SOLN
INTRAVENOUS | Status: DC | PRN
  Administered 2015-09-24: 80 mg via INTRAVENOUS

## 2015-09-24 MED ORDER — ROCURONIUM BROMIDE 50 MG/5ML IV SOLN
INTRAVENOUS | Status: AC
  Filled 2015-09-24: qty 2

## 2015-09-24 MED ORDER — POTASSIUM CHLORIDE IN NACL 20-0.9 MEQ/L-% IV SOLN
INTRAVENOUS | Status: DC
  Administered 2015-09-24 – 2015-09-25 (×2): via INTRAVENOUS
  Filled 2015-09-24 (×2): qty 1000

## 2015-09-24 MED ORDER — DEXAMETHASONE SODIUM PHOSPHATE 10 MG/ML IJ SOLN
INTRAMUSCULAR | Status: AC
Start: 2015-09-24 — End: 2015-09-24
  Filled 2015-09-24: qty 1

## 2015-09-24 MED ORDER — SODIUM CHLORIDE 0.9 % IJ SOLN: 9.0000 mL | INTRAMUSCULAR | Status: DC | PRN

## 2015-09-24 MED ORDER — PROMETHAZINE HCL 25 MG/ML IJ SOLN: 6.2500 mg | INTRAMUSCULAR | Status: DC | PRN

## 2015-09-24 MED ORDER — ACETAMINOPHEN 10 MG/ML IV SOLN
INTRAVENOUS | Status: AC
  Administered 2015-09-24: 1000 mg via INTRAVENOUS
  Filled 2015-09-24: qty 100

## 2015-09-24 MED ORDER — PROPOFOL 10 MG/ML IV BOLUS
INTRAVENOUS | Status: DC | PRN
  Administered 2015-09-24: 150 mg via INTRAVENOUS

## 2015-09-24 MED ORDER — ACETAMINOPHEN 10 MG/ML IV SOLN
1000.0000 mg | Freq: Once | INTRAVENOUS | Status: AC
  Administered 2015-09-24: 1000 mg via INTRAVENOUS

## 2015-09-24 MED ORDER — 0.9 % SODIUM CHLORIDE (POUR BTL) OPTIME
TOPICAL | Status: DC | PRN
  Administered 2015-09-24: 3000 mL

## 2015-09-24 MED ORDER — LEVALBUTEROL HCL 0.63 MG/3ML IN NEBU
0.6300 mg | INHALATION_SOLUTION | Freq: Three times a day (TID) | RESPIRATORY_TRACT | Status: DC
  Administered 2015-09-25: 0.63 mg via RESPIRATORY_TRACT
  Filled 2015-09-24: qty 3

## 2015-09-24 MED ORDER — DIPHENHYDRAMINE HCL 50 MG/ML IJ SOLN: 12.5000 mg | Freq: Four times a day (QID) | INTRAMUSCULAR | Status: DC | PRN

## 2015-09-24 MED ORDER — LACTATED RINGERS IV SOLN
INTRAVENOUS | Status: DC | PRN
  Administered 2015-09-24: 08:00:00 via INTRAVENOUS

## 2015-09-24 MED ORDER — LEVALBUTEROL HCL 0.63 MG/3ML IN NEBU: 0.6300 mg | INHALATION_SOLUTION | Freq: Four times a day (QID) | RESPIRATORY_TRACT | Status: DC

## 2015-09-24 MED ORDER — DIPHENHYDRAMINE HCL 12.5 MG/5ML PO ELIX: 12.5000 mg | ORAL_SOLUTION | Freq: Four times a day (QID) | ORAL | Status: DC | PRN

## 2015-09-24 MED ORDER — MIDAZOLAM HCL 2 MG/2ML IJ SOLN
INTRAMUSCULAR | Status: AC
  Filled 2015-09-24: qty 2

## 2015-09-24 MED ORDER — ROCURONIUM BROMIDE 100 MG/10ML IV SOLN
INTRAVENOUS | Status: DC | PRN
  Administered 2015-09-24: 20 mg via INTRAVENOUS
  Administered 2015-09-24 (×2): 10 mg via INTRAVENOUS
  Administered 2015-09-24: 50 mg via INTRAVENOUS
  Administered 2015-09-24: 30 mg via INTRAVENOUS

## 2015-09-24 MED ORDER — SODIUM CHLORIDE 0.9 % IJ SOLN
INTRAMUSCULAR | Status: AC
  Filled 2015-09-24: qty 10

## 2015-09-24 MED ORDER — PROPOFOL 10 MG/ML IV BOLUS
INTRAVENOUS | Status: AC
Start: 2015-09-24 — End: 2015-09-24
  Filled 2015-09-24: qty 20

## 2015-09-24 MED ORDER — ONDANSETRON HCL 4 MG/2ML IJ SOLN
INTRAMUSCULAR | Status: AC
  Filled 2015-09-24: qty 2

## 2015-09-24 SURGICAL SUPPLY — 86 items
APPLICATOR TIP EXT COSEAL (VASCULAR PRODUCTS) IMPLANT
BLOCK BITE 60FR ADLT L/F BLUE (MISCELLANEOUS) ×3 IMPLANT
BRUSH CYTOL CELLEBRITY 1.5X140 (MISCELLANEOUS) IMPLANT
CANISTER SUCTION 2500CC (MISCELLANEOUS) ×3 IMPLANT
CATH KIT ON Q 5IN SLV (PAIN MANAGEMENT) IMPLANT
CATH THORACIC 28FR (CATHETERS) ×3 IMPLANT
CATH THORACIC 28FR RT ANG (CATHETERS) IMPLANT
CATH THORACIC 36FR (CATHETERS) IMPLANT
CATH THORACIC 36FR RT ANG (CATHETERS) IMPLANT
CLEANER TIP ELECTROSURG 2X2 (MISCELLANEOUS) ×3 IMPLANT
CLIP TI MEDIUM 6 (CLIP) ×3 IMPLANT
CONN ST 1/4X3/8  BEN (MISCELLANEOUS) ×2
CONN ST 1/4X3/8 BEN (MISCELLANEOUS) ×4 IMPLANT
CONN Y 3/8X3/8X3/8  BEN (MISCELLANEOUS) ×1
CONN Y 3/8X3/8X3/8 BEN (MISCELLANEOUS) ×2 IMPLANT
CONT SPEC 4OZ CLIKSEAL STRL BL (MISCELLANEOUS) ×18 IMPLANT
COTTONBALL LRG STERILE PKG (GAUZE/BANDAGES/DRESSINGS) IMPLANT
COVER SURGICAL LIGHT HANDLE (MISCELLANEOUS) IMPLANT
COVER TABLE BACK 60X90 (DRAPES) IMPLANT
DERMABOND ADVANCED (GAUZE/BANDAGES/DRESSINGS) ×1
DERMABOND ADVANCED .7 DNX12 (GAUZE/BANDAGES/DRESSINGS) ×2 IMPLANT
DRAIN CHANNEL 32F RND 10.7 FF (WOUND CARE) ×6 IMPLANT
DRAPE LAPAROSCOPIC ABDOMINAL (DRAPES) ×3 IMPLANT
DRAPE PROXIMA HALF (DRAPES) ×3 IMPLANT
DRAPE SLUSH MACHINE 52X66 (DRAPES) ×3 IMPLANT
DRAPE WARM FLUID 44X44 (DRAPE) IMPLANT
ELECT BLADE 6.5 EXT (BLADE) ×3 IMPLANT
ELECT REM PT RETURN 9FT ADLT (ELECTROSURGICAL) ×3
ELECTRODE REM PT RTRN 9FT ADLT (ELECTROSURGICAL) ×2 IMPLANT
FILTER STRAW FLUID ASPIR (MISCELLANEOUS) IMPLANT
FORCEPS BIOP RJ4 1.8 (CUTTING FORCEPS) IMPLANT
GAUZE SPONGE 4X4 12PLY STRL (GAUZE/BANDAGES/DRESSINGS) ×6 IMPLANT
GLOVE SURG SIGNA 7.5 PF LTX (GLOVE) ×9 IMPLANT
GOWN STRL REUS W/ TWL LRG LVL3 (GOWN DISPOSABLE) ×4 IMPLANT
GOWN STRL REUS W/ TWL XL LVL3 (GOWN DISPOSABLE) ×2 IMPLANT
GOWN STRL REUS W/TWL LRG LVL3 (GOWN DISPOSABLE) ×2
GOWN STRL REUS W/TWL XL LVL3 (GOWN DISPOSABLE) ×1
HANDLE STAPLE ENDO GIA SHORT (STAPLE)
HEMOSTAT SURGICEL 2X14 (HEMOSTASIS) IMPLANT
KIT BASIN OR (CUSTOM PROCEDURE TRAY) ×3 IMPLANT
KIT CLEAN ENDO COMPLIANCE (KITS) ×3 IMPLANT
KIT ROOM TURNOVER OR (KITS) ×3 IMPLANT
NEEDLE 22X1 1/2 (OR ONLY) (NEEDLE) IMPLANT
NEEDLE BIOPSY TRANSBRONCH 21G (NEEDLE) IMPLANT
NS IRRIG 1000ML POUR BTL (IV SOLUTION) ×9 IMPLANT
PACK CHEST (CUSTOM PROCEDURE TRAY) ×3 IMPLANT
PAD ARMBOARD 7.5X6 YLW CONV (MISCELLANEOUS) ×6 IMPLANT
POUCH ENDO CATCH II 15MM (MISCELLANEOUS) IMPLANT
POUCH SPECIMEN RETRIEVAL 10MM (ENDOMECHANICALS) IMPLANT
SEALANT PROGEL (MISCELLANEOUS) IMPLANT
SEALANT SURG COSEAL 4ML (VASCULAR PRODUCTS) IMPLANT
SEALANT SURG COSEAL 8ML (VASCULAR PRODUCTS) IMPLANT
SOLUTION ANTI FOG 6CC (MISCELLANEOUS) ×6 IMPLANT
SPONGE INTESTINAL PEANUT (DISPOSABLE) ×15 IMPLANT
SPONGE LAP 18X18 X RAY DECT (DISPOSABLE) ×3 IMPLANT
SPONGE TONSIL 1 RF SGL (DISPOSABLE) ×3 IMPLANT
STAPLER ENDO GIA 12MM SHORT (STAPLE) IMPLANT
SUT PROLENE 4 0 RB 1 (SUTURE) ×1
SUT PROLENE 4-0 RB1 .5 CRCL 36 (SUTURE) ×2 IMPLANT
SUT SILK  1 MH (SUTURE) ×3
SUT SILK 1 MH (SUTURE) ×6 IMPLANT
SUT SILK 1 TIES 10X30 (SUTURE) ×3 IMPLANT
SUT SILK 2 0SH CR/8 30 (SUTURE) ×3 IMPLANT
SUT SILK 3 0SH CR/8 30 (SUTURE) IMPLANT
SUT VIC AB 1 CTX 36 (SUTURE) ×2
SUT VIC AB 1 CTX36XBRD ANBCTR (SUTURE) ×4 IMPLANT
SUT VIC AB 2-0 CTX 36 (SUTURE) ×6 IMPLANT
SUT VIC AB 3-0 MH 27 (SUTURE) IMPLANT
SUT VIC AB 3-0 X1 27 (SUTURE) ×6 IMPLANT
SUT VICRYL 2 TP 1 (SUTURE) IMPLANT
SYR 20ML ECCENTRIC (SYRINGE) ×3 IMPLANT
SYR 5ML LL (SYRINGE) IMPLANT
SYR 5ML LUER SLIP (SYRINGE) IMPLANT
SYR CONTROL 10ML LL (SYRINGE) IMPLANT
SYSTEM SAHARA CHEST DRAIN RE-I (WOUND CARE) ×3 IMPLANT
TAPE CLOTH 4X10 WHT NS (GAUZE/BANDAGES/DRESSINGS) ×3 IMPLANT
TAPE CLOTH SURG 4X10 WHT LF (GAUZE/BANDAGES/DRESSINGS) ×3 IMPLANT
TIP APPLICATOR SPRAY EXTEND 16 (VASCULAR PRODUCTS) IMPLANT
TOWEL OR 17X24 6PK STRL BLUE (TOWEL DISPOSABLE) ×6 IMPLANT
TOWEL OR 17X26 10 PK STRL BLUE (TOWEL DISPOSABLE) ×6 IMPLANT
TRAP SPECIMEN MUCOUS 40CC (MISCELLANEOUS) ×12 IMPLANT
TRAY FOLEY CATH 16FRSI W/METER (SET/KITS/TRAYS/PACK) ×3 IMPLANT
TROCAR XCEL BLADELESS 5X75MML (TROCAR) ×6 IMPLANT
TUBE CONNECTING 20X1/4 (TUBING) ×3 IMPLANT
TUNNELER SHEATH ON-Q 11GX8 DSP (PAIN MANAGEMENT) IMPLANT
WATER STERILE IRR 1000ML POUR (IV SOLUTION) ×6 IMPLANT

## 2015-09-24 NOTE — Anesthesia Postprocedure Evaluation (Signed)
Anesthesia Post Note  Patient: [name] [name]  Procedure(s) Performed: Procedure(s) (LRB): VIDEO BRONCHOSCOPY (N/A) VIDEO ASSISTED THORACOSCOPY (VATS)/DECORTICATION (Right)  Patient location during evaluation: PACU Anesthesia Type: General Level of consciousness: awake and alert Pain management: pain level controlled Vital Signs Assessment: post-procedure vital signs reviewed and stable Respiratory status: spontaneous breathing, nonlabored ventilation, respiratory function stable and patient connected to nasal cannula oxygen Cardiovascular status: blood pressure returned to baseline and stable Postop Assessment: no signs of nausea or vomiting Anesthetic complications: no    Last Vitals:  Filed Vitals:   09/24/15 1145 09/24/15 1200  BP: 140/89 141/82  Pulse: 75 72  Temp: 36.8 C   Resp: 22 20    Last Pain:  Filed Vitals:   09/24/15 1220  PainSc: 10-Worst pain ever                 Aubry Tucholski,JAMES TERRILL

## 2015-09-24 NOTE — Op Note (Signed)
[name] [name], [name] [name] NO.:  [id number]  MEDICAL RECORD NO.:  [id number]  LOCATION:  3S15C                        FACILITY:  [facility]  PHYSICIAN:  Revonda Standard. Roxan Hockey, M.D.DATE OF BIRTH:  1961-02-09  DATE OF PROCEDURE:  09/24/2015 DATE OF DISCHARGE:                              OPERATIVE REPORT   PREOPERATIVE DIAGNOSIS:  Right empyema.  POSTOPERATIVE DIAGNOSIS:  Right empyema.  PROCEDURE:   Video bronchoscopy. Right video-assisted thoracoscopy with Drainage of empyema and Decortication of visceral and parietal pleura.  SURGEON:  Revonda Standard. Roxan Hockey, MD  ASSISTANT:  Ellwood Handler, PA-C.  ANESTHESIA:  General.  FINDINGS:   Bronchoscopy: normal endobronchial anatomy with no endobronchial lesions to the level of subsegmental bronchi.  Minimal secretions.  VATS: early organizing empyema with extensive peel on lower and middle lobes.  CLINICAL NOTE:  [name] [name] is a 55 year old woman who presented with cough, shortness of breath, and pleuritic chest pain.  She was treated for pneumonia, but developed a large loculated right pleural effusion. A thoracentesis was done at [facility], but only a minimal amount of fluid could be obtained.  She was transferred to [facility] for surgical drainage.  The patient was advised to undergo bronchoscopy and video- assisted thoracoscopy for drainage of the empyema and decortication. The indications, risks, benefits, and alternatives were discussed in detail with the patient.  She understood and accepted the risks and agreed to proceed.  OPERATIVE NOTE:  [name] [name] was brought to the preoperative holding area on September 24, 2015.  Anesthesia placed an arterial blood pressure monitoring line.  She was taken to the operating room, anesthetized, and intubated.  She was already on vancomycin and Zosyn.  Zosyn was dosed as scheduled.  Sequential compression devices were placed on the calves for DVT prophylaxis.  A Foley catheter was  placed.  Flexible fiberoptic bronchoscopy was performed via the endotracheal tube.  It revealed normal endobronchial anatomy with no endobronchial lesions to the level of subsegmental bronchi.  There were minimal clear secretions.  Bronchoalveolar lavage was performed from the right lower lobe and the specimen was sent for cultures.  The patient was reintubated with a double-lumen endotracheal tube.  She then was placed in a left lateral decubitus position and the right chest was prepped and draped in usual sterile fashion.  Single lung ventilation of the left lung was initiated and was tolerated well throughout the procedure.  An incision was made in the 7th intercostal space in the midaxillary line.  A sucker was placed into the chest.  A small amount of fluid was Obtained. A port and 5 mm thoracoscope were advanced into the chest. There were significant adhesions present.  A small working incision, approximately 6 cm in length, was made in the 4th interspace anterolaterally.  No rib spreading was performed during the procedure. Once the incision had been made, a finger was placed in the chest and adhesions were taken down.  There was an obvious early organizing empyema with multiple loculated fluid collections.  The tissue was very friable and bled easily.  The lung was freed up circumferentially and the fluid was evacuated.  There was extensive inflammatory debris on the parietal pleura.  The  diaphragm was relatively spared.  There were significant adhesions in the major fissure between the lower and middle lobes, these were opened up and fluid in that area was evacuated as well.  There was minimal peel on the upper lobe, but extensive fibrinous pleural peel on the lower and middle lobes.  Decortication was performed of the lower and middle lobes removing the vast majority of the peel. Lung was intermittently inflated to identify any areas where there could be restricted  expansion.  After completely removing the visceral pleural peel, the chest was copiously irrigated with warm saline.  A test inflation showed good expansion of all 3 lobes of the lung.  The parietal pleural peel then was removed.  The parietal pleural surface was very friable and bled easily.  There was extensive debris in the costophrenic angle, this was cleared.  The chest was again copiously irrigated with warm saline.  A 32-French Blake drain was placed posteriorly directed apically.  A 2nd 32-French Blake drain was placed via another incision and directed along the diaphragmatic surface, and finally a 28-French chest tube was placed anteriorly.  The tubes were secured at the skin with #1 silk sutures.  The patient was placed back on 2 lung ventilation.  The working incision was closed with a #1 Vicryl fascial suture.  The subcutaneous tissue and skin were closed in standard fashion.  Chest tubes were placed to suction.  The patient was placed back in a supine position.  She was extubated in the operating room and taken to the postanesthetic care unit in good condition.     Revonda Standard Roxan Hockey, M.D.     SCH/MEDQ  D:  09/24/2015  T:  09/24/2015  Job:  [id number]

## 2015-09-24 NOTE — Progress Notes (Signed)
PATIENT DETAILS Name: [name] [name] Age: 55 y.o. Sex: female Date of Birth: 07-Jun-1961 Admit Date: 09/19/2015 Admitting Physician Orvan Falconer, MD [id number], CAMMIE, MD  Subjective: Sleeping comfortably-seen in PACU  Assessment/Plan: Principal Problem: Acute Hypoxic Resp Failure:secondary to PNA and large loculated pleural effusion. Underwent VATS on 1/11. Titrate off O2  Active Problems: CAP with empyema:underwent VATS, drainage of empyema and placement of chest tube. Continue IV Abx, CTVS following. Follow cultures.  Hypothyroidism:continue Synthroid  Chronic Pain Syndrome/Fibromyalgia:continue narcotics.   Anxiety/Depression:stable, continue Zoloft and Wellbutrin  Obesity:counseled regarding importance of weight loss.   Disposition: Remain inpatient  Antimicrobial agents  See below  Anti-infectives    Start     Dose/Rate Route Frequency Ordered Stop   09/22/15 1400  [MAR Hold]  vancomycin (VANCOCIN) IVPB 1000 mg/200 mL premix     (MAR Hold since 09/24/15 0734)   1,000 mg 200 mL/hr over 60 Minutes Intravenous Every 12 hours 09/22/15 0728     09/22/15 0200  [MAR Hold]  piperacillin-tazobactam (ZOSYN) IVPB 3.375 g     (MAR Hold since 09/24/15 0734)   3.375 g 12.5 mL/hr over 240 Minutes Intravenous Every 8 hours 09/22/15 0132     09/22/15 0200  vancomycin (VANCOCIN) IVPB 1000 mg/200 mL premix     1,000 mg 200 mL/hr over 60 Minutes Intravenous Every 1 hr x 2 09/22/15 0132 09/22/15 0400   09/22/15 0130  levofloxacin (LEVAQUIN) IVPB 750 mg  Status:  Discontinued     750 mg 100 mL/hr over 90 Minutes Intravenous Every 24 hours 09/22/15 0125 09/23/15 1432   09/20/15 0400  azithromycin (ZITHROMAX) 500 mg in dextrose 5 % 250 mL IVPB  Status:  Discontinued     500 mg 250 mL/hr over 60 Minutes Intravenous Every 24 hours 09/19/15 0733 09/22/15 0125   09/19/15 1600  cefTRIAXone (ROCEPHIN) 1 g in dextrose 5 % 50 mL IVPB  Status:  Discontinued     1 g 100 mL/hr  over 30 Minutes Intravenous Every 24 hours 09/19/15 0733 09/22/15 0125   09/19/15 0400  cefTRIAXone (ROCEPHIN) 1 g in dextrose 5 % 50 mL IVPB     1 g 100 mL/hr over 30 Minutes Intravenous  Once 09/19/15 0349 09/19/15 0433   09/19/15 0400  azithromycin (ZITHROMAX) 500 mg in dextrose 5 % 250 mL IVPB  Status:  Discontinued     500 mg 250 mL/hr over 60 Minutes Intravenous Every 24 hours 09/19/15 0349 09/19/15 1306      DVT Prophylaxis: Prophylactic Lovenox   Code Status: Full code   Family Communication None at bedsdie  Procedures: VATS >>1/11  CONSULTS:  CTVS  Time spent 30 minutes-Greater than 50% of this time was spent in counseling, explanation of diagnosis, planning of further management, and coordination of care.  MEDICATIONS: Scheduled Meds: . [MAR Hold] buPROPion  200 mg Oral BID  . [MAR Hold] enoxaparin (LOVENOX) injection  50 mg Subcutaneous Q24H  . fentaNYL   Intravenous 6 times per day  . [MAR Hold] gabapentin  600 mg Oral TID  . [MAR Hold] levalbuterol  0.63 mg Nebulization Q6H  . [MAR Hold] levothyroxine  112 mcg Oral QAC breakfast  . [MAR Hold] methylPREDNISolone (SOLU-MEDROL) injection  40 mg Intravenous Q12H  . metoCLOPramide (REGLAN) injection  10 mg Intravenous 4 times per day  . [MAR Hold] morphine  60 mg Oral Q12H  . [MAR Hold] piperacillin-tazobactam (ZOSYN)  IV  3.375 g Intravenous Q8H  . [MAR Hold] senna-docusate  2 tablet Oral QHS  . [MAR Hold] sertraline  100 mg Oral Daily  . [MAR Hold] vancomycin  1,000 mg Intravenous Q12H   Continuous Infusions:  PRN Meds:.[MAR Hold] albuterol, [MAR Hold] bisacodyl, [MAR Hold] diazepam, diphenhydrAMINE **OR** diphenhydrAMINE, HYDROmorphone (DILAUDID) injection, [MAR Hold] methocarbamol, [MAR Hold]  morphine injection, naloxone **AND** sodium chloride, [MAR Hold] ondansetron **OR** [MAR Hold] ondansetron (ZOFRAN) IV, ondansetron (ZOFRAN) IV, promethazine, [MAR Hold] sodium chloride, [MAR Hold]  traZODone    PHYSICAL EXAM: Vital signs in last 24 hours: Filed Vitals:   09/24/15 1445 09/24/15 1500 09/24/15 1515 09/24/15 1545  BP: 145/78 141/77 146/73 143/74  Pulse: 72 73 71 66  Temp:    97.4 F (36.3 C)  TempSrc:      Resp: 19 18 17 17   Height:      Weight:      SpO2: 96% 96% 96% 96%    Weight change:  Filed Weights   09/19/15 0106 09/19/15 0638  Weight: 108.863 kg (240 lb) 108.228 kg (238 lb 9.6 oz)   Body mass index is 38.53 kg/(m^2).   Gen Exam: Awake and alert with clear speech.   Neck: Supple, No JVD.   Chest: B/L Clear.   CVS: S1 S2 Regular, no murmurs.  Abdomen: soft, BS +, non tender, non distended.  Extremities: no edema, lower extremities warm to touch. Neurologic: Non Focal.   Skin: No Rash.   Wounds: N/A.   Intake/Output from previous day:  Intake/Output Summary (Last 24 hours) at 09/24/15 1612 Last data filed at 09/24/15 1500  Gross per 24 hour  Intake   1642 ml  Output   1550 ml  Net     92 ml     LAB RESULTS: CBC  Recent Labs Lab 09/19/15 0200 09/20/15 0613 09/21/15 0621 09/23/15 1600 09/24/15 0509 09/24/15 1045  WBC 13.7* 21.1* 20.8* 11.1* 12.7*  --   HGB 14.3 13.2 12.5 11.8* 12.1 13.6  HCT 43.1 41.3 39.1 37.8 38.6 40.0  PLT 355 361 338 296 328  --   MCV 95.1 97.4 97.0 96.9 96.5  --   MCH 31.6 31.1 31.0 30.3 30.3  --   MCHC 33.2 32.0 32.0 31.2 31.3  --   RDW 12.0 12.5 12.3 12.5 12.5  --   LYMPHSABS 1.3  --   --   --   --   --   MONOABS 0.6  --   --   --   --   --   EOSABS 0.0  --   --   --   --   --   BASOSABS 0.0  --   --   --   --   --     Chemistries   Recent Labs Lab 09/19/15 0200 09/20/15 0613 09/23/15 1600 09/24/15 1045  NA 142 138 142 142  K 4.1 3.6 3.3* 3.8  CL 102 101 101  --   CO2 30 28 30   --   GLUCOSE 131* 137* 209*  --   BUN 13 10 11   --   CREATININE 0.77 0.70 0.79  --   CALCIUM 9.3 8.4* 8.7*  --     CBG: No results for input(s): GLUCAP in the last 168 hours.  GFR Estimated Creatinine  Clearance: 100.1 mL/min (by C-G formula based on Cr of 0.79).  Coagulation profile  Recent Labs Lab 09/24/15 0509  INR 1.33    Cardiac Enzymes  Recent Labs Lab  09/19/15 0521 09/19/15 1051 09/19/15 1654  TROPONINI <0.03 <0.03 <0.03    Invalid input(s): POCBNP No results for input(s): DDIMER in the last 72 hours. No results for input(s): HGBA1C in the last 72 hours. No results for input(s): CHOL, HDL, LDLCALC, TRIG, CHOLHDL, LDLDIRECT in the last 72 hours. No results for input(s): TSH, T4TOTAL, T3FREE, THYROIDAB in the last 72 hours.  Invalid input(s): FREET3 No results for input(s): VITAMINB12, FOLATE, FERRITIN, TIBC, IRON, RETICCTPCT in the last 72 hours. No results for input(s): LIPASE, AMYLASE in the last 72 hours.  Urine Studies No results for input(s): UHGB, CRYS in the last 72 hours.  Invalid input(s): UACOL, UAPR, USPG, UPH, UTP, UGL, UKET, UBIL, UNIT, UROB, ULEU, UEPI, UWBC, URBC, UBAC, CAST, UCOM, BILUA  MICROBIOLOGY: Recent Results (from the past 240 hour(s))  Culture, body fluid-bottle     Status: None (Preliminary result)   Collection Time: 09/22/15  2:21 PM  Result Value Ref Range Status   Specimen Description FLUID PLEURAL  Final   Special Requests BOTTLES DRAWN AEROBIC AND ANAEROBIC 6CC  Final   Culture PENDING  Incomplete   Report Status PENDING  Incomplete  Gram stain     Status: None (Preliminary result)   Collection Time: 09/22/15  2:25 PM  Result Value Ref Range Status   Specimen Description FLUID  Final   Special Requests NONE  Final   Gram Stain NO ORGANISMS SEEN PERFORMED AT [facility]   Final   Report Status PENDING  Incomplete  Surgical pcr screen     Status: None   Collection Time: 09/24/15 12:15 AM  Result Value Ref Range Status   MRSA, PCR NEGATIVE NEGATIVE Final   Staphylococcus aureus NEGATIVE NEGATIVE Final    Comment:        The Xpert SA Assay (FDA approved for NASAL specimens in patients over 34 years of age), is one component  of a comprehensive surveillance program.  Test performance has been validated by [facility] for patients greater than or equal to 54 year old. It is not intended to diagnose infection nor to guide or monitor treatment.   Culture, body fluid-bottle     Status: None (Preliminary result)   Collection Time: 09/24/15  9:43 AM  Result Value Ref Range Status   Specimen Description FLUID RIGHT PLEURAL  Final   Special Requests   Final    POF ZOSYN PART B BOTTLES DRAWN AEROBIC AND ANAEROBIC 10MLS   Culture PENDING  Incomplete   Report Status PENDING  Incomplete  Gram stain     Status: None   Collection Time: 09/24/15  9:43 AM  Result Value Ref Range Status   Specimen Description FLUID RIGHT PLEURAL  Final   Special Requests POF ZOSYN PART B  Final   Gram Stain   Final    RARE WBC PRESENT,BOTH PMN AND MONONUCLEAR NO ORGANISMS SEEN    Report Status 09/24/2015 FINAL  Final  Culture, body fluid-bottle     Status: None (Preliminary result)   Collection Time: 09/24/15  9:50 AM  Result Value Ref Range Status   Specimen Description FLUID RIGHT PLEURAL  Final   Special Requests POF ZOSYN PART C BOTTLES DRAWN AEROBIC ONLY 5MLS  Final   Culture PENDING  Incomplete   Report Status PENDING  Incomplete  Gram stain     Status: None   Collection Time: 09/24/15  9:50 AM  Result Value Ref Range Status   Specimen Description FLUID RIGHT PLEURAL  Final   Special Requests  POF ZOSYN PART C  Final   Gram Stain   Final    FEW WBC PRESENT,BOTH PMN AND MONONUCLEAR NO ORGANISMS SEEN    Report Status 09/24/2015 FINAL  Final    RADIOLOGY STUDIES/RESULTS: Dg Chest 1 View  09/22/2015  CLINICAL DATA:  Loculated RIGHT pleural effusion post thoracentesis EXAM: CHEST 1 VIEW COMPARISON:  CT chest 09/21/2015, chest radiograph 09/19/2015 FINDINGS: Enlargement of cardiac silhouette with pulmonary vascular congestion. Diffuse infiltrates question pulmonary edema. Moderate to large RIGHT pleural effusion, partially  loculated by prior CT. No apical pneumothorax identified. Persistent atelectasis of RIGHT middle and RIGHT lower lobes as well as portion of RIGHT upper lobe. Minimal LEFT basilar atelectasis. Several foci of gas at the mid RIGHT lung and at the RIGHT base are identified, question aerated lung versus potentially small foci of loculated pneumothorax post thoracentesis. Minimal degenerative disc disease changes thoracic spine. IMPRESSION: Pulmonary edema. Persistent moderate to large loculated RIGHT pleural effusion with extensive atelectasis of the mid to lower RIGHT lung. Several foci of gas noted in the RIGHT mid lung and at the lower RIGHT chest question foci of aerated lung versus small foci of loculated pneumothorax post RIGHT thoracentesis. Findings called to Dr. Luan Pulling on 09/22/2015 at 1430 hours. Electronically Signed   By: Lavonia Dana M.D.   On: 09/22/2015 14:33   Ct Angio Chest Pe W/cm &/or Wo Cm  09/21/2015  CLINICAL DATA:  Persistent hypoxia. Decreased oxygen saturation with increasing oxygen requirement. EXAM: CT ANGIOGRAPHY CHEST WITH CONTRAST TECHNIQUE: Multidetector CT imaging of the chest was performed using the standard protocol during bolus administration of intravenous contrast. Multiplanar CT image reconstructions and MIPs were obtained to evaluate the vascular anatomy. CONTRAST:  118mL OMNIPAQUE IOHEXOL 350 MG/ML SOLN COMPARISON:  CT PE protocol 09/19/2015, 2 days prior FINDINGS: There are no filling defects within the pulmonary arteries to suggest pulmonary embolus. Limited assessment of the lower lobe branches secondary to breathing motion artifact and consolidation on the right. Progressive consolidation and pleural effusion in the right hemithorax. Marked increasing in right pleural effusion, now moderate to large in circumferential. Consolidation in the right lower lobe concerning for pneumonia, with heterogeneous enhancement posteriorly. Small focus of air in the periphery of the  heterogeneity and may reflect developing abscess. Complete atelectasis or consolidation throughout the right middle lobe. Compressive atelectasis in the right upper lobe, with minimal ground-glass opacity in the aerated right upper lung. Scattered atelectasis and ground-glass opacities in the left lung. No left pleural effusion. Thoracic aorta is normal in caliber. There is no pericardial effusion. Development of mediastinal adenopathy in the interim is likely reactive. Previous prominent right hilar lymph node is obscured. Evaluation of the upper abdomen demonstrates no acute abnormality. There are no acute or suspicious osseous abnormalities. Review of the MIP images confirms the above findings. IMPRESSION: 1. No pulmonary embolus. 2. Marked progression in right pleural effusion, now moderate to large and circumferential. Consolidation in the right lower lobe with heterogeneity consistent with pneumonia, small focus of air in the periphery of the right lower lobe may reflect developing abscess. Complete consolidation throughout the right middle lobe, additional pneumonia versus compressive atelectasis. 3. Development of mediastinal adenopathy, likely reactive. Electronically Signed   By: Jeb Levering M.D.   On: 09/21/2015 21:34   Ct Angio Chest Pe W/cm &/or Wo Cm  09/19/2015  CLINICAL DATA:  Awoke with right-sided chest pain. Elevated D-dimer. EXAM: CT ANGIOGRAPHY CHEST WITH CONTRAST TECHNIQUE: Multidetector CT imaging of the chest was performed using  the standard protocol during bolus administration of intravenous contrast. Multiplanar CT image reconstructions and MIPs were obtained to evaluate the vascular anatomy. CONTRAST:  131mL OMNIPAQUE IOHEXOL 350 MG/ML SOLN COMPARISON:  Radiograph earlier this day. FINDINGS: No filling defects in the central pulmonary arteries. Breathing motion artifact, soft tissue attenuation and contrast bolus timing partially limits evaluation of the distal branches. Thoracic  aorta is normal in caliber. No evidence of dissection. No pericardial effusion. Prominent right hilar lymph node measures 10 mm short axis. No mediastinal adenopathy. Dense consolidation in the right lower lobe with air bronchograms consistent with pneumonia. Small adjacent right pleural effusion. Scattered linear opacities throughout both lungs consistent with atelectasis. No left pleural effusion. Breathing motion artifact partially obscures detailed evaluation. Evaluation of the upper abdomen demonstrates no acute abnormalities. There are no acute or suspicious osseous abnormalities. Motion artifact through the sternum. Postsurgical change in the cervical spine, partially included. Review of the MIP images confirms the above findings. IMPRESSION: 1. No central pulmonary embolus. 2. Dense right lower lobe consolidation consistent with pneumonia. Small right pleural effusion. Followup PA and lateral chest X-ray is recommended in 3-4 weeks following trial of antibiotic therapy to ensure resolution and exclude underlying malignancy. 3. Scattered atelectasis throughout both lungs. Breathing motion artifact partially obscures detailed evaluation. Electronically Signed   By: Jeb Levering M.D.   On: 09/19/2015 03:31   Dg Chest Port 1 View  09/24/2015  CLINICAL DATA:  Status post right lung surgery with chest tube placement. EXAM: PORTABLE CHEST 1 VIEW COMPARISON:  Chest x-ray 05/01/2016 FINDINGS: Status post evacuation of a large complex right pleural effusion with 2 chest tubes in place. Tiny residual pleural effusion and small apical pneumothorax. Right lung demonstrates mild edema and atelectasis. The left lung is stable. Minimal left basilar atelectasis. A right PICC line is in good position with its tip in the distal SVC near the cavoatrial junction. IMPRESSION: Status post evacuation of a large complex right pleural effusion with 2 chest tubes in place. There is a small apical pneumothorax. Mild edema and  atelectasis. Electronically Signed   By: Marijo Sanes M.D.   On: 09/24/2015 13:43   Dg Chest Portable 1 View  09/19/2015  CLINICAL DATA:  Acute onset of stabbing right rib cage pain. Initial encounter. EXAM: PORTABLE CHEST 1 VIEW COMPARISON:  Chest radiograph performed 04/08/2009 FINDINGS: The lungs are mildly hypoexpanded. Bibasilar airspace opacities, right greater than left, may reflect pneumonia or pulmonary edema. Underlying vascular congestion is noted. A small right pleural effusion is suspected. No pneumothorax is seen. The cardiomediastinal silhouette is borderline normal in size. No acute osseous abnormalities are seen. IMPRESSION: Lungs mildly hypoexpanded. Vascular congestion noted. Bibasilar airspace opacities, right greater than left, may reflect pneumonia or pulmonary edema. Suspect small right pleural effusion. Electronically Signed   By: Garald Balding M.D.   On: 09/19/2015 02:16   US Thoracentesis Asp Pleural Space W/img Guide  09/22/2015  CLINICAL DATA:  RIGHT pleural effusion EXAM: ULTRASOUND GUIDED DIAGNOSTIC AND THERAPEUTIC THORACENTESIS COMPARISON:  CT chest 09/21/2015 PROCEDURE: Procedure, benefits, and risks of procedure were discussed with patient. Written informed consent for procedure was obtained. Time out protocol followed. Pleural effusion localized by ultrasound at the posterior RIGHT hemithorax. Pleural effusion is complex, containing multiple scattered areas of internal echogenicity with multiple loculations/septations. Skin prepped and draped in usual sterile fashion. Skin and soft tissues anesthetized with 10 mL of 1% lidocaine. 8 French thoracentesis catheter placed into the RIGHT pleural space. 33 mL of serosanguineous  fluid aspirated by syringe pump. Procedure tolerated well by patient without immediate complication. COMPLICATIONS: No immediate complications ; post procedural chest radiograph reported separately. FINDINGS: A total of approximately 33 mL of RIGHT pleural  fluid was removed. Entire specimen was sent for requested laboratory analysis. IMPRESSION: Successful ultrasound guided RIGHT thoracentesis yielding 33 mL of pleural fluid. Sonographically the RIGHT pleural effusion appears complex and multiloculated. Findings called to Dr.  Luan Pulling on 09/22/2015 at 1430 hours. Electronically Signed   By: Lavonia Dana M.D.   On: 09/22/2015 14:38    Oren Binet, MD  [facility] Pager:[phone]  If 7PM-7AM, please contact night-coverage [email or web address] Password [email or web address] 09/24/2015, 4:12 PM   LOS: 5 days

## 2015-09-24 NOTE — Interval H&P Note (Signed)
History and Physical Interval Note:  09/24/2015 8:12 AM  [name] [name]  has presented today for surgery, with the diagnosis of RIGHT EMPYEMA  The various methods of treatment have been discussed with the patient and family. After consideration of risks, benefits and other options for treatment, the patient has consented to  Procedure(s): VIDEO BRONCHOSCOPY (N/A) VIDEO ASSISTED THORACOSCOPY (VATS)/DECORTICATION (Right) as a surgical intervention .  The patient's history has been reviewed, patient examined, no change in status, stable for surgery.  I have reviewed the patient's chart and labs.  Questions were answered to the patient's satisfaction.     Melrose Nakayama

## 2015-09-24 NOTE — Anesthesia Preprocedure Evaluation (Addendum)
Anesthesia Evaluation  Patient identified by MRN, date of birth, ID band Patient awake    Reviewed: Allergy & Precautions, NPO status , Patient's Chart, lab work & pertinent test results  Airway Mallampati: II  TM Distance: <3 FB Neck ROM: Full    Dental  (+) Teeth Intact, Dental Advisory Given   Pulmonary asthma , pneumonia, COPD, former smoker,    + rhonchi  + decreased breath sounds      Cardiovascular negative cardio ROS   Rhythm:Regular Rate:Normal     Neuro/Psych  Neuromuscular disease    GI/Hepatic GERD  ,  Endo/Other  Hypothyroidism Morbid obesity  Renal/GU      Musculoskeletal  (+) Fibromyalgia -  Abdominal (+) + obese,   Peds  Hematology   Anesthesia Other Findings   Reproductive/Obstetrics                           Anesthesia Physical Anesthesia Plan  ASA: III  Anesthesia Plan: General   Post-op Pain Management:    Induction: Intravenous  Airway Management Planned: Double Lumen EBT  Additional Equipment: Arterial line and CVP  Intra-op Plan:   Post-operative Plan: Possible Post-op intubation/ventilation and Extubation in OR  Informed Consent: I have reviewed the patients History and Physical, chart, labs and discussed the procedure including the risks, benefits and alternatives for the proposed anesthesia with the patient or authorized representative who has indicated his/her understanding and acceptance.   Dental advisory given  Plan Discussed with:   Anesthesia Plan Comments:        Anesthesia Quick Evaluation

## 2015-09-24 NOTE — Transfer of Care (Signed)
Immediate Anesthesia Transfer of Care Note  Patient: [name] [name]  Procedure(s) Performed: Procedure(s): VIDEO BRONCHOSCOPY (N/A) VIDEO ASSISTED THORACOSCOPY (VATS)/DECORTICATION (Right)  Patient Location: PACU  Anesthesia Type:General  Level of Consciousness: awake  Airway & Oxygen Therapy: Patient Spontanous Breathing and Patient connected to face mask oxygen  Post-op Assessment: Report given to RN, Post -op Vital signs reviewed and stable and Patient moving all extremities X 4  Post vital signs: stable  Last Vitals:  Filed Vitals:   09/23/15 2137 09/24/15 0508  BP: 126/72 132/74  Pulse: 74 72  Temp: 36.7 C 36.4 C  Resp: 20 18    Complications: No apparent anesthesia complications

## 2015-09-24 NOTE — Brief Op Note (Addendum)
09/19/2015 - 09/24/2015  11:13 AM  PATIENT:  [name] [name]  55 y.o. female  PRE-OPERATIVE DIAGNOSIS:  RIGHT EMPYEMA  POST-OPERATIVE DIAGNOSIS:  RIGHT EMPYEMA  PROCEDURE:  Procedure(s):  VIDEO BRONCHOSCOPY (N/A)  RIGHT VIDEO ASSISTED THORACOSCOPY (VATS) -Drainage of Empyema -Decortication of Lung  SURGEON:  Surgeon(s) and Role:    * Melrose Nakayama, MD - Primary  PHYSICIAN ASSISTANT: Erin Barrett PA-C  ANESTHESIA:   general  EBL:  Total I/O In: 900 [I.V.:900] Out: 150 [Blood:150]  BLOOD ADMINISTERED:none  DRAINS: 32 Blake x 2, 28 Straight Chest Tube   LOCAL MEDICATIONS USED:  NONE  SPECIMEN:  No Specimen  DISPOSITION OF SPECIMEN:  N/A  COUNTS:  YES  PLAN OF CARE: Admit to inpatient   PATIENT DISPOSITION:  PACU - hemodynamically stable.   Delay start of Pharmacological VTE agent (>24hrs) due to surgical blood loss or risk of bleeding: yes  FINDINGS:  Bronch- normal anatomy, no endobronchial lesions VATS- early organizing empyema

## 2015-09-24 NOTE — H&P (View-Only) (Signed)
Reason for Consult:Right empyema Referring Physician: Dr. Luan Pulling [facility]  [name] [name] is an 55 y.o. female.  HPI: 55 yo woman with a history of chronic back pain, asthmas/ COPD, hypothyroidism and GERD. She was in her usual state of health until last Friday. She began having more difficulty breathing with cough, SOB and wheezing. She went to her MD and was given antibiotics and steroids. Despite that she then developed bilateral chest pain, which progressed to severe right sided pleuritic CP. She went to the ED. Her D-dimer was elevated. A CT chest was done to rule out PE. It showed a dense RLL pneumonia and bilateral effusion. Her WBC was elevated. She was admitted and started on IV antibiotics. She ruled out for MI.  By 1/8 she was having more pain and SOB. A repeat CT showed a large loculated right effusion. An US guided thoracentesis was done. Only ~ 30 ml of fluid could be withdrawn. It was c/w an empyema. She was transferred to [facility] for further management.  She currently has moderate pleuritic pain, improved from 2 days ago. She still has increased WOB. She is currently on vanco and zosyn.  Zubrod Score: At the time of surgery this patient's most appropriate activity status/level should be described as: [x]     0    Normal activity, no symptoms []     1    Restricted in physical strenuous activity but ambulatory, able to do out light work []     2    Ambulatory and capable of self care, unable to do work activities, up and about >50 % of waking hours                              []     3    Only limited self care, in bed greater than 50% of waking hours []     4    Completely disabled, no self care, confined to bed or chair []     5    Moribund   Past Medical History  Diagnosis Date  . Asthma   . COPD (chronic obstructive pulmonary disease) ([place])   . Emphysema of lung ([place])   . GERD (gastroesophageal reflux disease)   . Thyroid disease     hypo thyroid  . Nerve damage      right leg  . Neuromuscular disorder ([facility])     fibromyalgia  . Anxiety   . Carpal tunnel syndrome 01/13/2015    Bilateral    Past Surgical History  Procedure Laterality Date  . Anterior cervical decomp/discectomy fusion      L4, L5   . Tonsillectomy      Family History  Problem Relation Age of Onset  . Colon cancer Neg Hx   . Esophageal cancer Neg Hx   . Stomach cancer Neg Hx   . Rectal cancer Neg Hx     Social History:  reports that she quit smoking about 7 years ago. She has never used smokeless tobacco. She reports that she does not drink alcohol or use illicit drugs.  Allergies:  Allergies  Allergen Reactions  . Celebrex [Celecoxib] Other (See Comments)    Severe epigastric pain  . Nsaids Other (See Comments)    SEVERE EPIGASTRIC PAIN  . Percocet [Oxycodone-Acetaminophen] Itching    Medications:  Scheduled: . buPROPion  200 mg Oral BID  . enoxaparin (LOVENOX) injection  50 mg Subcutaneous Q24H  . gabapentin  600  mg Oral TID  . levalbuterol  0.63 mg Nebulization Q6H  . levofloxacin  750 mg Intravenous Q24H  . levothyroxine  112 mcg Oral QAC breakfast  . methylPREDNISolone (SOLU-MEDROL) injection  40 mg Intravenous [id number]  . morphine  60 mg Oral Q12H  . piperacillin-tazobactam (ZOSYN)  IV  3.375 g Intravenous Q8H  . senna-docusate  2 tablet Oral QHS  . sertraline  100 mg Oral Daily  . vancomycin  1,000 mg Intravenous Q12H    Results for orders placed or performed during the hospital encounter of 09/19/15 (from the past 48 hour(s))  Blood gas, arterial     Status: Abnormal   Collection Time: 09/21/15  4:00 PM  Result Value Ref Range   O2 Content 6.0 L/min   Delivery systems NASAL CANNULA    pH, Arterial 7.413 7.350 - 7.450   pCO2 arterial 46.4 (H) 35.0 - 45.0 mmHg   pO2, Arterial 67.6 (L) 80.0 - 100.0 mmHg   Bicarbonate 28.2 (H) 20.0 - 24.0 mEq/L   TCO2 16.4 0 - 100 mmol/L   Acid-Base Excess 4.7 (H) 0.0 - 2.0 mmol/L   O2 Saturation 94.7 %    Patient temperature 37.0    Collection site RIGHT RADIAL    Drawn by [id number]    Sample type ARTERIAL    Allens test (pass/fail) PASS PASS  Lactate dehydrogenase (CSF, pleural or peritoneal fluid)     Status: Abnormal   Collection Time: 09/22/15  2:00 PM  Result Value Ref Range   LD, Fluid 3339 (H) 3 - 23 U/L    Comment: RESULTS CONFIRMED BY MANUAL DILUTION (NOTE) Results should be evaluated in conjunction with serum values    Fluid Type-FLDH Pleural Fld   Body fluid cell count with differential     Status: Abnormal   Collection Time: 09/22/15  2:00 PM  Result Value Ref Range   Fluid Type-FCT Body Fluid    Color, Fluid YELLOW YELLOW   Appearance, Fluid CLOUDY (A) CLEAR   WBC, Fluid 7830 (H) 0 - 1000 cu mm   Neutrophil Count, Fluid 36 (H) 0 - 25 %   Lymphs, Fluid 11 %   Monocyte-Macrophage-Serous Fluid 53 50 - 90 %   Eos, Fluid 0 %   Other Cells, Fluid RARE %    Comment: OTHER CELLS IDENTIFIED AS MESOTHELIAL CELLS REACTIVE CELLS PRESENT OTHER CELLS UNIDENTIFIED; SEE CYTOLOGY REPORT   Glucose, pleural or peritoneal fluid     Status: None   Collection Time: 09/22/15  2:00 PM  Result Value Ref Range   Glucose, Fluid <20 mg/dL    Comment: (NOTE) No normal range established for this test Results should be evaluated in conjunction with serum values    Fluid Type-FGLU Pleural Fld   Protein, fluid - pleural or peritoneal     Status: None   Collection Time: 09/22/15  2:08 PM  Result Value Ref Range   Total protein, fluid 5.2 g/dL    Comment: (NOTE) No normal range established for this test Results should be evaluated in conjunction with serum values    Fluid Type-FTP Pleural Fld   Culture, body fluid-bottle     Status: None (Preliminary result)   Collection Time: 09/22/15  2:21 PM  Result Value Ref Range   Specimen Description FLUID PLEURAL    Special Requests BOTTLES DRAWN AEROBIC AND ANAEROBIC 6CC    Culture PENDING    Report Status PENDING   Gram stain     Status: None  (Preliminary result)  Collection Time: 09/22/15  2:25 PM  Result Value Ref Range   Specimen Description FLUID    Special Requests NONE    Gram Stain NO ORGANISMS SEEN PERFORMED AT [facility]     Report Status PENDING     Dg Chest 1 View  09/22/2015  CLINICAL DATA:  Loculated RIGHT pleural effusion post thoracentesis EXAM: CHEST 1 VIEW COMPARISON:  CT chest 09/21/2015, chest radiograph 09/19/2015 FINDINGS: Enlargement of cardiac silhouette with pulmonary vascular congestion. Diffuse infiltrates question pulmonary edema. Moderate to large RIGHT pleural effusion, partially loculated by prior CT. No apical pneumothorax identified. Persistent atelectasis of RIGHT middle and RIGHT lower lobes as well as portion of RIGHT upper lobe. Minimal LEFT basilar atelectasis. Several foci of gas at the mid RIGHT lung and at the RIGHT base are identified, question aerated lung versus potentially small foci of loculated pneumothorax post thoracentesis. Minimal degenerative disc disease changes thoracic spine. IMPRESSION: Pulmonary edema. Persistent moderate to large loculated RIGHT pleural effusion with extensive atelectasis of the mid to lower RIGHT lung. Several foci of gas noted in the RIGHT mid lung and at the lower RIGHT chest question foci of aerated lung versus small foci of loculated pneumothorax post RIGHT thoracentesis. Findings called to Dr. Luan Pulling on 09/22/2015 at 1430 hours. Electronically Signed   By: Lavonia Dana M.D.   On: 09/22/2015 14:33   Ct Angio Chest Pe W/cm &/or Wo Cm  09/21/2015  CLINICAL DATA:  Persistent hypoxia. Decreased oxygen saturation with increasing oxygen requirement. EXAM: CT ANGIOGRAPHY CHEST WITH CONTRAST TECHNIQUE: Multidetector CT imaging of the chest was performed using the standard protocol during bolus administration of intravenous contrast. Multiplanar CT image reconstructions and MIPs were obtained to evaluate the vascular anatomy. CONTRAST:  190mL OMNIPAQUE IOHEXOL 350 MG/ML SOLN  COMPARISON:  CT PE protocol 09/19/2015, 2 days prior FINDINGS: There are no filling defects within the pulmonary arteries to suggest pulmonary embolus. Limited assessment of the lower lobe branches secondary to breathing motion artifact and consolidation on the right. Progressive consolidation and pleural effusion in the right hemithorax. Marked increasing in right pleural effusion, now moderate to large in circumferential. Consolidation in the right lower lobe concerning for pneumonia, with heterogeneous enhancement posteriorly. Small focus of air in the periphery of the heterogeneity and may reflect developing abscess. Complete atelectasis or consolidation throughout the right middle lobe. Compressive atelectasis in the right upper lobe, with minimal ground-glass opacity in the aerated right upper lung. Scattered atelectasis and ground-glass opacities in the left lung. No left pleural effusion. Thoracic aorta is normal in caliber. There is no pericardial effusion. Development of mediastinal adenopathy in the interim is likely reactive. Previous prominent right hilar lymph node is obscured. Evaluation of the upper abdomen demonstrates no acute abnormality. There are no acute or suspicious osseous abnormalities. Review of the MIP images confirms the above findings. IMPRESSION: 1. No pulmonary embolus. 2. Marked progression in right pleural effusion, now moderate to large and circumferential. Consolidation in the right lower lobe with heterogeneity consistent with pneumonia, small focus of air in the periphery of the right lower lobe may reflect developing abscess. Complete consolidation throughout the right middle lobe, additional pneumonia versus compressive atelectasis. 3. Development of mediastinal adenopathy, likely reactive. Electronically Signed   By: Jeb Levering M.D.   On: 09/21/2015 21:34   US Thoracentesis Asp Pleural Space W/img Guide  09/22/2015  CLINICAL DATA:  RIGHT pleural effusion EXAM:  ULTRASOUND GUIDED DIAGNOSTIC AND THERAPEUTIC THORACENTESIS COMPARISON:  CT chest 09/21/2015 PROCEDURE: Procedure, benefits, and  risks of procedure were discussed with patient. Written informed consent for procedure was obtained. Time out protocol followed. Pleural effusion localized by ultrasound at the posterior RIGHT hemithorax. Pleural effusion is complex, containing multiple scattered areas of internal echogenicity with multiple loculations/septations. Skin prepped and draped in usual sterile fashion. Skin and soft tissues anesthetized with 10 mL of 1% lidocaine. 8 French thoracentesis catheter placed into the RIGHT pleural space. 33 mL of serosanguineous fluid aspirated by syringe pump. Procedure tolerated well by patient without immediate complication. COMPLICATIONS: No immediate complications ; post procedural chest radiograph reported separately. FINDINGS: A total of approximately 33 mL of RIGHT pleural fluid was removed. Entire specimen was sent for requested laboratory analysis. IMPRESSION: Successful ultrasound guided RIGHT thoracentesis yielding 33 mL of pleural fluid. Sonographically the RIGHT pleural effusion appears complex and multiloculated. Findings called to Dr.  Luan Pulling on 09/22/2015 at 1430 hours. Electronically Signed   By: Lavonia Dana M.D.   On: 09/22/2015 14:38    Review of Systems  Constitutional: Positive for fever and malaise/fatigue. Negative for chills.  Eyes: Negative for blurred vision and double vision.  Respiratory: Positive for cough, shortness of breath and wheezing. Negative for sputum production.   Cardiovascular: Positive for chest pain (right sided pleuritic).  Gastrointestinal: Negative for nausea and vomiting.  Genitourinary: Negative.   Musculoskeletal: Positive for back pain and neck pain.  Neurological: Negative for seizures and loss of consciousness.  All other systems reviewed and are negative.  Blood pressure 136/84, pulse 78, temperature 98.1 F (36.7  C), temperature source Oral, resp. rate 20, height 5\' 6"  (1.676 m), weight 238 lb 9.6 oz (108.228 kg), SpO2 97 %. Physical Exam  Vitals reviewed. Constitutional: She is oriented to person, place, and time. No distress.  obese  HENT:  Head: Normocephalic and atraumatic.  Eyes: Conjunctivae and EOM are normal. No scleral icterus.  Neck: Neck supple. No thyromegaly present.  Respiratory: She has no wheezes. She has no rales.  Markedly diminished BS on right  GI: Soft. She exhibits no distension. There is no tenderness.  Musculoskeletal: She exhibits no edema.  Lymphadenopathy:    She has no cervical adenopathy.  Neurological: She is alert and oriented to person, place, and time. No cranial nerve deficit.  No focal deficit  Skin: Skin is warm and dry.   I personally reviewed all pertinent radiologic studies and concur with the findings as noted above.  Assessment/Plan: 55 yo woman with a right lower lobe pneumonia and right empyema. Right VATS, drainage of empyema and decortication is indicated to restore lung function and allow antibiotics to treat the infection. I will also plan to do a bronchoscopy at the time of surgery to r/o any endobronchial issue that could have led to the pneumonia.  I have discussed the general nature of the procedure, the need for general anesthesia, and the incisions to be used with Ms Gittleman in person and her partner, Shauna Hugh, by telephone. We discussed the expected hospital stay, overall recovery and short and long term outcomes. I reviewed the indications, risks, benefits and alternatives. They understand the risks include, but are not limited to death, stroke, MI, DVT/PE, bleeding, possible need for transfusion, infection, prolonged air leak, cardiac arrhythmias, as well as other unforeseeable complications. She accepts the risks and agrees to proceed.  Plan Bronchoscopy, Right VATS, drainage of empyema, decortication in OR tomorrow AM  Melrose Nakayama 09/23/2015, 9:26 AM

## 2015-09-24 NOTE — Anesthesia Procedure Notes (Addendum)
Procedure Name: Intubation Date/Time: 09/24/2015 8:48 AM Performed by: Rejeana Brock L Pre-anesthesia Checklist: Patient identified, Timeout performed, Emergency Drugs available, Suction available and Patient being monitored Patient Re-evaluated:Patient Re-evaluated prior to inductionOxygen Delivery Method: Circle system utilized Preoxygenation: Pre-oxygenation with 100% oxygen Intubation Type: IV induction Ventilation: Mask ventilation without difficulty Laryngoscope Size: Mac and 3 Grade View: Grade I Tube type: Oral Tube size: 8.5 mm Number of attempts: 1 Airway Equipment and Method: Stylet Placement Confirmation: ETT inserted through vocal cords under direct vision,  positive ETCO2 and breath sounds checked- equal and bilateral Secured at: 22 cm Tube secured with: Tape Dental Injury: Teeth and Oropharynx as per pre-operative assessment    Procedure Name: Intubation Date/Time: 09/24/2015 9:26 AM Performed by: Rejeana Brock L Pre-anesthesia Checklist: Patient identified, Timeout performed, Emergency Drugs available, Suction available and Patient being monitored Patient Re-evaluated:Patient Re-evaluated prior to inductionOxygen Delivery Method: Circle system utilized Preoxygenation: Pre-oxygenation with 100% oxygen Intubation Type: IV induction Ventilation: Mask ventilation without difficulty Laryngoscope Size: Mac and 3 Grade View: Grade I Tube type: Oral Endobronchial tube: Double lumen EBT and 35 Fr Number of attempts: 1 Airway Equipment and Method: Stylet Placement Confirmation: ETT inserted through vocal cords under direct vision,  breath sounds checked- equal and bilateral and positive ETCO2 Tube secured with: Tape Dental Injury: Teeth and Oropharynx as per pre-operative assessment

## 2015-09-25 ENCOUNTER — Inpatient Hospital Stay (HOSPITAL_COMMUNITY): Payer: PPO

## 2015-09-25 ENCOUNTER — Encounter (HOSPITAL_COMMUNITY): Payer: Self-pay | Admitting: Thoracic Surgery (Cardiothoracic Vascular Surgery)

## 2015-09-25 DIAGNOSIS — E039 Hypothyroidism, unspecified: Secondary | ICD-10-CM

## 2015-09-25 DIAGNOSIS — E669 Obesity, unspecified: Secondary | ICD-10-CM

## 2015-09-25 DIAGNOSIS — J189 Pneumonia, unspecified organism: Secondary | ICD-10-CM | POA: Diagnosis not present

## 2015-09-25 DIAGNOSIS — G894 Chronic pain syndrome: Secondary | ICD-10-CM | POA: Diagnosis not present

## 2015-09-25 DIAGNOSIS — J869 Pyothorax without fistula: Secondary | ICD-10-CM | POA: Diagnosis not present

## 2015-09-25 DIAGNOSIS — J9601 Acute respiratory failure with hypoxia: Secondary | ICD-10-CM | POA: Diagnosis not present

## 2015-09-25 DIAGNOSIS — F418 Other specified anxiety disorders: Secondary | ICD-10-CM | POA: Diagnosis not present

## 2015-09-25 DIAGNOSIS — J9690 Respiratory failure, unspecified, unspecified whether with hypoxia or hypercapnia: Secondary | ICD-10-CM | POA: Diagnosis not present

## 2015-09-25 LAB — CBC
HCT: 40.2 % (ref 36.0–46.0)
Hemoglobin: 12.4 g/dL (ref 12.0–15.0)
MCH: 29.9 pg (ref 26.0–34.0)
MCHC: 30.8 g/dL (ref 30.0–36.0)
MCV: 96.9 fL (ref 78.0–100.0)
Platelets: 384 10*3/uL (ref 150–400)
RBC: 4.15 MIL/uL (ref 3.87–5.11)
RDW: 12.7 % (ref 11.5–15.5)
WBC: 14.1 10*3/uL — ABNORMAL HIGH (ref 4.0–10.5)

## 2015-09-25 LAB — BASIC METABOLIC PANEL
Anion gap: 10 (ref 5–15)
BUN: 7 mg/dL (ref 6–20)
CO2: 31 mmol/L (ref 22–32)
Calcium: 8.6 mg/dL — ABNORMAL LOW (ref 8.9–10.3)
Chloride: 102 mmol/L (ref 101–111)
Creatinine, Ser: 0.54 mg/dL (ref 0.44–1.00)
GFR calc Af Amer: >60 mL/min (ref >60)
GFR calc non Af Amer: >60 mL/min (ref >60)
Glucose, Bld: 125 mg/dL — ABNORMAL HIGH (ref 65–99)
Potassium: 3.5 mmol/L (ref 3.5–5.1)
Sodium: 143 mmol/L (ref 135–145)

## 2015-09-25 LAB — BLOOD GAS, ARTERIAL
Acid-Base Excess: 6.1 mmol/L — ABNORMAL HIGH (ref 0.0–2.0)
Bicarbonate: 30.7 mEq/L — ABNORMAL HIGH (ref 20.0–24.0)
O2 Content: 2 L/min
O2 Saturation: 95.2 %
Patient temperature: 98.6
TCO2: 32.2 mmol/L (ref 0–100)
pCO2 arterial: 49.5 mmHg — ABNORMAL HIGH (ref 35.0–45.0)
pH, Arterial: 7.409 (ref 7.350–7.450)
pO2, Arterial: 80.3 mmHg (ref 80.0–100.0)

## 2015-09-25 MED ORDER — MORPHINE SULFATE ER 60 MG PO TBCR: 60.0000 mg | EXTENDED_RELEASE_TABLET | Freq: Two times a day (BID) | ORAL | Status: DC

## 2015-09-25 MED ORDER — LEVALBUTEROL HCL 0.63 MG/3ML IN NEBU
0.6300 mg | INHALATION_SOLUTION | Freq: Two times a day (BID) | RESPIRATORY_TRACT | Status: DC
  Administered 2015-09-25 – 2015-09-26 (×2): 0.63 mg via RESPIRATORY_TRACT
  Filled 2015-09-25 (×2): qty 3

## 2015-09-25 MED ORDER — PREDNISONE 20 MG PO TABS
40.0000 mg | ORAL_TABLET | Freq: Every day | ORAL | Status: DC
  Administered 2015-09-25 – 2015-09-26 (×2): 40 mg via ORAL
  Filled 2015-09-25 (×2): qty 2

## 2015-09-25 MED ORDER — SODIUM CHLORIDE 0.45 % IV SOLN
INTRAVENOUS | Status: DC
  Administered 2015-09-25: 09:00:00 via INTRAVENOUS

## 2015-09-25 MED ORDER — MORPHINE SULFATE 15 MG PO TABS
15.0000 mg | ORAL_TABLET | ORAL | Status: DC | PRN
  Administered 2015-09-25 – 2015-09-28 (×9): 15 mg via ORAL
  Filled 2015-09-25 (×9): qty 1

## 2015-09-25 NOTE — Progress Notes (Addendum)
      [address]       [place] [address]             [phone]      1 Day Post-Op Procedure(s) (LRB): VIDEO BRONCHOSCOPY (N/A) VIDEO ASSISTED THORACOSCOPY (VATS)/DECORTICATION (Right)   Subjective:  Ms. [name] [name] complains of pain this morning.  She states her PCA does not provide relief.  She states she uses MS Contin and Morphine at home.  Not yet ambulated  Objective: Vital signs in last 24 hours: Temp:  [97.4 F (36.3 C)-98.3 F (36.8 C)] 97.9 F (36.6 C) (01/12 0739) Pulse Rate:  [66-79] 79 (01/12 0318) Cardiac Rhythm:  [-] Normal sinus rhythm (01/12 0720) Resp:  [16-26] 18 (01/12 0739) BP: (134-156)/(73-89) 134/76 mmHg (01/12 0318) SpO2:  [95 %-98 %] 95 % (01/12 0739) Arterial Line BP: (85-171)/(67-92) 112/92 mmHg (01/11 1628) FiO2 (%):  [2 %] 2 % (01/11 1545)  Intake/Output from previous day: 01/11 0701 - 01/12 0700 In: 3350 [P.O.:600; I.V.:2500; IV Piggyback:250] Out: [id number] [Urine:4150; Blood:150; Chest Tube:535]  General appearance: alert, cooperative and no distress Heart: regular rate and rhythm Lungs: diminished breath sounds bibasilar and R>L Abdomen: soft, non-tender; bowel sounds normal; no masses,  no organomegaly Wound: clean and dry  Lab Results:  Recent Labs  09/24/15 0509 09/24/15 1045 09/25/15 0430  WBC 12.7*  --  14.1*  HGB 12.1 13.6 12.4  HCT 38.6 40.0 40.2  PLT 328  --  384   BMET:  Recent Labs  09/23/15 1600 09/24/15 1045 09/25/15 0430  NA 142 142 143  K 3.3* 3.8 3.5  CL 101  --  102  CO2 30  --  31  GLUCOSE 209*  --  125*  BUN 11  --  7  CREATININE 0.79  --  0.54  CALCIUM 8.7*  --  8.6*    PT/INR:  Recent Labs  09/24/15 0509  LABPROT 16.6*  INR 1.33   ABG    Component Value Date/Time   PHART 7.409 09/25/2015 0450   HCO3 30.7* 09/25/2015 0450   TCO2 32.2 09/25/2015 0450   O2SAT 95.2 09/25/2015 0450   CBG (last 3)  No results for input(s): GLUCAP in the last 72 hours.  Assessment/Plan: S/P  Procedure(s) (LRB): VIDEO BRONCHOSCOPY (N/A) VIDEO ASSISTED THORACOSCOPY (VATS)/DECORTICATION (Right)  1. Chest tube- no air leak appreciated, CXR shows improvement of pulmonary edema, small right apical pneumothorax, 535 cc output since surgery 2. Pulm- wean oxygen as tolerated, continue IS 3. Pain control- patient with chronic narcotic use, will d/c IV Morphine and add home MSIR 15mg  Q4 prn, continue MS Contin, PCA for now 4. D/C arterial line 5. IV fluids to [facility], patient taking full diet 6. Dispo- patient stable, no air leak, possibly move chest tubes to water seal, OR cultures remain negative on Vanc/Zosyn, care per primary   LOS: 6 days    BARRETT, ERIN 09/25/2015  Patient seen and examined agree with above She does not have an air leak so will dc anterior CT today Keep posterior and diaphragmatic tubes to suction today- water seal tomorrow if no issues On SCD + enoxaparin for DVT prophylaxis Pain control is an issue  Remo Lipps C. Roxan Hockey, MD [facility] [phone]

## 2015-09-25 NOTE — Progress Notes (Signed)
PATIENT DETAILS Name: [name] [name] Age: 55 y.o. Sex: female Date of Birth: 23-Jul-1961 Admit Date: 09/19/2015 Admitting Physician Orvan Falconer, MD SX:[id number], CAMMIE, MD  Subjective: Complains of pain at the operative site.   Assessment/Plan: Principal Problem: Acute Hypoxic Resp Failure:secondary to PNA and large loculated pleural effusion. Underwent VATS on 1/11. Titrate off O2  Active Problems: CAP with empyema:underwent VATS, drainage of empyema and placement of chest tube on 1/11. Continue IV Abx, CTVS following and managing post op care. Follow cultures.  Hypothyroidism:continue Synthroid  Chronic Pain Syndrome/Fibromyalgia:Has significant worsening of her pain-currently on IV Fentanyl PCA-in addition to usual chronic narcotics. Follow closely in SDU.  Anxiety/Depression:stable, continue Zoloft and Wellbutrin  Obesity:counseled regarding importance of weight loss.   Disposition: Remain inpatient-remain in SDU  Antimicrobial agents  See below  Anti-infectives    Start     Dose/Rate Route Frequency Ordered Stop   09/22/15 1400  vancomycin (VANCOCIN) IVPB 1000 mg/200 mL premix     1,000 mg 200 mL/hr over 60 Minutes Intravenous Every 12 hours 09/22/15 0728     09/22/15 0200  piperacillin-tazobactam (ZOSYN) IVPB 3.375 g     3.375 g 12.5 mL/hr over 240 Minutes Intravenous Every 8 hours 09/22/15 0132     09/22/15 0200  vancomycin (VANCOCIN) IVPB 1000 mg/200 mL premix     1,000 mg 200 mL/hr over 60 Minutes Intravenous Every 1 hr x 2 09/22/15 0132 09/22/15 0400   09/22/15 0130  levofloxacin (LEVAQUIN) IVPB 750 mg  Status:  Discontinued     750 mg 100 mL/hr over 90 Minutes Intravenous Every 24 hours 09/22/15 0125 09/23/15 1432   09/20/15 0400  azithromycin (ZITHROMAX) 500 mg in dextrose 5 % 250 mL IVPB  Status:  Discontinued     500 mg 250 mL/hr over 60 Minutes Intravenous Every 24 hours 09/19/15 0733 09/22/15 0125   09/19/15 1600  cefTRIAXone (ROCEPHIN) 1  g in dextrose 5 % 50 mL IVPB  Status:  Discontinued     1 g 100 mL/hr over 30 Minutes Intravenous Every 24 hours 09/19/15 0733 09/22/15 0125   09/19/15 0400  cefTRIAXone (ROCEPHIN) 1 g in dextrose 5 % 50 mL IVPB     1 g 100 mL/hr over 30 Minutes Intravenous  Once 09/19/15 0349 09/19/15 0433   09/19/15 0400  azithromycin (ZITHROMAX) 500 mg in dextrose 5 % 250 mL IVPB  Status:  Discontinued     500 mg 250 mL/hr over 60 Minutes Intravenous Every 24 hours 09/19/15 0349 09/19/15 1306      DVT Prophylaxis: Prophylactic Lovenox   Code Status: Full code   Family Communication None at bedsdie  Procedures: VATS >>1/11  CONSULTS:  CTVS  Time spent 25  minutes-Greater than 50% of this time was spent in counseling, explanation of diagnosis, planning of further management, and coordination of care.  MEDICATIONS: Scheduled Meds: . acetaminophen  1,000 mg Oral 4 times per day   Or  . acetaminophen (TYLENOL) oral liquid 160 mg/5 mL  1,000 mg Oral 4 times per day  . bisacodyl  10 mg Oral Daily  . buPROPion  200 mg Oral BID  . enoxaparin (LOVENOX) injection  50 mg Subcutaneous Q24H  . fentaNYL   Intravenous 6 times per day  . gabapentin  600 mg Oral TID  . levalbuterol  0.63 mg Nebulization TID  . levothyroxine  112 mcg Oral QAC breakfast  . methylPREDNISolone (SOLU-MEDROL)  injection  40 mg Intravenous Q12H  . metoCLOPramide (REGLAN) injection  10 mg Intravenous 4 times per day  . morphine  60 mg Oral Q12H  . piperacillin-tazobactam (ZOSYN)  IV  3.375 g Intravenous Q8H  . senna-docusate  1 tablet Oral QHS  . senna-docusate  2 tablet Oral QHS  . sertraline  100 mg Oral Daily  . vancomycin  1,000 mg Intravenous Q12H   Continuous Infusions: . sodium chloride 10 mL/hr at 09/25/15 0905   PRN Meds:.albuterol, bisacodyl, diazepam, diphenhydrAMINE **OR** diphenhydrAMINE, methocarbamol, morphine, naloxone **AND** sodium chloride, ondansetron (ZOFRAN) IV, ondansetron **OR** ondansetron  (ZOFRAN) IV, potassium chloride, sodium chloride, traMADol, traZODone    PHYSICAL EXAM: Vital signs in last 24 hours: Filed Vitals:   09/25/15 0436 09/25/15 0739 09/25/15 0811 09/25/15 0902  BP:   138/87   Pulse:   75   Temp:  97.9 F (36.6 C)    TempSrc:  Oral    Resp: 26 18 17    Height:      Weight:      SpO2: 95% 95% 95% 96%    Weight change:  Filed Weights   09/19/15 0106 09/19/15 0638  Weight: 108.863 kg (240 lb) 108.228 kg (238 lb 9.6 oz)   Body mass index is 38.53 kg/(m^2).   Gen Exam: Slightly drowsy/sleepy this am-but awake and alert with clear speech.   Neck: Supple, No JVD.   Chest: B/L Clear anteriorly CVS: S1 S2 Regular, no murmurs.  Abdomen: soft, BS +, non tender, non distended.  Extremities: no edema, lower extremities warm to touch. Neurologic: Non Focal.   Skin: No Rash.   Wounds: N/A.   Intake/Output from previous day:  Intake/Output Summary (Last 24 hours) at 09/25/15 1041 Last data filed at 09/25/15 0700  Gross per 24 hour  Intake   3350 ml  Output   4085 ml  Net   -735 ml     LAB RESULTS: CBC  Recent Labs Lab 09/19/15 0200 09/20/15 0613 09/21/15 0621 09/23/15 1600 09/24/15 0509 09/24/15 1045 09/25/15 0430  WBC 13.7* 21.1* 20.8* 11.1* 12.7*  --  14.1*  HGB 14.3 13.2 12.5 11.8* 12.1 13.6 12.4  HCT 43.1 41.3 39.1 37.8 38.6 40.0 40.2  PLT 355 361 338 296 328  --  384  MCV 95.1 97.4 97.0 96.9 96.5  --  96.9  MCH 31.6 31.1 31.0 30.3 30.3  --  29.9  MCHC 33.2 32.0 32.0 31.2 31.3  --  30.8  RDW 12.0 12.5 12.3 12.5 12.5  --  12.7  LYMPHSABS 1.3  --   --   --   --   --   --   MONOABS 0.6  --   --   --   --   --   --   EOSABS 0.0  --   --   --   --   --   --   BASOSABS 0.0  --   --   --   --   --   --     Chemistries   Recent Labs Lab 09/19/15 0200 09/20/15 0613 09/23/15 1600 09/24/15 1045 09/25/15 0430  NA 142 138 142 142 143  K 4.1 3.6 3.3* 3.8 3.5  CL 102 101 101  --  102  CO2 30 28 30   --  31  GLUCOSE 131* 137* 209*   --  125*  BUN 13 10 11   --  7  CREATININE 0.77 0.70 0.79  --  0.54  CALCIUM  9.3 8.4* 8.7*  --  8.6*    CBG: No results for input(s): GLUCAP in the last 168 hours.  GFR Estimated Creatinine Clearance: 100.1 mL/min (by C-G formula based on Cr of 0.54).  Coagulation profile  Recent Labs Lab 09/24/15 0509  INR 1.33    Cardiac Enzymes  Recent Labs Lab 09/19/15 0521 09/19/15 1051 09/19/15 1654  TROPONINI <0.03 <0.03 <0.03    Invalid input(s): POCBNP No results for input(s): DDIMER in the last 72 hours. No results for input(s): HGBA1C in the last 72 hours. No results for input(s): CHOL, HDL, LDLCALC, TRIG, CHOLHDL, LDLDIRECT in the last 72 hours. No results for input(s): TSH, T4TOTAL, T3FREE, THYROIDAB in the last 72 hours.  Invalid input(s): FREET3 No results for input(s): VITAMINB12, FOLATE, FERRITIN, TIBC, IRON, RETICCTPCT in the last 72 hours. No results for input(s): LIPASE, AMYLASE in the last 72 hours.  Urine Studies No results for input(s): UHGB, CRYS in the last 72 hours.  Invalid input(s): UACOL, UAPR, USPG, UPH, UTP, UGL, UKET, UBIL, UNIT, UROB, ULEU, UEPI, UWBC, URBC, UBAC, CAST, UCOM, BILUA  MICROBIOLOGY: Recent Results (from the past 240 hour(s))  Culture, body fluid-bottle     Status: None (Preliminary result)   Collection Time: 09/22/15  2:21 PM  Result Value Ref Range Status   Specimen Description FLUID PLEURAL  Final   Special Requests BOTTLES DRAWN AEROBIC AND ANAEROBIC 6CC  Final   Culture PENDING  Incomplete   Report Status PENDING  Incomplete  Gram stain     Status: None (Preliminary result)   Collection Time: 09/22/15  2:25 PM  Result Value Ref Range Status   Specimen Description FLUID  Final   Special Requests NONE  Final   Gram Stain NO ORGANISMS SEEN PERFORMED AT [facility]   Final   Report Status PENDING  Incomplete  Surgical pcr screen     Status: None   Collection Time: 09/24/15 12:15 AM  Result Value Ref Range Status   MRSA, PCR  NEGATIVE NEGATIVE Final   Staphylococcus aureus NEGATIVE NEGATIVE Final    Comment:        The Xpert SA Assay (FDA approved for NASAL specimens in patients over 36 years of age), is one component of a comprehensive surveillance program.  Test performance has been validated by [facility] for patients greater than or equal to 48 year old. It is not intended to diagnose infection nor to guide or monitor treatment.   Culture, respiratory (NON-Expectorated)     Status: None (Preliminary result)   Collection Time: 09/24/15  9:00 AM  Result Value Ref Range Status   Specimen Description BRONCHIAL WASHINGS RIGHT  Final   Special Requests NONE  Final   Gram Stain   Final    FEW WBC PRESENT, PREDOMINANTLY PMN NO SQUAMOUS EPITHELIAL CELLS SEEN NO ORGANISMS SEEN Performed at [facility]    Culture PENDING  Incomplete   Report Status PENDING  Incomplete  Culture, body fluid-bottle     Status: None (Preliminary result)   Collection Time: 09/24/15  9:43 AM  Result Value Ref Range Status   Specimen Description FLUID RIGHT PLEURAL  Final   Special Requests   Final    POF ZOSYN PART B BOTTLES DRAWN AEROBIC AND ANAEROBIC 10MLS   Culture PENDING  Incomplete   Report Status PENDING  Incomplete  Gram stain     Status: None   Collection Time: 09/24/15  9:43 AM  Result Value Ref Range Status   Specimen Description FLUID  RIGHT PLEURAL  Final   Special Requests POF ZOSYN PART B  Final   Gram Stain   Final    RARE WBC PRESENT,BOTH PMN AND MONONUCLEAR NO ORGANISMS SEEN    Report Status 09/24/2015 FINAL  Final  Culture, body fluid-bottle     Status: None (Preliminary result)   Collection Time: 09/24/15  9:50 AM  Result Value Ref Range Status   Specimen Description FLUID RIGHT PLEURAL  Final   Special Requests POF ZOSYN PART C BOTTLES DRAWN AEROBIC ONLY 5MLS  Final   Culture PENDING  Incomplete   Report Status PENDING  Incomplete  Gram stain     Status: None   Collection Time:  09/24/15  9:50 AM  Result Value Ref Range Status   Specimen Description FLUID RIGHT PLEURAL  Final   Special Requests POF ZOSYN PART C  Final   Gram Stain   Final    FEW WBC PRESENT,BOTH PMN AND MONONUCLEAR NO ORGANISMS SEEN    Report Status 09/24/2015 FINAL  Final  Tissue culture     Status: None (Preliminary result)   Collection Time: 09/24/15  9:58 AM  Result Value Ref Range Status   Specimen Description TISSUE RIGHT LUNG  Final   Special Requests POF ZOSYN  Final   Gram Stain   Final    ABUNDANT WBC PRESENT,BOTH PMN AND MONONUCLEAR NO ORGANISMS SEEN Performed at [facility]    Culture PENDING  Incomplete   Report Status PENDING  Incomplete  Tissue culture     Status: None (Preliminary result)   Collection Time: 09/24/15 10:09 AM  Result Value Ref Range Status   Specimen Description TISSUE RIGHT LUNG  Final   Special Requests RIGHT VISCERAL PEEL POF ZOSYN  Final   Gram Stain   Final    RARE WBC PRESENT, PREDOMINANTLY PMN NO ORGANISMS SEEN Performed at [facility]    Culture PENDING  Incomplete   Report Status PENDING  Incomplete  Tissue culture     Status: None (Preliminary result)   Collection Time: 09/24/15 10:43 AM  Result Value Ref Range Status   Specimen Description TISSUE RIGHT LUNG  Final   Special Requests RIGHT PERIATAL PLEURA POF ZOSYN  Final   Gram Stain   Final    MODERATE WBC PRESENT,BOTH PMN AND MONONUCLEAR NO ORGANISMS SEEN Performed at [facility]    Culture PENDING  Incomplete   Report Status PENDING  Incomplete    RADIOLOGY STUDIES/RESULTS: Dg Chest 1 View  09/22/2015  CLINICAL DATA:  Loculated RIGHT pleural effusion post thoracentesis EXAM: CHEST 1 VIEW COMPARISON:  CT chest 09/21/2015, chest radiograph 09/19/2015 FINDINGS: Enlargement of cardiac silhouette with pulmonary vascular congestion. Diffuse infiltrates question pulmonary edema. Moderate to large RIGHT pleural effusion, partially loculated by prior CT. No apical  pneumothorax identified. Persistent atelectasis of RIGHT middle and RIGHT lower lobes as well as portion of RIGHT upper lobe. Minimal LEFT basilar atelectasis. Several foci of gas at the mid RIGHT lung and at the RIGHT base are identified, question aerated lung versus potentially small foci of loculated pneumothorax post thoracentesis. Minimal degenerative disc disease changes thoracic spine. IMPRESSION: Pulmonary edema. Persistent moderate to large loculated RIGHT pleural effusion with extensive atelectasis of the mid to lower RIGHT lung. Several foci of gas noted in the RIGHT mid lung and at the lower RIGHT chest question foci of aerated lung versus small foci of loculated pneumothorax post RIGHT thoracentesis. Findings called to Dr. Luan Pulling on 09/22/2015 at 1430 hours. Electronically  Signed   By: Lavonia Dana M.D.   On: 09/22/2015 14:33   Ct Angio Chest Pe W/cm &/or Wo Cm  09/21/2015  CLINICAL DATA:  Persistent hypoxia. Decreased oxygen saturation with increasing oxygen requirement. EXAM: CT ANGIOGRAPHY CHEST WITH CONTRAST TECHNIQUE: Multidetector CT imaging of the chest was performed using the standard protocol during bolus administration of intravenous contrast. Multiplanar CT image reconstructions and MIPs were obtained to evaluate the vascular anatomy. CONTRAST:  153mL OMNIPAQUE IOHEXOL 350 MG/ML SOLN COMPARISON:  CT PE protocol 09/19/2015, 2 days prior FINDINGS: There are no filling defects within the pulmonary arteries to suggest pulmonary embolus. Limited assessment of the lower lobe branches secondary to breathing motion artifact and consolidation on the right. Progressive consolidation and pleural effusion in the right hemithorax. Marked increasing in right pleural effusion, now moderate to large in circumferential. Consolidation in the right lower lobe concerning for pneumonia, with heterogeneous enhancement posteriorly. Small focus of air in the periphery of the heterogeneity and may reflect  developing abscess. Complete atelectasis or consolidation throughout the right middle lobe. Compressive atelectasis in the right upper lobe, with minimal ground-glass opacity in the aerated right upper lung. Scattered atelectasis and ground-glass opacities in the left lung. No left pleural effusion. Thoracic aorta is normal in caliber. There is no pericardial effusion. Development of mediastinal adenopathy in the interim is likely reactive. Previous prominent right hilar lymph node is obscured. Evaluation of the upper abdomen demonstrates no acute abnormality. There are no acute or suspicious osseous abnormalities. Review of the MIP images confirms the above findings. IMPRESSION: 1. No pulmonary embolus. 2. Marked progression in right pleural effusion, now moderate to large and circumferential. Consolidation in the right lower lobe with heterogeneity consistent with pneumonia, small focus of air in the periphery of the right lower lobe may reflect developing abscess. Complete consolidation throughout the right middle lobe, additional pneumonia versus compressive atelectasis. 3. Development of mediastinal adenopathy, likely reactive. Electronically Signed   By: Jeb Levering M.D.   On: 09/21/2015 21:34   Ct Angio Chest Pe W/cm &/or Wo Cm  09/19/2015  CLINICAL DATA:  Awoke with right-sided chest pain. Elevated D-dimer. EXAM: CT ANGIOGRAPHY CHEST WITH CONTRAST TECHNIQUE: Multidetector CT imaging of the chest was performed using the standard protocol during bolus administration of intravenous contrast. Multiplanar CT image reconstructions and MIPs were obtained to evaluate the vascular anatomy. CONTRAST:  168mL OMNIPAQUE IOHEXOL 350 MG/ML SOLN COMPARISON:  Radiograph earlier this day. FINDINGS: No filling defects in the central pulmonary arteries. Breathing motion artifact, soft tissue attenuation and contrast bolus timing partially limits evaluation of the distal branches. Thoracic aorta is normal in caliber. No  evidence of dissection. No pericardial effusion. Prominent right hilar lymph node measures 10 mm short axis. No mediastinal adenopathy. Dense consolidation in the right lower lobe with air bronchograms consistent with pneumonia. Small adjacent right pleural effusion. Scattered linear opacities throughout both lungs consistent with atelectasis. No left pleural effusion. Breathing motion artifact partially obscures detailed evaluation. Evaluation of the upper abdomen demonstrates no acute abnormalities. There are no acute or suspicious osseous abnormalities. Motion artifact through the sternum. Postsurgical change in the cervical spine, partially included. Review of the MIP images confirms the above findings. IMPRESSION: 1. No central pulmonary embolus. 2. Dense right lower lobe consolidation consistent with pneumonia. Small right pleural effusion. Followup PA and lateral chest X-ray is recommended in 3-4 weeks following trial of antibiotic therapy to ensure resolution and exclude underlying malignancy. 3. Scattered atelectasis throughout both lungs. Breathing  motion artifact partially obscures detailed evaluation. Electronically Signed   By: Jeb Levering M.D.   On: 09/19/2015 03:31   Dg Chest Port 1 View  09/25/2015  CLINICAL DATA:  Respiratory failure, pneumonia, empyema with chest tube drainage EXAM: PORTABLE CHEST 1 VIEW COMPARISON:  Portable chest x-ray of January and CT scan of the chest of September 21, 2015. FINDINGS: The less than 5% right apical pneumothorax remains faintly visible. There is persistent increased density peripherally in the right mid lung and at the right lung base. The 2 chest tubes are in stable position. The left lung is adequately inflated. There is minimal basilar atelectasis and trace of pleural fluid on the left. The cardiac silhouette is top-normal in size. The pulmonary vascularity is not engorged. The bony thorax exhibits no acute abnormality. IMPRESSION: There has not been  dramatic interval change since the study of September 24, 2015. Slight improvement in pulmonary interstitial edema on the right has occurred. There is persistent less than 5% right apical pneumothorax. The support tubes are in reasonable position. Electronically Signed   By: David  Martinique M.D.   On: 09/25/2015 08:00   Dg Chest Port 1 View  09/24/2015  CLINICAL DATA:  Right empyema. Community acquired pneumonia. Acute respiratory failure with hypoxia. Status post VATS. EXAM: PORTABLE CHEST 1 VIEW COMPARISON:  09/24/2015 FINDINGS: Right chest tubes and right arm PICC line remain in place. A tiny less than 5% right apical pneumothorax remains stable. Small right pleural effusion and right lower lung infiltrate or atelectasis is unchanged. Mild atelectasis in left lung base is also stable. IMPRESSION: Stable tiny less than 5% right apical pneumothorax. Right greater than left lower lung infiltrate or atelectasis and small pleural effusions without significant change. Electronically Signed   By: Earle Gell M.D.   On: 09/24/2015 17:17   Dg Chest Port 1 View  09/24/2015  CLINICAL DATA:  Status post right lung surgery with chest tube placement. EXAM: PORTABLE CHEST 1 VIEW COMPARISON:  Chest x-ray 05/01/2016 FINDINGS: Status post evacuation of a large complex right pleural effusion with 2 chest tubes in place. Tiny residual pleural effusion and small apical pneumothorax. Right lung demonstrates mild edema and atelectasis. The left lung is stable. Minimal left basilar atelectasis. A right PICC line is in good position with its tip in the distal SVC near the cavoatrial junction. IMPRESSION: Status post evacuation of a large complex right pleural effusion with 2 chest tubes in place. There is a small apical pneumothorax. Mild edema and atelectasis. Electronically Signed   By: Marijo Sanes M.D.   On: 09/24/2015 13:43   Dg Chest Portable 1 View  09/19/2015  CLINICAL DATA:  Acute onset of stabbing right rib cage pain.  Initial encounter. EXAM: PORTABLE CHEST 1 VIEW COMPARISON:  Chest radiograph performed 04/08/2009 FINDINGS: The lungs are mildly hypoexpanded. Bibasilar airspace opacities, right greater than left, may reflect pneumonia or pulmonary edema. Underlying vascular congestion is noted. A small right pleural effusion is suspected. No pneumothorax is seen. The cardiomediastinal silhouette is borderline normal in size. No acute osseous abnormalities are seen. IMPRESSION: Lungs mildly hypoexpanded. Vascular congestion noted. Bibasilar airspace opacities, right greater than left, may reflect pneumonia or pulmonary edema. Suspect small right pleural effusion. Electronically Signed   By: Garald Balding M.D.   On: 09/19/2015 02:16   US Thoracentesis Asp Pleural Space W/img Guide  09/22/2015  CLINICAL DATA:  RIGHT pleural effusion EXAM: ULTRASOUND GUIDED DIAGNOSTIC AND THERAPEUTIC THORACENTESIS COMPARISON:  CT chest 09/21/2015 PROCEDURE: Procedure,  benefits, and risks of procedure were discussed with patient. Written informed consent for procedure was obtained. Time out protocol followed. Pleural effusion localized by ultrasound at the posterior RIGHT hemithorax. Pleural effusion is complex, containing multiple scattered areas of internal echogenicity with multiple loculations/septations. Skin prepped and draped in usual sterile fashion. Skin and soft tissues anesthetized with 10 mL of 1% lidocaine. 8 French thoracentesis catheter placed into the RIGHT pleural space. 33 mL of serosanguineous fluid aspirated by syringe pump. Procedure tolerated well by patient without immediate complication. COMPLICATIONS: No immediate complications ; post procedural chest radiograph reported separately. FINDINGS: A total of approximately 33 mL of RIGHT pleural fluid was removed. Entire specimen was sent for requested laboratory analysis. IMPRESSION: Successful ultrasound guided RIGHT thoracentesis yielding 33 mL of pleural fluid. Sonographically  the RIGHT pleural effusion appears complex and multiloculated. Findings called to Dr.  Luan Pulling on 09/22/2015 at 1430 hours. Electronically Signed   By: Lavonia Dana M.D.   On: 09/22/2015 14:38    Oren Binet, MD  [facility] Pager:[phone]  If 7PM-7AM, please contact night-coverage [email or web address] Password [email or web address] 09/25/2015, 10:41 AM   LOS: 6 days

## 2015-09-25 NOTE — Progress Notes (Signed)
UR COMPLETED  

## 2015-09-26 ENCOUNTER — Inpatient Hospital Stay (HOSPITAL_COMMUNITY): Payer: PPO

## 2015-09-26 DIAGNOSIS — E039 Hypothyroidism, unspecified: Secondary | ICD-10-CM | POA: Diagnosis not present

## 2015-09-26 DIAGNOSIS — G894 Chronic pain syndrome: Secondary | ICD-10-CM | POA: Diagnosis not present

## 2015-09-26 DIAGNOSIS — J9601 Acute respiratory failure with hypoxia: Secondary | ICD-10-CM | POA: Diagnosis not present

## 2015-09-26 DIAGNOSIS — J189 Pneumonia, unspecified organism: Secondary | ICD-10-CM | POA: Diagnosis not present

## 2015-09-26 DIAGNOSIS — F418 Other specified anxiety disorders: Secondary | ICD-10-CM | POA: Diagnosis not present

## 2015-09-26 DIAGNOSIS — R918 Other nonspecific abnormal finding of lung field: Secondary | ICD-10-CM | POA: Diagnosis not present

## 2015-09-26 DIAGNOSIS — J869 Pyothorax without fistula: Secondary | ICD-10-CM | POA: Diagnosis not present

## 2015-09-26 LAB — COMPREHENSIVE METABOLIC PANEL
ALT: 22 U/L (ref 14–54)
AST: 13 U/L — ABNORMAL LOW (ref 15–41)
Albumin: 2.3 g/dL — ABNORMAL LOW (ref 3.5–5.0)
Alkaline Phosphatase: 62 U/L (ref 38–126)
Anion gap: 11 (ref 5–15)
BUN: 7 mg/dL (ref 6–20)
CO2: 30 mmol/L (ref 22–32)
Calcium: 8.6 mg/dL — ABNORMAL LOW (ref 8.9–10.3)
Chloride: 102 mmol/L (ref 101–111)
Creatinine, Ser: 0.62 mg/dL (ref 0.44–1.00)
GFR calc Af Amer: >60 mL/min (ref >60)
GFR calc non Af Amer: >60 mL/min (ref >60)
Glucose, Bld: 96 mg/dL (ref 65–99)
Potassium: 2.9 mmol/L — ABNORMAL LOW (ref 3.5–5.1)
Sodium: 143 mmol/L (ref 135–145)
Total Bilirubin: 0.5 mg/dL (ref 0.3–1.2)
Total Protein: 5.7 g/dL — ABNORMAL LOW (ref 6.5–8.1)

## 2015-09-26 LAB — CBC
HCT: 38.6 % (ref 36.0–46.0)
Hemoglobin: 12 g/dL (ref 12.0–15.0)
MCH: 30.2 pg (ref 26.0–34.0)
MCHC: 31.1 g/dL (ref 30.0–36.0)
MCV: 97.2 fL (ref 78.0–100.0)
Platelets: 352 10*3/uL (ref 150–400)
RBC: 3.97 MIL/uL (ref 3.87–5.11)
RDW: 12.7 % (ref 11.5–15.5)
WBC: 12.3 10*3/uL — ABNORMAL HIGH (ref 4.0–10.5)

## 2015-09-26 LAB — CULTURE, RESPIRATORY W GRAM STAIN: Culture: NO GROWTH

## 2015-09-26 MED ORDER — PREDNISONE 20 MG PO TABS
30.0000 mg | ORAL_TABLET | Freq: Every day | ORAL | Status: DC
  Administered 2015-09-27 – 2015-09-29 (×3): 30 mg via ORAL
  Filled 2015-09-26 (×3): qty 1

## 2015-09-26 MED ORDER — POTASSIUM CHLORIDE 10 MEQ/50ML IV SOLN
10.0000 meq | INTRAVENOUS | Status: AC
  Administered 2015-09-26 (×3): 10 meq via INTRAVENOUS
  Filled 2015-09-26 (×3): qty 50

## 2015-09-26 MED ORDER — POTASSIUM CHLORIDE CRYS ER 20 MEQ PO TBCR
20.0000 meq | EXTENDED_RELEASE_TABLET | Freq: Two times a day (BID) | ORAL | Status: DC
  Administered 2015-09-26 – 2015-09-29 (×7): 20 meq via ORAL
  Filled 2015-09-26 (×7): qty 1

## 2015-09-26 NOTE — Progress Notes (Signed)
PATIENT DETAILS Name: [name] [name] Age: 55 y.o. Sex: female Date of Birth: 07-18-1961 Admit Date: 09/19/2015 Admitting Physician Orvan Falconer, MD [id number], CAMMIE, MD  Subjective: Still complains of pain-but better than yesterday.  Assessment/Plan: Principal Problem: Acute Hypoxic Resp Failure:secondary to PNA and large loculated pleural effusion. Underwent VATS on 1/11. Titrate off O2  Active Problems: CAP with empyema:underwent VATS, drainage of empyema and placement of chest tube. Cultures negative so far, discontinue vancomycin-continue Zosyn.CTVS following. Pain continues to be a significant issue-though better than yesterday-on PCA Fentanylin addition to her usual narcotics. Post op care managed by [facility]  Hypothyroidism:continue Synthroid  Chronic Pain Syndrome/Fibromyalgia:continue narcotics  Anxiety/Depression:stable, continue Zoloft and Wellbutrin  Obesity:counseled regarding importance of weight loss.   Disposition: Remain inpatient-remain in SDU  Antimicrobial agents  See below  Anti-infectives    Start     Dose/Rate Route Frequency Ordered Stop   09/22/15 1400  vancomycin (VANCOCIN) IVPB 1000 mg/200 mL premix  Status:  Discontinued     1,000 mg 200 mL/hr over 60 Minutes Intravenous Every 12 hours 09/22/15 0728 09/26/15 0758   09/22/15 0200  piperacillin-tazobactam (ZOSYN) IVPB 3.375 g     3.375 g 12.5 mL/hr over 240 Minutes Intravenous Every 8 hours 09/22/15 0132     09/22/15 0200  vancomycin (VANCOCIN) IVPB 1000 mg/200 mL premix     1,000 mg 200 mL/hr over 60 Minutes Intravenous Every 1 hr x 2 09/22/15 0132 09/22/15 0400   09/22/15 0130  levofloxacin (LEVAQUIN) IVPB 750 mg  Status:  Discontinued     750 mg 100 mL/hr over 90 Minutes Intravenous Every 24 hours 09/22/15 0125 09/23/15 1432   09/20/15 0400  azithromycin (ZITHROMAX) 500 mg in dextrose 5 % 250 mL IVPB  Status:  Discontinued     500 mg 250 mL/hr over 60 Minutes Intravenous Every  24 hours 09/19/15 0733 09/22/15 0125   09/19/15 1600  cefTRIAXone (ROCEPHIN) 1 g in dextrose 5 % 50 mL IVPB  Status:  Discontinued     1 g 100 mL/hr over 30 Minutes Intravenous Every 24 hours 09/19/15 0733 09/22/15 0125   09/19/15 0400  cefTRIAXone (ROCEPHIN) 1 g in dextrose 5 % 50 mL IVPB     1 g 100 mL/hr over 30 Minutes Intravenous  Once 09/19/15 0349 09/19/15 0433   09/19/15 0400  azithromycin (ZITHROMAX) 500 mg in dextrose 5 % 250 mL IVPB  Status:  Discontinued     500 mg 250 mL/hr over 60 Minutes Intravenous Every 24 hours 09/19/15 0349 09/19/15 1306      DVT Prophylaxis: Prophylactic Lovenox   Code Status: Full code   Family Communication None at bedsdie  Procedures: VATS >>1/11  CONSULTS:  [facility]  Time spent 30 minutes-Greater than 50% of this time was spent in counseling, explanation of diagnosis, planning of further management, and coordination of care.  MEDICATIONS: Scheduled Meds: . acetaminophen  1,000 mg Oral 4 times per day   Or  . acetaminophen (TYLENOL) oral liquid 160 mg/5 mL  1,000 mg Oral 4 times per day  . bisacodyl  10 mg Oral Daily  . buPROPion  200 mg Oral BID  . enoxaparin (LOVENOX) injection  50 mg Subcutaneous Q24H  . fentaNYL   Intravenous 6 times per day  . gabapentin  600 mg Oral TID  . levalbuterol  0.63 mg Nebulization BID  . levothyroxine  112 mcg Oral QAC breakfast  .  metoCLOPramide (REGLAN) injection  10 mg Intravenous 4 times per day  . morphine  60 mg Oral Q12H  . piperacillin-tazobactam (ZOSYN)  IV  3.375 g Intravenous Q8H  . potassium chloride  20 mEq Oral BID  . predniSONE  40 mg Oral Q breakfast  . senna-docusate  1 tablet Oral QHS  . senna-docusate  2 tablet Oral QHS  . sertraline  100 mg Oral Daily   Continuous Infusions: . sodium chloride 10 mL/hr at 09/25/15 0905   PRN Meds:.albuterol, bisacodyl, diazepam, diphenhydrAMINE **OR** diphenhydrAMINE, methocarbamol, morphine, naloxone **AND** sodium chloride, ondansetron  (ZOFRAN) IV, ondansetron **OR** ondansetron (ZOFRAN) IV, sodium chloride, traZODone    PHYSICAL EXAM: Vital signs in last 24 hours: Filed Vitals:   09/26/15 0728 09/26/15 0735 09/26/15 0933 09/26/15 1146  BP: 121/75     Pulse: 76     Temp: 97.9 F (36.6 C)     TempSrc: Oral     Resp: 16 18  16   Height:      Weight:      SpO2: 93% 96% 94% 92%    Weight change:  Filed Weights   09/19/15 0106 09/19/15 0638  Weight: 108.863 kg (240 lb) 108.228 kg (238 lb 9.6 oz)   Body mass index is 38.53 kg/(m^2).   Gen Exam: Awake and alert with clear speech.   Neck: Supple, No JVD.   Chest: B/L Clear.   CVS: S1 S2 Regular, no murmurs.  Abdomen: soft, BS +, non tender, non distended.  Extremities: no edema, lower extremities warm to touch. Neurologic: Non Focal.   Skin: No Rash.   Wounds: N/A.   Intake/Output from previous day:  Intake/Output Summary (Last 24 hours) at 09/26/15 1318 Last data filed at 09/26/15 0700  Gross per 24 hour  Intake   1150 ml  Output   1450 ml  Net   -300 ml     LAB RESULTS: CBC  Recent Labs Lab 09/21/15 0621 09/23/15 1600 09/24/15 0509 09/24/15 1045 09/25/15 0430 09/26/15 0400  WBC 20.8* 11.1* 12.7*  --  14.1* 12.3*  HGB 12.5 11.8* 12.1 13.6 12.4 12.0  HCT 39.1 37.8 38.6 40.0 40.2 38.6  PLT 338 296 328  --  384 352  MCV 97.0 96.9 96.5  --  96.9 97.2  MCH 31.0 30.3 30.3  --  29.9 30.2  MCHC 32.0 31.2 31.3  --  30.8 31.1  RDW 12.3 12.5 12.5  --  12.7 12.7    Chemistries   Recent Labs Lab 09/20/15 0613 09/23/15 1600 09/24/15 1045 09/25/15 0430 09/26/15 0400  NA 138 142 142 143 143  K 3.6 3.3* 3.8 3.5 2.9*  CL 101 101  --  102 102  CO2 28 30  --  31 30  GLUCOSE 137* 209*  --  125* 96  BUN 10 11  --  7 7  CREATININE 0.70 0.79  --  0.54 0.62  CALCIUM 8.4* 8.7*  --  8.6* 8.6*    CBG: No results for input(s): GLUCAP in the last 168 hours.  GFR Estimated Creatinine Clearance: 100.1 mL/min (by C-G formula based on Cr of  0.62).  Coagulation profile  Recent Labs Lab 09/24/15 0509  INR 1.33    Cardiac Enzymes  Recent Labs Lab 09/19/15 1654  TROPONINI <0.03    Invalid input(s): POCBNP No results for input(s): DDIMER in the last 72 hours. No results for input(s): HGBA1C in the last 72 hours. No results for input(s): CHOL, HDL, LDLCALC, TRIG, CHOLHDL, LDLDIRECT  in the last 72 hours. No results for input(s): TSH, T4TOTAL, T3FREE, THYROIDAB in the last 72 hours.  Invalid input(s): FREET3 No results for input(s): VITAMINB12, FOLATE, FERRITIN, TIBC, IRON, RETICCTPCT in the last 72 hours. No results for input(s): LIPASE, AMYLASE in the last 72 hours.  Urine Studies No results for input(s): UHGB, CRYS in the last 72 hours.  Invalid input(s): UACOL, UAPR, USPG, UPH, UTP, UGL, UKET, UBIL, UNIT, UROB, ULEU, UEPI, UWBC, URBC, UBAC, CAST, UCOM, BILUA  MICROBIOLOGY: Recent Results (from the past 240 hour(s))  Culture, body fluid-bottle     Status: None (Preliminary result)   Collection Time: 09/22/15  2:21 PM  Result Value Ref Range Status   Specimen Description FLUID PLEURAL  Final   Special Requests BOTTLES DRAWN AEROBIC AND ANAEROBIC 6CC  Final   Culture NO GROWTH 4 DAYS  Final   Report Status PENDING  Incomplete  Gram stain     Status: None (Preliminary result)   Collection Time: 09/22/15  2:25 PM  Result Value Ref Range Status   Specimen Description FLUID  Final   Special Requests NONE  Final   Gram Stain NO ORGANISMS SEEN PERFORMED AT [facility]   Final   Report Status PENDING  Incomplete  Surgical pcr screen     Status: None   Collection Time: 09/24/15 12:15 AM  Result Value Ref Range Status   MRSA, PCR NEGATIVE NEGATIVE Final   Staphylococcus aureus NEGATIVE NEGATIVE Final    Comment:        The Xpert SA Assay (FDA approved for NASAL specimens in patients over 15 years of age), is one component of a comprehensive surveillance program.  Test performance has been validated by [facility]  for patients greater than or equal to 10 year old. It is not intended to diagnose infection nor to guide or monitor treatment.   Culture, respiratory (NON-Expectorated)     Status: None   Collection Time: 09/24/15  9:00 AM  Result Value Ref Range Status   Specimen Description BRONCHIAL WASHINGS RIGHT  Final   Special Requests NONE  Final   Gram Stain   Final    FEW WBC PRESENT, PREDOMINANTLY PMN NO SQUAMOUS EPITHELIAL CELLS SEEN NO ORGANISMS SEEN Performed at [facility]    Culture   Final    NO GROWTH 2 DAYS Performed at [facility]    Report Status 09/26/2015 FINAL  Final  Culture, body fluid-bottle     Status: None (Preliminary result)   Collection Time: 09/24/15  9:43 AM  Result Value Ref Range Status   Specimen Description FLUID RIGHT PLEURAL  Final   Special Requests   Final    POF ZOSYN PART B BOTTLES DRAWN AEROBIC AND ANAEROBIC 10MLS   Culture NO GROWTH 1 DAY  Final   Report Status PENDING  Incomplete  Gram stain     Status: None   Collection Time: 09/24/15  9:43 AM  Result Value Ref Range Status   Specimen Description FLUID RIGHT PLEURAL  Final   Special Requests POF ZOSYN PART B  Final   Gram Stain   Final    RARE WBC PRESENT,BOTH PMN AND MONONUCLEAR NO ORGANISMS SEEN    Report Status 09/24/2015 FINAL  Final  Culture, body fluid-bottle     Status: None (Preliminary result)   Collection Time: 09/24/15  9:50 AM  Result Value Ref Range Status   Specimen Description FLUID RIGHT PLEURAL  Final   Special Requests POF ZOSYN PART C BOTTLES  DRAWN AEROBIC ONLY 5MLS  Final   Culture NO GROWTH 1 DAY  Final   Report Status PENDING  Incomplete  Gram stain     Status: None   Collection Time: 09/24/15  9:50 AM  Result Value Ref Range Status   Specimen Description FLUID RIGHT PLEURAL  Final   Special Requests POF ZOSYN PART C  Final   Gram Stain   Final    FEW WBC PRESENT,BOTH PMN AND MONONUCLEAR NO ORGANISMS SEEN    Report Status 09/24/2015 FINAL   Final  Tissue culture     Status: None (Preliminary result)   Collection Time: 09/24/15  9:58 AM  Result Value Ref Range Status   Specimen Description TISSUE RIGHT LUNG  Final   Special Requests POF ZOSYN  Final   Gram Stain   Final    ABUNDANT WBC PRESENT,BOTH PMN AND MONONUCLEAR NO ORGANISMS SEEN Performed at [facility]    Culture   Final    NO GROWTH 2 DAYS Performed at [facility]    Report Status PENDING  Incomplete  Tissue culture     Status: None (Preliminary result)   Collection Time: 09/24/15 10:09 AM  Result Value Ref Range Status   Specimen Description TISSUE RIGHT LUNG  Final   Special Requests RIGHT VISCERAL PEEL POF ZOSYN  Final   Gram Stain   Final    RARE WBC PRESENT, PREDOMINANTLY PMN NO ORGANISMS SEEN Performed at [facility]    Culture   Final    NO GROWTH 2 DAYS Performed at [facility]    Report Status PENDING  Incomplete  Tissue culture     Status: None (Preliminary result)   Collection Time: 09/24/15 10:43 AM  Result Value Ref Range Status   Specimen Description TISSUE RIGHT LUNG  Final   Special Requests RIGHT PERIATAL PLEURA POF ZOSYN  Final   Gram Stain   Final    MODERATE WBC PRESENT,BOTH PMN AND MONONUCLEAR NO ORGANISMS SEEN Performed at [facility]    Culture   Final    NO GROWTH 2 DAYS Performed at [facility]    Report Status PENDING  Incomplete    RADIOLOGY STUDIES/RESULTS: Dg Chest 1 View  09/22/2015  CLINICAL DATA:  Loculated RIGHT pleural effusion post thoracentesis EXAM: CHEST 1 VIEW COMPARISON:  CT chest 09/21/2015, chest radiograph 09/19/2015 FINDINGS: Enlargement of cardiac silhouette with pulmonary vascular congestion. Diffuse infiltrates question pulmonary edema. Moderate to large RIGHT pleural effusion, partially loculated by prior CT. No apical pneumothorax identified. Persistent atelectasis of RIGHT middle and RIGHT lower lobes as well as portion of RIGHT upper lobe.  Minimal LEFT basilar atelectasis. Several foci of gas at the mid RIGHT lung and at the RIGHT base are identified, question aerated lung versus potentially small foci of loculated pneumothorax post thoracentesis. Minimal degenerative disc disease changes thoracic spine. IMPRESSION: Pulmonary edema. Persistent moderate to large loculated RIGHT pleural effusion with extensive atelectasis of the mid to lower RIGHT lung. Several foci of gas noted in the RIGHT mid lung and at the lower RIGHT chest question foci of aerated lung versus small foci of loculated pneumothorax post RIGHT thoracentesis. Findings called to Dr. Luan Pulling on 09/22/2015 at 1430 hours. Electronically Signed   By: Lavonia Dana M.D.   On: 09/22/2015 14:33   Ct Angio Chest Pe W/cm &/or Wo Cm  09/21/2015  CLINICAL DATA:  Persistent hypoxia. Decreased oxygen saturation with increasing oxygen requirement. EXAM: CT ANGIOGRAPHY CHEST WITH CONTRAST  TECHNIQUE: Multidetector CT imaging of the chest was performed using the standard protocol during bolus administration of intravenous contrast. Multiplanar CT image reconstructions and MIPs were obtained to evaluate the vascular anatomy. CONTRAST:  184mL OMNIPAQUE IOHEXOL 350 MG/ML SOLN COMPARISON:  CT PE protocol 09/19/2015, 2 days prior FINDINGS: There are no filling defects within the pulmonary arteries to suggest pulmonary embolus. Limited assessment of the lower lobe branches secondary to breathing motion artifact and consolidation on the right. Progressive consolidation and pleural effusion in the right hemithorax. Marked increasing in right pleural effusion, now moderate to large in circumferential. Consolidation in the right lower lobe concerning for pneumonia, with heterogeneous enhancement posteriorly. Small focus of air in the periphery of the heterogeneity and may reflect developing abscess. Complete atelectasis or consolidation throughout the right middle lobe. Compressive atelectasis in the right upper  lobe, with minimal ground-glass opacity in the aerated right upper lung. Scattered atelectasis and ground-glass opacities in the left lung. No left pleural effusion. Thoracic aorta is normal in caliber. There is no pericardial effusion. Development of mediastinal adenopathy in the interim is likely reactive. Previous prominent right hilar lymph node is obscured. Evaluation of the upper abdomen demonstrates no acute abnormality. There are no acute or suspicious osseous abnormalities. Review of the MIP images confirms the above findings. IMPRESSION: 1. No pulmonary embolus. 2. Marked progression in right pleural effusion, now moderate to large and circumferential. Consolidation in the right lower lobe with heterogeneity consistent with pneumonia, small focus of air in the periphery of the right lower lobe may reflect developing abscess. Complete consolidation throughout the right middle lobe, additional pneumonia versus compressive atelectasis. 3. Development of mediastinal adenopathy, likely reactive. Electronically Signed   By: Jeb Levering M.D.   On: 09/21/2015 21:34   Ct Angio Chest Pe W/cm &/or Wo Cm  09/19/2015  CLINICAL DATA:  Awoke with right-sided chest pain. Elevated D-dimer. EXAM: CT ANGIOGRAPHY CHEST WITH CONTRAST TECHNIQUE: Multidetector CT imaging of the chest was performed using the standard protocol during bolus administration of intravenous contrast. Multiplanar CT image reconstructions and MIPs were obtained to evaluate the vascular anatomy. CONTRAST:  12mL OMNIPAQUE IOHEXOL 350 MG/ML SOLN COMPARISON:  Radiograph earlier this day. FINDINGS: No filling defects in the central pulmonary arteries. Breathing motion artifact, soft tissue attenuation and contrast bolus timing partially limits evaluation of the distal branches. Thoracic aorta is normal in caliber. No evidence of dissection. No pericardial effusion. Prominent right hilar lymph node measures 10 mm short axis. No mediastinal adenopathy.  Dense consolidation in the right lower lobe with air bronchograms consistent with pneumonia. Small adjacent right pleural effusion. Scattered linear opacities throughout both lungs consistent with atelectasis. No left pleural effusion. Breathing motion artifact partially obscures detailed evaluation. Evaluation of the upper abdomen demonstrates no acute abnormalities. There are no acute or suspicious osseous abnormalities. Motion artifact through the sternum. Postsurgical change in the cervical spine, partially included. Review of the MIP images confirms the above findings. IMPRESSION: 1. No central pulmonary embolus. 2. Dense right lower lobe consolidation consistent with pneumonia. Small right pleural effusion. Followup PA and lateral chest X-ray is recommended in 3-4 weeks following trial of antibiotic therapy to ensure resolution and exclude underlying malignancy. 3. Scattered atelectasis throughout both lungs. Breathing motion artifact partially obscures detailed evaluation. Electronically Signed   By: Jeb Levering M.D.   On: 09/19/2015 03:31   Dg Chest Port 1 View  09/26/2015  CLINICAL DATA:  Pulmonary empyema. EXAM: PORTABLE CHEST 1 VIEW COMPARISON:  09/25/2015 FINDINGS:  Abnormal peripheral airspace opacity and/ or pleural thickening in the right mid lung with blunting of the right lateral costophrenic angle. Right PICC line tip: SVC. Right chest tubes remain in place, unchanged position. No pneumothorax identified. Equivocal blunting of the left lateral costophrenic angle. Thoracic spondylosis.  Heart size normal. IMPRESSION: 1. Pleural thickening and potential adjacent airspace opacity in the right mid lung and right lung base. Right chest tubes are in place. Overall appearance stable. Electronically Signed   By: Van Clines M.D.   On: 09/26/2015 08:12   Dg Chest Port 1 View  09/25/2015  CLINICAL DATA:  Respiratory failure, pneumonia, empyema with chest tube drainage EXAM: PORTABLE CHEST 1  VIEW COMPARISON:  Portable chest x-ray of January and CT scan of the chest of September 21, 2015. FINDINGS: The less than 5% right apical pneumothorax remains faintly visible. There is persistent increased density peripherally in the right mid lung and at the right lung base. The 2 chest tubes are in stable position. The left lung is adequately inflated. There is minimal basilar atelectasis and trace of pleural fluid on the left. The cardiac silhouette is top-normal in size. The pulmonary vascularity is not engorged. The bony thorax exhibits no acute abnormality. IMPRESSION: There has not been dramatic interval change since the study of September 24, 2015. Slight improvement in pulmonary interstitial edema on the right has occurred. There is persistent less than 5% right apical pneumothorax. The support tubes are in reasonable position. Electronically Signed   By: David  Martinique M.D.   On: 09/25/2015 08:00   Dg Chest Port 1 View  09/24/2015  CLINICAL DATA:  Right empyema. Community acquired pneumonia. Acute respiratory failure with hypoxia. Status post VATS. EXAM: PORTABLE CHEST 1 VIEW COMPARISON:  09/24/2015 FINDINGS: Right chest tubes and right arm PICC line remain in place. A tiny less than 5% right apical pneumothorax remains stable. Small right pleural effusion and right lower lung infiltrate or atelectasis is unchanged. Mild atelectasis in left lung base is also stable. IMPRESSION: Stable tiny less than 5% right apical pneumothorax. Right greater than left lower lung infiltrate or atelectasis and small pleural effusions without significant change. Electronically Signed   By: Earle Gell M.D.   On: 09/24/2015 17:17   Dg Chest Port 1 View  09/24/2015  CLINICAL DATA:  Status post right lung surgery with chest tube placement. EXAM: PORTABLE CHEST 1 VIEW COMPARISON:  Chest x-ray 05/01/2016 FINDINGS: Status post evacuation of a large complex right pleural effusion with 2 chest tubes in place. Tiny residual pleural  effusion and small apical pneumothorax. Right lung demonstrates mild edema and atelectasis. The left lung is stable. Minimal left basilar atelectasis. A right PICC line is in good position with its tip in the distal SVC near the cavoatrial junction. IMPRESSION: Status post evacuation of a large complex right pleural effusion with 2 chest tubes in place. There is a small apical pneumothorax. Mild edema and atelectasis. Electronically Signed   By: Marijo Sanes M.D.   On: 09/24/2015 13:43   Dg Chest Portable 1 View  09/19/2015  CLINICAL DATA:  Acute onset of stabbing right rib cage pain. Initial encounter. EXAM: PORTABLE CHEST 1 VIEW COMPARISON:  Chest radiograph performed 04/08/2009 FINDINGS: The lungs are mildly hypoexpanded. Bibasilar airspace opacities, right greater than left, may reflect pneumonia or pulmonary edema. Underlying vascular congestion is noted. A small right pleural effusion is suspected. No pneumothorax is seen. The cardiomediastinal silhouette is borderline normal in size. No acute osseous abnormalities are  seen. IMPRESSION: Lungs mildly hypoexpanded. Vascular congestion noted. Bibasilar airspace opacities, right greater than left, may reflect pneumonia or pulmonary edema. Suspect small right pleural effusion. Electronically Signed   By: Garald Balding M.D.   On: 09/19/2015 02:16   US Thoracentesis Asp Pleural Space W/img Guide  09/22/2015  CLINICAL DATA:  RIGHT pleural effusion EXAM: ULTRASOUND GUIDED DIAGNOSTIC AND THERAPEUTIC THORACENTESIS COMPARISON:  CT chest 09/21/2015 PROCEDURE: Procedure, benefits, and risks of procedure were discussed with patient. Written informed consent for procedure was obtained. Time out protocol followed. Pleural effusion localized by ultrasound at the posterior RIGHT hemithorax. Pleural effusion is complex, containing multiple scattered areas of internal echogenicity with multiple loculations/septations. Skin prepped and draped in usual sterile fashion. Skin  and soft tissues anesthetized with 10 mL of 1% lidocaine. 8 French thoracentesis catheter placed into the RIGHT pleural space. 33 mL of serosanguineous fluid aspirated by syringe pump. Procedure tolerated well by patient without immediate complication. COMPLICATIONS: No immediate complications ; post procedural chest radiograph reported separately. FINDINGS: A total of approximately 33 mL of RIGHT pleural fluid was removed. Entire specimen was sent for requested laboratory analysis. IMPRESSION: Successful ultrasound guided RIGHT thoracentesis yielding 33 mL of pleural fluid. Sonographically the RIGHT pleural effusion appears complex and multiloculated. Findings called to Dr.  Luan Pulling on 09/22/2015 at 1430 hours. Electronically Signed   By: Lavonia Dana M.D.   On: 09/22/2015 14:38    Oren Binet, MD  [facility] Pager:[phone]  If 7PM-7AM, please contact night-coverage [email or web address] Password [email or web address] 09/26/2015, 1:18 PM   LOS: 7 days

## 2015-09-26 NOTE — Progress Notes (Signed)
2 Days Post-Op Procedure(s) (LRB): VIDEO BRONCHOSCOPY (N/A) VIDEO ASSISTED THORACOSCOPY (VATS)/DECORTICATION (Right) Subjective: C/o incisional pain Denies nausea, + BM  Objective: Vital signs in last 24 hours: Temp:  [97.6 F (36.4 C)-98.6 F (37 C)] 97.8 F (36.6 C) (01/13 0358) Pulse Rate:  [75-85] 75 (01/13 0358) Cardiac Rhythm:  [-] Normal sinus rhythm (01/12 2316) Resp:  [16-20] 18 (01/13 0735) BP: (119-138)/(72-87) 123/75 mmHg (01/13 0358) SpO2:  [91 %-97 %] 96 % (01/13 0735) Arterial Line BP: (135)/(59) 135/59 mmHg (01/12 0811)  Hemodynamic parameters for last 24 hours:    Intake/Output from previous day: 01/12 0701 - 01/13 0700 In: 1150 [P.O.:720; I.V.:130; IV Piggyback:300] Out: 1675 [Urine:1575; Chest Tube:100] Intake/Output this shift:    General appearance: alert, cooperative and obvious discomfort with movement Neurologic: intact Heart: regular rate and rhythm Lungs: diminished breath sounds bibasilar no air leak, minimal serosanguinous CT output  Lab Results:  Recent Labs  09/25/15 0430 09/26/15 0400  WBC 14.1* 12.3*  HGB 12.4 12.0  HCT 40.2 38.6  PLT 384 352   BMET:  Recent Labs  09/25/15 0430 09/26/15 0400  NA 143 143  K 3.5 2.9*  CL 102 102  CO2 31 30  GLUCOSE 125* 96  BUN 7 7  CREATININE 0.54 0.62  CALCIUM 8.6* 8.6*    PT/INR:  Recent Labs  09/24/15 0509  LABPROT 16.6*  INR 1.33   ABG    Component Value Date/Time   PHART 7.409 09/25/2015 0450   HCO3 30.7* 09/25/2015 0450   TCO2 32.2 09/25/2015 0450   O2SAT 95.2 09/25/2015 0450   CBG (last 3)  No results for input(s): GLUCAP in the last 72 hours.  Assessment/Plan: S/P Procedure(s) (LRB): VIDEO BRONCHOSCOPY (N/A) VIDEO ASSISTED THORACOSCOPY (VATS)/DECORTICATION (Right) POD # 2 decortication   RESP- no air leak, minimal drainage. Still has basilar opacity- typical post decortication appearance  Dc diaphragmatic tube  Remaining CT to water seal  IS/ Flutter  ID-  cultures no growth to date, on empiric vanco and zosyn  Afebrile, WBC trending down  CV- stable, no issues  RENAL- hypokalemia- 4 runs ordered IV, will give PO as well  Mobilize  SCD + enoxaparin for DVT prophylaxis  Pain control remains an issue- on PCA + PO morphine      LOS: 7 days    Melrose Nakayama 09/26/2015

## 2015-09-26 NOTE — Evaluation (Signed)
Physical Therapy Evaluation Patient Details Name: [name] [name] MRN: [id number] DOB: 1961-07-24 Today's Date: 09/26/2015   History of Present Illness  Pt is a 55 y/o F admitted on 09/19/15 for Rt empyema and is not s/p VATS/decortication.  Pt's PMH includes COPD, Rt LE nerve damage, fibromyalgia, Bil carpal tunnel syndrome, back/neck ? surgery.  Clinical Impression  Pt admitted with above diagnosis. Pt currently with functional limitations due to the deficits listed below (see PT Problem List). [name] [name] ambulated 100 ft using RW in hallway w/ min guard assist, limited due to fatigue.  Encouraged pt to maintain upright posture when sitting or standing as she demonstrates operative site guarding.  She will have 24/7 assist available upon d/c from her partner, Beverlee Nims.  Pt will benefit from skilled PT to increase their independence and safety with mobility to allow discharge to the venue listed below.      Follow Up Recommendations No PT follow up;Supervision for mobility/OOB    Equipment Recommendations  Rolling walker with 5" wheels    Recommendations for Other Services OT consult     Precautions / Restrictions Precautions Precautions: Fall Precaution Comments: chest tube Restrictions Weight Bearing Restrictions: No      Mobility  Bed Mobility Overal bed mobility: Needs Assistance;+ 2 for safety/equipment Bed Mobility: Rolling;Sit to Sidelying Rolling: Min guard       Sit to sidelying: Min assist;+2 for safety/equipment General bed mobility comments: +2 for management of lines/tubes.  Min assist w/ bringing Bil LEs into bed  Transfers Overall transfer level: Needs assistance Equipment used: Rolling walker (2 wheeled) Transfers: Sit to/from Stand Sit to Stand: Min guard         General transfer comment: Cues to reach back to bed when sitting down, pt slow to stand and sit due to pain  Ambulation/Gait Ambulation/Gait assistance: Min guard Ambulation Distance (Feet):  100 Feet Assistive device: Rolling walker (2 wheeled) Gait Pattern/deviations: Step-through pattern;Antalgic;Trunk flexed;Decreased stride length   Gait velocity interpretation: Below normal speed for age/gender General Gait Details: Cues for upright posture as pt demonstrates flexed posture due to pain.  SpO2 remains 91% or higher on RA.    Stairs            Wheelchair Mobility    Modified Rankin (Stroke Patients Only)       Balance Overall balance assessment: Needs assistance Sitting-balance support: Feet supported;No upper extremity supported Sitting balance-Leahy Scale: Good     Standing balance support: Bilateral upper extremity supported;During functional activity Standing balance-Leahy Scale: Fair                               Pertinent Vitals/Pain Pain Assessment: 0-10 Pain Score: 2  Pain Location: chest tube site Pain Descriptors / Indicators: Operative site guarding;Aching;Grimacing Pain Intervention(s): Limited activity within patient's tolerance;Monitored during session;Repositioned    Home Living Family/patient expects to be discharged to:: Private residence Living Arrangements: Spouse/significant other Available Help at Discharge: Friend(s);Available 24 hours/day (partner, Beverlee Nims) Type of Home: House Home Access: Stairs to enter Entrance Stairs-Rails: Left;Right;Can reach both Technical brewer of Steps: 4 Home Layout: One level Home Equipment: Cane - single point      Prior Function Level of Independence: Independent with assistive device(s)         Comments: Would use cane only when her chronic back pain would flare up     Hand Dominance        Extremity/Trunk Assessment  Upper Extremity Assessment: Defer to OT evaluation           Lower Extremity Assessment: Overall WFL for tasks assessed      Cervical / Trunk Assessment: Other exceptions  Communication   Communication: No difficulties  Cognition  Arousal/Alertness: Awake/alert Behavior During Therapy: WFL for tasks assessed/performed Overall Cognitive Status: Within Functional Limits for tasks assessed                      General Comments      Exercises Shoulder Exercises Shoulder Flexion: AROM;Right;5 reps;Supine;Limitations (limited to 90 deg due to chest tube)      Assessment/Plan    PT Assessment Patient needs continued PT services  PT Diagnosis Difficulty walking;Acute pain   PT Problem List Decreased strength;Decreased range of motion;Decreased activity tolerance;Decreased knowledge of use of DME;Decreased safety awareness;Cardiopulmonary status limiting activity;Pain  PT Treatment Interventions DME instruction;Gait training;Stair training;Functional mobility training;Therapeutic activities;Therapeutic exercise;Balance training;Patient/family education   PT Goals (Current goals can be found in the Care Plan section) Acute Rehab PT Goals Patient Stated Goal: to go home PT Goal Formulation: With patient/family Time For Goal Achievement: 10/03/15 Potential to Achieve Goals: Good    Frequency Min 3X/week   Barriers to discharge Inaccessible home environment steps to enter home    Co-evaluation               End of Session   Activity Tolerance: Patient tolerated treatment well;Patient limited by fatigue Patient left: in bed;with call bell/phone within reach;with family/visitor present Nurse Communication: Mobility status;Other (comment) (requested an OT order)         Time: [id number] PT Time Calculation (min) (ACUTE ONLY): 25 min   Charges:   PT Evaluation $PT Eval Moderate Complexity: 1 Procedure PT Treatments $Gait Training: 8-22 mins   PT G CodesJoslyn Hy PT, DPT [phone] Pager: [phone] 09/26/2015, 10:57 AM

## 2015-09-26 NOTE — Progress Notes (Signed)
Pharmacy Antibiotic Follow-up Note  [name] [name] is a 55 y.o. year-old female admitted on 09/19/2015.  The patient is currently on day #5 of Zosyn and Vancomycin for right empyema.  Assessment/Plan:  Vancomycin discontinued this morning.  Continues on Zosyn 3.375 gm IV q8hrs (each over 4 hrs)  POD #2 VATS/drainage of empyema.  Minimal drainage today, one chest tube to be removed.  Will follow up final cultures, renal function, progress.  Temp (24hrs), Avg:98 F (36.7 C), Min:97.6 F (36.4 C), Max:98.6 F (37 C)   Recent Labs Lab 09/21/15 0621 09/23/15 1600 09/24/15 0509 09/25/15 0430 09/26/15 0400  WBC 20.8* 11.1* 12.7* 14.1* 12.3*    Recent Labs Lab 09/20/15 0613 09/23/15 1600 09/25/15 0430 09/26/15 0400  CREATININE 0.70 0.79 0.54 0.62   Estimated Creatinine Clearance: 100.1 mL/min (by C-G formula based on Cr of 0.62).    Allergies  Allergen Reactions  . Celebrex [Celecoxib] Other (See Comments)    Severe epigastric pain  . Nsaids Other (See Comments)    SEVERE EPIGASTRIC PAIN  . Percocet [Oxycodone-Acetaminophen] Itching    Antimicrobials this admission: Vanc 1/9>>1/13 Zosyn 1/9>> Azithro 1/6>>1/8 CTX 1/6>>1/7 LVQ 1/9>>1/10  Levels/dose changes this admission:  Vanc level ordered for 1:30pm today, but now cancelled as Vanc dc'd.  Microbiology results: 1/11 bronchial washings - no organisms on gram stain, ng x 2 days so far 1/11 right lung tissue x 3 - no organisms on gram stain, ng x 2 days so far 1/11 right pleural fluid x 2 - ng x 2 days so far 1/11 MRSA screen neg 1/9 pleural fluid - ng x 4 days so far 1/11 plerual fluid x 2  - on organisms on gram stain, ng x 1 day so far   Arty Baumgartner, [place] Pager: [phone] 09/26/2015 10:48 AM

## 2015-09-27 ENCOUNTER — Inpatient Hospital Stay (HOSPITAL_COMMUNITY): Payer: PPO

## 2015-09-27 DIAGNOSIS — E039 Hypothyroidism, unspecified: Secondary | ICD-10-CM | POA: Diagnosis not present

## 2015-09-27 DIAGNOSIS — J9601 Acute respiratory failure with hypoxia: Secondary | ICD-10-CM | POA: Diagnosis not present

## 2015-09-27 DIAGNOSIS — G894 Chronic pain syndrome: Secondary | ICD-10-CM | POA: Diagnosis not present

## 2015-09-27 DIAGNOSIS — J869 Pyothorax without fistula: Secondary | ICD-10-CM | POA: Diagnosis not present

## 2015-09-27 DIAGNOSIS — J189 Pneumonia, unspecified organism: Secondary | ICD-10-CM | POA: Diagnosis not present

## 2015-09-27 DIAGNOSIS — F418 Other specified anxiety disorders: Secondary | ICD-10-CM | POA: Diagnosis not present

## 2015-09-27 LAB — TISSUE CULTURE
Culture: NO GROWTH
Culture: NO GROWTH
Culture: NO GROWTH

## 2015-09-27 LAB — BASIC METABOLIC PANEL
Anion gap: 8 (ref 5–15)
BUN: 9 mg/dL (ref 6–20)
CO2: 30 mmol/L (ref 22–32)
Calcium: 8.8 mg/dL — ABNORMAL LOW (ref 8.9–10.3)
Chloride: 103 mmol/L (ref 101–111)
Creatinine, Ser: 0.68 mg/dL (ref 0.44–1.00)
GFR calc Af Amer: >60 mL/min (ref >60)
GFR calc non Af Amer: >60 mL/min (ref >60)
Glucose, Bld: 116 mg/dL — ABNORMAL HIGH (ref 65–99)
Potassium: 3.6 mmol/L (ref 3.5–5.1)
Sodium: 141 mmol/L (ref 135–145)

## 2015-09-27 LAB — CULTURE, BODY FLUID W GRAM STAIN -BOTTLE: Culture: NO GROWTH

## 2015-09-27 MED ORDER — AMOXICILLIN-POT CLAVULANATE 875-125 MG PO TABS
1.0000 | ORAL_TABLET | Freq: Two times a day (BID) | ORAL | Status: DC
  Administered 2015-09-27 – 2015-09-29 (×5): 1 via ORAL
  Filled 2015-09-27 (×7): qty 1

## 2015-09-27 NOTE — Progress Notes (Signed)
PATIENT DETAILS Name: [name] [name] Age: 55 y.o. Sex: female Date of Birth: [birth year] Admit Date: 09/19/2015 Admitting Physician Orvan Falconer, MD SX:[id number], CAMMIE, MD  Subjective: Pain slowly improving.   Assessment/Plan: Principal Problem: Acute Hypoxic Resp Failure:secondary to PNA and large loculated pleural effusion. Underwent VATS on 1/11. Titrated off O2-now on room air  Active Problems: CAP with empyema:underwent VATS, drainage of empyema and placement of chest tube. Cultures negative so far,  Vancomycin discontinued-will also disccontinue Zosyn today and transition to Augmentin.CTVS following. Pain continues to improve, will see if we can discontinue PCA-and use prn IV medications for breakthrough pain. Post op care managed by CTVS  Hypothyroidism:continue Synthroid  Chronic Pain Syndrome/Fibromyalgia:continue narcotics  Anxiety/Depression:stable, continue Zoloft and Wellbutrin  Obesity:counseled regarding importance of weight loss.   Disposition: Remain inpatient-remain in SDU  Antimicrobial agents  See below  Anti-infectives    Start     Dose/Rate Route Frequency Ordered Stop   09/22/15 1400  vancomycin (VANCOCIN) IVPB 1000 mg/200 mL premix  Status:  Discontinued     1,000 mg 200 mL/hr over 60 Minutes Intravenous Every 12 hours 09/22/15 0728 09/26/15 0758   09/22/15 0200  piperacillin-tazobactam (ZOSYN) IVPB 3.375 g     3.375 g 12.5 mL/hr over 240 Minutes Intravenous Every 8 hours 09/22/15 0132     09/22/15 0200  vancomycin (VANCOCIN) IVPB 1000 mg/200 mL premix     1,000 mg 200 mL/hr over 60 Minutes Intravenous Every 1 hr x 2 09/22/15 0132 09/22/15 0400   09/22/15 0130  levofloxacin (LEVAQUIN) IVPB 750 mg  Status:  Discontinued     750 mg 100 mL/hr over 90 Minutes Intravenous Every 24 hours 09/22/15 0125 09/23/15 1432   09/20/15 0400  azithromycin (ZITHROMAX) 500 mg in dextrose 5 % 250 mL IVPB  Status:  Discontinued     500 mg 250 mL/hr over  60 Minutes Intravenous Every 24 hours 09/19/15 0733 09/22/15 0125   09/19/15 1600  cefTRIAXone (ROCEPHIN) 1 g in dextrose 5 % 50 mL IVPB  Status:  Discontinued     1 g 100 mL/hr over 30 Minutes Intravenous Every 24 hours 09/19/15 0733 09/22/15 0125   09/19/15 0400  cefTRIAXone (ROCEPHIN) 1 g in dextrose 5 % 50 mL IVPB     1 g 100 mL/hr over 30 Minutes Intravenous  Once 09/19/15 0349 09/19/15 0433   09/19/15 0400  azithromycin (ZITHROMAX) 500 mg in dextrose 5 % 250 mL IVPB  Status:  Discontinued     500 mg 250 mL/hr over 60 Minutes Intravenous Every 24 hours 09/19/15 0349 09/19/15 1306      DVT Prophylaxis: Prophylactic Lovenox   Code Status: Full code   Family Communication None at bedsdie  Procedures: VATS >>1/11  CONSULTS:  CTVS  Time spent 25 minutes-Greater than 50% of this time was spent in counseling, explanation of diagnosis, planning of further management, and coordination of care.  MEDICATIONS: Scheduled Meds: . acetaminophen  1,000 mg Oral 4 times per day   Or  . acetaminophen (TYLENOL) oral liquid 160 mg/5 mL  1,000 mg Oral 4 times per day  . bisacodyl  10 mg Oral Daily  . buPROPion  200 mg Oral BID  . fentaNYL   Intravenous 6 times per day  . gabapentin  600 mg Oral TID  . levothyroxine  112 mcg Oral QAC breakfast  . metoCLOPramide (REGLAN) injection  10 mg Intravenous 4 times  per day  . morphine  60 mg Oral Q12H  . piperacillin-tazobactam (ZOSYN)  IV  3.375 g Intravenous Q8H  . potassium chloride  20 mEq Oral BID  . predniSONE  30 mg Oral Q breakfast  . senna-docusate  1 tablet Oral QHS  . senna-docusate  2 tablet Oral QHS  . sertraline  100 mg Oral Daily   Continuous Infusions: . sodium chloride 10 mL/hr at 09/25/15 0905   PRN Meds:.albuterol, bisacodyl, diazepam, diphenhydrAMINE **OR** diphenhydrAMINE, methocarbamol, morphine, naloxone **AND** sodium chloride, ondansetron (ZOFRAN) IV, ondansetron **OR** ondansetron (ZOFRAN) IV, sodium chloride,  traZODone    PHYSICAL EXAM: Vital signs in last 24 hours: Filed Vitals:   09/26/15 1551 09/26/15 1944 09/26/15 2301 09/27/15 0416  BP:  118/69 139/81 135/74  Pulse:  71 71 82  Temp: 98 F (36.7 C) 98.2 F (36.8 C) 97.8 F (36.6 C) 98.1 F (36.7 C)  TempSrc: Oral Oral Oral Oral  Resp: 14 13 15 15   Height:      Weight:      SpO2: 94% 93% 92% 95%    Weight change:  Filed Weights   09/19/15 0106 09/19/15 0638  Weight: 108.863 kg (240 lb) 108.228 kg (238 lb 9.6 oz)   Body mass index is 38.53 kg/(m^2).   Gen Exam: Awake and alert with clear speech.   Neck: Supple, No JVD.   Chest: B/L Clear.   CVS: S1 S2 Regular, no murmurs.  Abdomen: soft, BS +, non tender, non distended.  Extremities: no edema, lower extremities warm to touch. Neurologic: Non Focal.   Skin: No Rash.   Wounds: N/A.   Intake/Output from previous day:  Intake/Output Summary (Last 24 hours) at 09/27/15 0838 Last data filed at 09/27/15 0600  Gross per 24 hour  Intake    630 ml  Output   3600 ml  Net  -2970 ml     LAB RESULTS: CBC  Recent Labs Lab 09/21/15 0621 09/23/15 1600 09/24/15 0509 09/24/15 1045 09/25/15 0430 09/26/15 0400  WBC 20.8* 11.1* 12.7*  --  14.1* 12.3*  HGB 12.5 11.8* 12.1 13.6 12.4 12.0  HCT 39.1 37.8 38.6 40.0 40.2 38.6  PLT 338 296 328  --  384 352  MCV 97.0 96.9 96.5  --  96.9 97.2  MCH 31.0 30.3 30.3  --  29.9 30.2  MCHC 32.0 31.2 31.3  --  30.8 31.1  RDW 12.3 12.5 12.5  --  12.7 12.7    Chemistries   Recent Labs Lab 09/23/15 1600 09/24/15 1045 09/25/15 0430 09/26/15 0400 09/27/15 0612  NA 142 142 143 143 141  K 3.3* 3.8 3.5 2.9* 3.6  CL 101  --  102 102 103  CO2 30  --  31 30 30   GLUCOSE 209*  --  125* 96 116*  BUN 11  --  7 7 9   CREATININE 0.79  --  0.54 0.62 0.68  CALCIUM 8.7*  --  8.6* 8.6* 8.8*    CBG: No results for input(s): GLUCAP in the last 168 hours.  GFR Estimated Creatinine Clearance: 100.1 mL/min (by C-G formula based on Cr of  0.68).  Coagulation profile  Recent Labs Lab 09/24/15 0509  INR 1.33    Cardiac Enzymes No results for input(s): CKMB, TROPONINI, MYOGLOBIN in the last 168 hours.  Invalid input(s): CK  Invalid input(s): POCBNP No results for input(s): DDIMER in the last 72 hours. No results for input(s): HGBA1C in the last 72 hours. No results for input(s): CHOL,  HDL, LDLCALC, TRIG, CHOLHDL, LDLDIRECT in the last 72 hours. No results for input(s): TSH, T4TOTAL, T3FREE, THYROIDAB in the last 72 hours.  Invalid input(s): FREET3 No results for input(s): VITAMINB12, FOLATE, FERRITIN, TIBC, IRON, RETICCTPCT in the last 72 hours. No results for input(s): LIPASE, AMYLASE in the last 72 hours.  Urine Studies No results for input(s): UHGB, CRYS in the last 72 hours.  Invalid input(s): UACOL, UAPR, USPG, UPH, UTP, UGL, UKET, UBIL, UNIT, UROB, ULEU, UEPI, UWBC, URBC, UBAC, CAST, UCOM, BILUA  MICROBIOLOGY: Recent Results (from the past 240 hour(s))  Culture, body fluid-bottle     Status: None   Collection Time: 09/22/15  2:21 PM  Result Value Ref Range Status   Specimen Description FLUID PLEURAL  Final   Special Requests BOTTLES DRAWN AEROBIC AND ANAEROBIC [place]  Final   Culture NO GROWTH 5 DAYS  Final   Report Status 09/27/2015 FINAL  Final  Gram stain     Status: None (Preliminary result)   Collection Time: 09/22/15  2:25 PM  Result Value Ref Range Status   Specimen Description FLUID  Final   Special Requests NONE  Final   Gram Stain NO ORGANISMS SEEN PERFORMED AT [facility]   Final   Report Status PENDING  Incomplete  Surgical pcr screen     Status: None   Collection Time: 09/24/15 12:15 AM  Result Value Ref Range Status   MRSA, PCR NEGATIVE NEGATIVE Final   Staphylococcus aureus NEGATIVE NEGATIVE Final    Comment:        The Xpert SA Assay (FDA approved for NASAL specimens in patients over 46 years of age), is one component of a comprehensive surveillance program.  Test performance  has been validated by [facility] for patients greater than or equal to 19 year old. It is not intended to diagnose infection nor to guide or monitor treatment.   Culture, respiratory (NON-Expectorated)     Status: None   Collection Time: 09/24/15  9:00 AM  Result Value Ref Range Status   Specimen Description BRONCHIAL WASHINGS RIGHT  Final   Special Requests NONE  Final   Gram Stain   Final    FEW WBC PRESENT, PREDOMINANTLY PMN NO SQUAMOUS EPITHELIAL CELLS SEEN NO ORGANISMS SEEN Performed at [facility]    Culture   Final    NO GROWTH 2 DAYS Performed at [facility]    Report Status 09/26/2015 FINAL  Final  Culture, body fluid-bottle     Status: None (Preliminary result)   Collection Time: 09/24/15  9:43 AM  Result Value Ref Range Status   Specimen Description FLUID RIGHT PLEURAL  Final   Special Requests   Final    POF ZOSYN PART B BOTTLES DRAWN AEROBIC AND ANAEROBIC 10MLS   Culture NO GROWTH 2 DAYS  Final   Report Status PENDING  Incomplete  Gram stain     Status: None   Collection Time: 09/24/15  9:43 AM  Result Value Ref Range Status   Specimen Description FLUID RIGHT PLEURAL  Final   Special Requests POF ZOSYN PART B  Final   Gram Stain   Final    RARE WBC PRESENT,BOTH PMN AND MONONUCLEAR NO ORGANISMS SEEN    Report Status 09/24/2015 FINAL  Final  Culture, body fluid-bottle     Status: None (Preliminary result)   Collection Time: 09/24/15  9:50 AM  Result Value Ref Range Status   Specimen Description FLUID RIGHT PLEURAL  Final   Special Requests POF  ZOSYN PART C BOTTLES DRAWN AEROBIC ONLY 5MLS  Final   Culture NO GROWTH 2 DAYS  Final   Report Status PENDING  Incomplete  Gram stain     Status: None   Collection Time: 09/24/15  9:50 AM  Result Value Ref Range Status   Specimen Description FLUID RIGHT PLEURAL  Final   Special Requests POF ZOSYN PART C  Final   Gram Stain   Final    FEW WBC PRESENT,BOTH PMN AND MONONUCLEAR NO ORGANISMS SEEN     Report Status 09/24/2015 FINAL  Final  Tissue culture     Status: None   Collection Time: 09/24/15  9:58 AM  Result Value Ref Range Status   Specimen Description TISSUE RIGHT LUNG  Final   Special Requests POF ZOSYN  Final   Gram Stain   Final    ABUNDANT WBC PRESENT,BOTH PMN AND MONONUCLEAR NO ORGANISMS SEEN Performed at [facility]    Culture   Final    NO GROWTH 3 DAYS Performed at [facility]    Report Status 09/27/2015 FINAL  Final  Tissue culture     Status: None   Collection Time: 09/24/15 10:09 AM  Result Value Ref Range Status   Specimen Description TISSUE RIGHT LUNG  Final   Special Requests RIGHT VISCERAL PEEL POF ZOSYN  Final   Gram Stain   Final    RARE WBC PRESENT, PREDOMINANTLY PMN NO ORGANISMS SEEN Performed at [facility]    Culture   Final    NO GROWTH 3 DAYS Performed at [facility]    Report Status 09/27/2015 FINAL  Final  Tissue culture     Status: None   Collection Time: 09/24/15 10:43 AM  Result Value Ref Range Status   Specimen Description TISSUE RIGHT LUNG  Final   Special Requests RIGHT PERIATAL PLEURA POF ZOSYN  Final   Gram Stain   Final    MODERATE WBC PRESENT,BOTH PMN AND MONONUCLEAR NO ORGANISMS SEEN Performed at [facility]    Culture   Final    NO GROWTH 3 DAYS Performed at [facility]    Report Status 09/27/2015 FINAL  Final    RADIOLOGY STUDIES/RESULTS: Dg Chest 1 View  09/22/2015  CLINICAL DATA:  Loculated RIGHT pleural effusion post thoracentesis EXAM: CHEST 1 VIEW COMPARISON:  CT chest 09/21/2015, chest radiograph 09/19/2015 FINDINGS: Enlargement of cardiac silhouette with pulmonary vascular congestion. Diffuse infiltrates question pulmonary edema. Moderate to large RIGHT pleural effusion, partially loculated by prior CT. No apical pneumothorax identified. Persistent atelectasis of RIGHT middle and RIGHT lower lobes as well as portion of RIGHT upper lobe. Minimal LEFT  basilar atelectasis. Several foci of gas at the mid RIGHT lung and at the RIGHT base are identified, question aerated lung versus potentially small foci of loculated pneumothorax post thoracentesis. Minimal degenerative disc disease changes thoracic spine. IMPRESSION: Pulmonary edema. Persistent moderate to large loculated RIGHT pleural effusion with extensive atelectasis of the mid to lower RIGHT lung. Several foci of gas noted in the RIGHT mid lung and at the lower RIGHT chest question foci of aerated lung versus small foci of loculated pneumothorax post RIGHT thoracentesis. Findings called to Dr. Luan Pulling on 09/22/2015 at 1430 hours. Electronically Signed   By: Lavonia Dana M.D.   On: 09/22/2015 14:33   Ct Angio Chest Pe W/cm &/or Wo Cm  09/21/2015  CLINICAL DATA:  Persistent hypoxia. Decreased oxygen saturation with increasing oxygen requirement. EXAM: CT ANGIOGRAPHY CHEST WITH  CONTRAST TECHNIQUE: Multidetector CT imaging of the chest was performed using the standard protocol during bolus administration of intravenous contrast. Multiplanar CT image reconstructions and MIPs were obtained to evaluate the vascular anatomy. CONTRAST:  129mL OMNIPAQUE IOHEXOL 350 MG/ML SOLN COMPARISON:  CT PE protocol 09/19/2015, 2 days prior FINDINGS: There are no filling defects within the pulmonary arteries to suggest pulmonary embolus. Limited assessment of the lower lobe branches secondary to breathing motion artifact and consolidation on the right. Progressive consolidation and pleural effusion in the right hemithorax. Marked increasing in right pleural effusion, now moderate to large in circumferential. Consolidation in the right lower lobe concerning for pneumonia, with heterogeneous enhancement posteriorly. Small focus of air in the periphery of the heterogeneity and may reflect developing abscess. Complete atelectasis or consolidation throughout the right middle lobe. Compressive atelectasis in the right upper lobe, with  minimal ground-glass opacity in the aerated right upper lung. Scattered atelectasis and ground-glass opacities in the left lung. No left pleural effusion. Thoracic aorta is normal in caliber. There is no pericardial effusion. Development of mediastinal adenopathy in the interim is likely reactive. Previous prominent right hilar lymph node is obscured. Evaluation of the upper abdomen demonstrates no acute abnormality. There are no acute or suspicious osseous abnormalities. Review of the MIP images confirms the above findings. IMPRESSION: 1. No pulmonary embolus. 2. Marked progression in right pleural effusion, now moderate to large and circumferential. Consolidation in the right lower lobe with heterogeneity consistent with pneumonia, small focus of air in the periphery of the right lower lobe may reflect developing abscess. Complete consolidation throughout the right middle lobe, additional pneumonia versus compressive atelectasis. 3. Development of mediastinal adenopathy, likely reactive. Electronically Signed   By: Jeb Levering M.D.   On: 09/21/2015 21:34   Ct Angio Chest Pe W/cm &/or Wo Cm  09/19/2015  CLINICAL DATA:  Awoke with right-sided chest pain. Elevated D-dimer. EXAM: CT ANGIOGRAPHY CHEST WITH CONTRAST TECHNIQUE: Multidetector CT imaging of the chest was performed using the standard protocol during bolus administration of intravenous contrast. Multiplanar CT image reconstructions and MIPs were obtained to evaluate the vascular anatomy. CONTRAST:  178mL OMNIPAQUE IOHEXOL 350 MG/ML SOLN COMPARISON:  Radiograph earlier this day. FINDINGS: No filling defects in the central pulmonary arteries. Breathing motion artifact, soft tissue attenuation and contrast bolus timing partially limits evaluation of the distal branches. Thoracic aorta is normal in caliber. No evidence of dissection. No pericardial effusion. Prominent right hilar lymph node measures 10 mm short axis. No mediastinal adenopathy. Dense  consolidation in the right lower lobe with air bronchograms consistent with pneumonia. Small adjacent right pleural effusion. Scattered linear opacities throughout both lungs consistent with atelectasis. No left pleural effusion. Breathing motion artifact partially obscures detailed evaluation. Evaluation of the upper abdomen demonstrates no acute abnormalities. There are no acute or suspicious osseous abnormalities. Motion artifact through the sternum. Postsurgical change in the cervical spine, partially included. Review of the MIP images confirms the above findings. IMPRESSION: 1. No central pulmonary embolus. 2. Dense right lower lobe consolidation consistent with pneumonia. Small right pleural effusion. Followup PA and lateral chest X-ray is recommended in 3-4 weeks following trial of antibiotic therapy to ensure resolution and exclude underlying malignancy. 3. Scattered atelectasis throughout both lungs. Breathing motion artifact partially obscures detailed evaluation. Electronically Signed   By: Jeb Levering M.D.   On: 09/19/2015 03:31   Dg Chest Port 1 View  09/26/2015  CLINICAL DATA:  Pulmonary empyema. EXAM: PORTABLE CHEST 1 VIEW COMPARISON:  09/25/2015  FINDINGS: Abnormal peripheral airspace opacity and/ or pleural thickening in the right mid lung with blunting of the right lateral costophrenic angle. Right PICC line tip: SVC. Right chest tubes remain in place, unchanged position. No pneumothorax identified. Equivocal blunting of the left lateral costophrenic angle. Thoracic spondylosis.  Heart size normal. IMPRESSION: 1. Pleural thickening and potential adjacent airspace opacity in the right mid lung and right lung base. Right chest tubes are in place. Overall appearance stable. Electronically Signed   By: Van Clines M.D.   On: 09/26/2015 08:12   Dg Chest Port 1 View  09/25/2015  CLINICAL DATA:  Respiratory failure, pneumonia, empyema with chest tube drainage EXAM: PORTABLE CHEST 1 VIEW  COMPARISON:  Portable chest x-ray of January and CT scan of the chest of September 21, 2015. FINDINGS: The less than 5% right apical pneumothorax remains faintly visible. There is persistent increased density peripherally in the right mid lung and at the right lung base. The 2 chest tubes are in stable position. The left lung is adequately inflated. There is minimal basilar atelectasis and trace of pleural fluid on the left. The cardiac silhouette is top-normal in size. The pulmonary vascularity is not engorged. The bony thorax exhibits no acute abnormality. IMPRESSION: There has not been dramatic interval change since the study of September 24, 2015. Slight improvement in pulmonary interstitial edema on the right has occurred. There is persistent less than 5% right apical pneumothorax. The support tubes are in reasonable position. Electronically Signed   By: David  Martinique M.D.   On: 09/25/2015 08:00   Dg Chest Port 1 View  09/24/2015  CLINICAL DATA:  Right empyema. Community acquired pneumonia. Acute respiratory failure with hypoxia. Status post VATS. EXAM: PORTABLE CHEST 1 VIEW COMPARISON:  09/24/2015 FINDINGS: Right chest tubes and right arm PICC line remain in place. A tiny less than 5% right apical pneumothorax remains stable. Small right pleural effusion and right lower lung infiltrate or atelectasis is unchanged. Mild atelectasis in left lung base is also stable. IMPRESSION: Stable tiny less than 5% right apical pneumothorax. Right greater than left lower lung infiltrate or atelectasis and small pleural effusions without significant change. Electronically Signed   By: Earle Gell M.D.   On: 09/24/2015 17:17   Dg Chest Port 1 View  09/24/2015  CLINICAL DATA:  Status post right lung surgery with chest tube placement. EXAM: PORTABLE CHEST 1 VIEW COMPARISON:  Chest x-ray 05/01/2016 FINDINGS: Status post evacuation of a large complex right pleural effusion with 2 chest tubes in place. Tiny residual pleural  effusion and small apical pneumothorax. Right lung demonstrates mild edema and atelectasis. The left lung is stable. Minimal left basilar atelectasis. A right PICC line is in good position with its tip in the distal SVC near the cavoatrial junction. IMPRESSION: Status post evacuation of a large complex right pleural effusion with 2 chest tubes in place. There is a small apical pneumothorax. Mild edema and atelectasis. Electronically Signed   By: Marijo Sanes M.D.   On: 09/24/2015 13:43   Dg Chest Portable 1 View  09/19/2015  CLINICAL DATA:  Acute onset of stabbing right rib cage pain. Initial encounter. EXAM: PORTABLE CHEST 1 VIEW COMPARISON:  Chest radiograph performed 04/08/2009 FINDINGS: The lungs are mildly hypoexpanded. Bibasilar airspace opacities, right greater than left, may reflect pneumonia or pulmonary edema. Underlying vascular congestion is noted. A small right pleural effusion is suspected. No pneumothorax is seen. The cardiomediastinal silhouette is borderline normal in size. No acute osseous abnormalities  are seen. IMPRESSION: Lungs mildly hypoexpanded. Vascular congestion noted. Bibasilar airspace opacities, right greater than left, may reflect pneumonia or pulmonary edema. Suspect small right pleural effusion. Electronically Signed   By: Garald Balding M.D.   On: 09/19/2015 02:16   US Thoracentesis Asp Pleural Space W/img Guide  09/22/2015  CLINICAL DATA:  RIGHT pleural effusion EXAM: ULTRASOUND GUIDED DIAGNOSTIC AND THERAPEUTIC THORACENTESIS COMPARISON:  CT chest 09/21/2015 PROCEDURE: Procedure, benefits, and risks of procedure were discussed with patient. Written informed consent for procedure was obtained. Time out protocol followed. Pleural effusion localized by ultrasound at the posterior RIGHT hemithorax. Pleural effusion is complex, containing multiple scattered areas of internal echogenicity with multiple loculations/septations. Skin prepped and draped in usual sterile fashion. Skin  and soft tissues anesthetized with 10 mL of 1% lidocaine. 8 French thoracentesis catheter placed into the RIGHT pleural space. 33 mL of serosanguineous fluid aspirated by syringe pump. Procedure tolerated well by patient without immediate complication. COMPLICATIONS: No immediate complications ; post procedural chest radiograph reported separately. FINDINGS: A total of approximately 33 mL of RIGHT pleural fluid was removed. Entire specimen was sent for requested laboratory analysis. IMPRESSION: Successful ultrasound guided RIGHT thoracentesis yielding 33 mL of pleural fluid. Sonographically the RIGHT pleural effusion appears complex and multiloculated. Findings called to Dr.  Luan Pulling on 09/22/2015 at 1430 hours. Electronically Signed   By: Lavonia Dana M.D.   On: 09/22/2015 14:38    Oren Binet, MD  [facility] Pager:[phone]  If 7PM-7AM, please contact night-coverage [email or web address] Password [email or web address] 09/27/2015, 8:38 AM   LOS: 8 days

## 2015-09-27 NOTE — Progress Notes (Addendum)
      [id number]       ,[place] [address]             [phone]      3 Days Post-Op Procedure(s) (LRB): VIDEO BRONCHOSCOPY (N/A) VIDEO ASSISTED THORACOSCOPY (VATS)/DECORTICATION (Right)   Subjective:  [name] [name] is doing better.  She states her pain has been getting better after removal of a chest tube.  + ambulation + BM  Objective: Vital signs in last 24 hours: Temp:  [97.8 F (36.6 C)-98.3 F (36.8 C)] 98.1 F (36.7 C) (01/14 0416) Pulse Rate:  [71-82] 82 (01/14 0416) Cardiac Rhythm:  [-] Normal sinus rhythm (01/14 0702) Resp:  [13-16] 15 (01/14 0416) BP: (118-139)/(69-81) 135/74 mmHg (01/14 0416) SpO2:  [92 %-95 %] 95 % (01/14 0416)  Intake/Output from previous day: 01/13 0701 - 01/14 0700 In: 630 [P.O.:480; IV Piggyback:150] Out: 3600 [Urine:3600]  General appearance: alert, cooperative and no distress Heart: regular rate and rhythm Lungs: diminished breath sounds bibasilar Abdomen: soft, non-tender; bowel sounds normal; no masses,  no organomegaly Wound: clean and dry  Lab Results:  Recent Labs  09/25/15 0430 09/26/15 0400  WBC 14.1* 12.3*  HGB 12.4 12.0  HCT 40.2 38.6  PLT 384 352   BMET:  Recent Labs  09/26/15 0400 09/27/15 0612  NA 143 141  K 2.9* 3.6  CL 102 103  CO2 30 30  GLUCOSE 96 116*  BUN 7 9  CREATININE 0.62 0.68  CALCIUM 8.6* 8.8*    PT/INR: No results for input(s): LABPROT, INR in the last 72 hours. ABG    Component Value Date/Time   PHART 7.409 09/25/2015 0450   HCO3 30.7* 09/25/2015 0450   TCO2 32.2 09/25/2015 0450   O2SAT 95.2 09/25/2015 0450   CBG (last 3)  No results for input(s): GLUCAP in the last 72 hours.  Assessment/Plan: S/P Procedure(s) (LRB): VIDEO BRONCHOSCOPY (N/A) VIDEO ASSISTED THORACOSCOPY (VATS)/DECORTICATION (Right)  1. Pulm- no air leak on water seal, 100 cc output recorded, CXR remains stable in appearance after tube removal 2. ID- remains afebrile, leukocytosis improving,  OR cultures remain negative to date, continue vanc/zosyn for now 3. Pain control- better control after chest tube removed, would continue PCA until last chest tube is removed and at that point patient should only need oral medications 4. Dispo- patient stable, no air leak with Chest tube, minimal output, will discuss management with staff   LOS: 8 days    [name] [name] 09/27/2015  Patient resting comfortably If chest tube output over 24 hours is less than 100 cc we'll remove chest tube in a.m. if x-ray shows no change. Pleural peel tissue cultures remain negative.  patient examined and medical record reviewed,agree with above note. Tharon Aquas Trigt III 09/27/2015

## 2015-09-28 ENCOUNTER — Inpatient Hospital Stay (HOSPITAL_COMMUNITY): Payer: PPO

## 2015-09-28 DIAGNOSIS — J869 Pyothorax without fistula: Secondary | ICD-10-CM | POA: Diagnosis not present

## 2015-09-28 DIAGNOSIS — F418 Other specified anxiety disorders: Secondary | ICD-10-CM | POA: Diagnosis not present

## 2015-09-28 DIAGNOSIS — G894 Chronic pain syndrome: Secondary | ICD-10-CM | POA: Diagnosis not present

## 2015-09-28 DIAGNOSIS — E039 Hypothyroidism, unspecified: Secondary | ICD-10-CM | POA: Diagnosis not present

## 2015-09-28 DIAGNOSIS — J9601 Acute respiratory failure with hypoxia: Secondary | ICD-10-CM | POA: Diagnosis not present

## 2015-09-28 DIAGNOSIS — J449 Chronic obstructive pulmonary disease, unspecified: Secondary | ICD-10-CM | POA: Diagnosis not present

## 2015-09-28 DIAGNOSIS — J189 Pneumonia, unspecified organism: Secondary | ICD-10-CM | POA: Diagnosis not present

## 2015-09-28 NOTE — Progress Notes (Signed)
Wasted 17 mL fentanyl in sink. Verified by Pamala Hurry RN

## 2015-09-28 NOTE — Progress Notes (Signed)
PATIENT DETAILS Name: [name] [name] Age: 55 y.o. Sex: female Date of Birth: 08-08-1961 Admit Date: 09/19/2015 Admitting Physician Orvan Falconer, MD [id number], CAMMIE, MD  Subjective: Pain slowly improving. Anxious to go home  Assessment/Plan: Principal Problem: Acute Hypoxic Resp Failure:secondary to PNA and large loculated pleural effusion. Underwent VATS on 1/11. Titrated off O2-now on room air  Active Problems: CAP with empyema:underwent VATS, drainage of empyema and placement of chest tube. Cultures negative so far, initially on vancomycin and Zosyn-known transitioned to Augmentin.CTVS following.  plans are to remove final chest deep today, and then discontinue PCA fentanyl. Suspect if her tinnitus to do well, should be able to discharge home tomorrow CTVS  Hypothyroidism:continue Synthroid  Chronic Pain Syndrome/Fibromyalgia:continue narcotics-discontinue PCA fentanyl once final chest tube is removed  Anxiety/Depression:stable, continue Zoloft and Wellbutrin  Obesity:counseled regarding importance of weight loss.   Disposition: Remain inpatient-remain in SDU-suspect home on 1/16   Antimicrobial agents  See below  Anti-infectives    Start     Dose/Rate Route Frequency Ordered Stop   09/27/15 0900  amoxicillin-clavulanate (AUGMENTIN) 875-125 MG per tablet 1 tablet     1 tablet Oral Every 12 hours 09/27/15 0843     09/22/15 1400  vancomycin (VANCOCIN) IVPB 1000 mg/200 mL premix  Status:  Discontinued     1,000 mg 200 mL/hr over 60 Minutes Intravenous Every 12 hours 09/22/15 0728 09/26/15 0758   09/22/15 0200  piperacillin-tazobactam (ZOSYN) IVPB 3.375 g  Status:  Discontinued     3.375 g 12.5 mL/hr over 240 Minutes Intravenous Every 8 hours 09/22/15 0132 09/27/15 0843   09/22/15 0200  vancomycin (VANCOCIN) IVPB 1000 mg/200 mL premix     1,000 mg 200 mL/hr over 60 Minutes Intravenous Every 1 hr x 2 09/22/15 0132 09/22/15 0400   09/22/15 0130  levofloxacin  (LEVAQUIN) IVPB 750 mg  Status:  Discontinued     750 mg 100 mL/hr over 90 Minutes Intravenous Every 24 hours 09/22/15 0125 09/23/15 1432   09/20/15 0400  azithromycin (ZITHROMAX) 500 mg in dextrose 5 % 250 mL IVPB  Status:  Discontinued     500 mg 250 mL/hr over 60 Minutes Intravenous Every 24 hours 09/19/15 0733 09/22/15 0125   09/19/15 1600  cefTRIAXone (ROCEPHIN) 1 g in dextrose 5 % 50 mL IVPB  Status:  Discontinued     1 g 100 mL/hr over 30 Minutes Intravenous Every 24 hours 09/19/15 0733 09/22/15 0125   09/19/15 0400  cefTRIAXone (ROCEPHIN) 1 g in dextrose 5 % 50 mL IVPB     1 g 100 mL/hr over 30 Minutes Intravenous  Once 09/19/15 0349 09/19/15 0433   09/19/15 0400  azithromycin (ZITHROMAX) 500 mg in dextrose 5 % 250 mL IVPB  Status:  Discontinued     500 mg 250 mL/hr over 60 Minutes Intravenous Every 24 hours 09/19/15 0349 09/19/15 1306      DVT Prophylaxis: Prophylactic Lovenox   Code Status: Full code   Family Communication None at bedsdie  Procedures: VATS >>1/11  CONSULTS:  CTVS  Time spent 25 minutes-Greater than 50% of this time was spent in counseling, explanation of diagnosis, planning of further management, and coordination of care.  MEDICATIONS: Scheduled Meds: . acetaminophen  1,000 mg Oral 4 times per day   Or  . acetaminophen (TYLENOL) oral liquid 160 mg/5 mL  1,000 mg Oral 4 times per day  . amoxicillin-clavulanate  1 tablet  Oral Q12H  . bisacodyl  10 mg Oral Daily  . buPROPion  200 mg Oral BID  . gabapentin  600 mg Oral TID  . levothyroxine  112 mcg Oral QAC breakfast  . metoCLOPramide (REGLAN) injection  10 mg Intravenous 4 times per day  . morphine  60 mg Oral Q12H  . potassium chloride  20 mEq Oral BID  . predniSONE  30 mg Oral Q breakfast  . senna-docusate  1 tablet Oral QHS  . senna-docusate  2 tablet Oral QHS  . sertraline  100 mg Oral Daily   Continuous Infusions: . sodium chloride 10 mL/hr at 09/25/15 0905   PRN Meds:.albuterol,  bisacodyl, diazepam, methocarbamol, morphine, ondansetron (ZOFRAN) IV, ondansetron **OR** ondansetron (ZOFRAN) IV, sodium chloride, traZODone    PHYSICAL EXAM: Vital signs in last 24 hours: Filed Vitals:   09/28/15 0332 09/28/15 0725 09/28/15 0730 09/28/15 0842  BP: 110/74 127/71    Pulse: 69 77    Temp: 97.6 F (36.4 C)  97.7 F (36.5 C)   TempSrc: Oral  Oral   Resp: 14 16  20   Height:      Weight:      SpO2: 95% 96%  97%    Weight change:  Filed Weights   09/19/15 0106 09/19/15 0638  Weight: 108.863 kg (240 lb) 108.228 kg (238 lb 9.6 oz)   Body mass index is 38.53 kg/(m^2).   Gen Exam: Awake and alert with clear speech.   Neck: Supple, No JVD.   Chest: B/L Clear.   CVS: S1 S2 Regular, no murmurs.  Abdomen: soft, BS +, non tender, non distended.  Extremities: no edema, lower extremities warm to touch. Neurologic: Non Focal.   Skin: No Rash.   Wounds: N/A.   Intake/Output from previous day:  Intake/Output Summary (Last 24 hours) at 09/28/15 1201 Last data filed at 09/28/15 0841  Gross per 24 hour  Intake  841.5 ml  Output   1850 ml  Net -1008.5 ml     LAB RESULTS: CBC  Recent Labs Lab 09/23/15 1600 09/24/15 0509 09/24/15 1045 09/25/15 0430 09/26/15 0400  WBC 11.1* 12.7*  --  14.1* 12.3*  HGB 11.8* 12.1 13.6 12.4 12.0  HCT 37.8 38.6 40.0 40.2 38.6  PLT 296 328  --  384 352  MCV 96.9 96.5  --  96.9 97.2  MCH 30.3 30.3  --  29.9 30.2  MCHC 31.2 31.3  --  30.8 31.1  RDW 12.5 12.5  --  12.7 12.7    Chemistries   Recent Labs Lab 09/23/15 1600 09/24/15 1045 09/25/15 0430 09/26/15 0400 09/27/15 0612  NA 142 142 143 143 141  K 3.3* 3.8 3.5 2.9* 3.6  CL 101  --  102 102 103  CO2 30  --  31 30 30   GLUCOSE 209*  --  125* 96 116*  BUN 11  --  7 7 9   CREATININE 0.79  --  0.54 0.62 0.68  CALCIUM 8.7*  --  8.6* 8.6* 8.8*    CBG: No results for input(s): GLUCAP in the last 168 hours.  GFR Estimated Creatinine Clearance: 100.1 mL/min (by C-G  formula based on Cr of 0.68).  Coagulation profile  Recent Labs Lab 09/24/15 0509  INR 1.33    Cardiac Enzymes No results for input(s): CKMB, TROPONINI, MYOGLOBIN in the last 168 hours.  Invalid input(s): CK  Invalid input(s): POCBNP No results for input(s): DDIMER in the last 72 hours. No results for input(s): HGBA1C in  the last 72 hours. No results for input(s): CHOL, HDL, LDLCALC, TRIG, CHOLHDL, LDLDIRECT in the last 72 hours. No results for input(s): TSH, T4TOTAL, T3FREE, THYROIDAB in the last 72 hours.  Invalid input(s): FREET3 No results for input(s): VITAMINB12, FOLATE, FERRITIN, TIBC, IRON, RETICCTPCT in the last 72 hours. No results for input(s): LIPASE, AMYLASE in the last 72 hours.  Urine Studies No results for input(s): UHGB, CRYS in the last 72 hours.  Invalid input(s): UACOL, UAPR, USPG, UPH, UTP, UGL, UKET, UBIL, UNIT, UROB, ULEU, UEPI, UWBC, URBC, UBAC, CAST, UCOM, BILUA  MICROBIOLOGY: Recent Results (from the past 240 hour(s))  Culture, body fluid-bottle     Status: None   Collection Time: 09/22/15  2:21 PM  Result Value Ref Range Status   Specimen Description FLUID PLEURAL  Final   Special Requests BOTTLES DRAWN AEROBIC AND ANAEROBIC [place]  Final   Culture NO GROWTH 5 DAYS  Final   Report Status 09/27/2015 FINAL  Final  Gram stain     Status: None (Preliminary result)   Collection Time: 09/22/15  2:25 PM  Result Value Ref Range Status   Specimen Description FLUID  Final   Special Requests NONE  Final   Gram Stain NO ORGANISMS SEEN PERFORMED AT [facility]   Final   Report Status PENDING  Incomplete  Surgical pcr screen     Status: None   Collection Time: 09/24/15 12:15 AM  Result Value Ref Range Status   MRSA, PCR NEGATIVE NEGATIVE Final   Staphylococcus aureus NEGATIVE NEGATIVE Final    Comment:        The Xpert SA Assay (FDA approved for NASAL specimens in patients over 13 years of age), is one component of a comprehensive surveillance program.   Test performance has been validated by [facility] for patients greater than or equal to 45 year old. It is not intended to diagnose infection nor to guide or monitor treatment.   Culture, respiratory (NON-Expectorated)     Status: None   Collection Time: 09/24/15  9:00 AM  Result Value Ref Range Status   Specimen Description BRONCHIAL WASHINGS RIGHT  Final   Special Requests NONE  Final   Gram Stain   Final    FEW WBC PRESENT, PREDOMINANTLY PMN NO SQUAMOUS EPITHELIAL CELLS SEEN NO ORGANISMS SEEN Performed at [facility]    Culture   Final    NO GROWTH 2 DAYS Performed at [facility]    Report Status 09/26/2015 FINAL  Final  Culture, body fluid-bottle     Status: None (Preliminary result)   Collection Time: 09/24/15  9:43 AM  Result Value Ref Range Status   Specimen Description FLUID RIGHT PLEURAL  Final   Special Requests   Final    POF ZOSYN PART B BOTTLES DRAWN AEROBIC AND ANAEROBIC 10MLS   Culture NO GROWTH 3 DAYS  Final   Report Status PENDING  Incomplete  Gram stain     Status: None   Collection Time: 09/24/15  9:43 AM  Result Value Ref Range Status   Specimen Description FLUID RIGHT PLEURAL  Final   Special Requests POF ZOSYN PART B  Final   Gram Stain   Final    RARE WBC PRESENT,BOTH PMN AND MONONUCLEAR NO ORGANISMS SEEN    Report Status 09/24/2015 FINAL  Final  Culture, body fluid-bottle     Status: None (Preliminary result)   Collection Time: 09/24/15  9:50 AM  Result Value Ref Range Status   Specimen Description FLUID  RIGHT PLEURAL  Final   Special Requests POF ZOSYN PART C BOTTLES DRAWN AEROBIC ONLY 5MLS  Final   Culture NO GROWTH 3 DAYS  Final   Report Status PENDING  Incomplete  Gram stain     Status: None   Collection Time: 09/24/15  9:50 AM  Result Value Ref Range Status   Specimen Description FLUID RIGHT PLEURAL  Final   Special Requests POF ZOSYN PART C  Final   Gram Stain   Final    FEW WBC PRESENT,BOTH PMN AND MONONUCLEAR NO  ORGANISMS SEEN    Report Status 09/24/2015 FINAL  Final  Tissue culture     Status: None   Collection Time: 09/24/15  9:58 AM  Result Value Ref Range Status   Specimen Description TISSUE RIGHT LUNG  Final   Special Requests POF ZOSYN  Final   Gram Stain   Final    ABUNDANT WBC PRESENT,BOTH PMN AND MONONUCLEAR NO ORGANISMS SEEN Performed at [facility]    Culture   Final    NO GROWTH 3 DAYS Performed at [facility]    Report Status 09/27/2015 FINAL  Final  Tissue culture     Status: None   Collection Time: 09/24/15 10:09 AM  Result Value Ref Range Status   Specimen Description TISSUE RIGHT LUNG  Final   Special Requests RIGHT VISCERAL PEEL POF ZOSYN  Final   Gram Stain   Final    RARE WBC PRESENT, PREDOMINANTLY PMN NO ORGANISMS SEEN Performed at [facility]    Culture   Final    NO GROWTH 3 DAYS Performed at [facility]    Report Status 09/27/2015 FINAL  Final  Tissue culture     Status: None   Collection Time: 09/24/15 10:43 AM  Result Value Ref Range Status   Specimen Description TISSUE RIGHT LUNG  Final   Special Requests RIGHT PERIATAL PLEURA POF ZOSYN  Final   Gram Stain   Final    MODERATE WBC PRESENT,BOTH PMN AND MONONUCLEAR NO ORGANISMS SEEN Performed at [facility]    Culture   Final    NO GROWTH 3 DAYS Performed at [facility]    Report Status 09/27/2015 FINAL  Final    RADIOLOGY STUDIES/RESULTS: Dg Chest 1 View  09/22/2015  CLINICAL DATA:  Loculated RIGHT pleural effusion post thoracentesis EXAM: CHEST 1 VIEW COMPARISON:  CT chest 09/21/2015, chest radiograph 09/19/2015 FINDINGS: Enlargement of cardiac silhouette with pulmonary vascular congestion. Diffuse infiltrates question pulmonary edema. Moderate to large RIGHT pleural effusion, partially loculated by prior CT. No apical pneumothorax identified. Persistent atelectasis of RIGHT middle and RIGHT lower lobes as well as portion of RIGHT upper lobe.  Minimal LEFT basilar atelectasis. Several foci of gas at the mid RIGHT lung and at the RIGHT base are identified, question aerated lung versus potentially small foci of loculated pneumothorax post thoracentesis. Minimal degenerative disc disease changes thoracic spine. IMPRESSION: Pulmonary edema. Persistent moderate to large loculated RIGHT pleural effusion with extensive atelectasis of the mid to lower RIGHT lung. Several foci of gas noted in the RIGHT mid lung and at the lower RIGHT chest question foci of aerated lung versus small foci of loculated pneumothorax post RIGHT thoracentesis. Findings called to Dr. Luan Pulling on 09/22/2015 at 1430 hours. Electronically Signed   By: Lavonia Dana M.D.   On: 09/22/2015 14:33   Ct Angio Chest Pe W/cm &/or Wo Cm  09/21/2015  CLINICAL DATA:  Persistent hypoxia. Decreased oxygen saturation  with increasing oxygen requirement. EXAM: CT ANGIOGRAPHY CHEST WITH CONTRAST TECHNIQUE: Multidetector CT imaging of the chest was performed using the standard protocol during bolus administration of intravenous contrast. Multiplanar CT image reconstructions and MIPs were obtained to evaluate the vascular anatomy. CONTRAST:  165mL OMNIPAQUE IOHEXOL 350 MG/ML SOLN COMPARISON:  CT PE protocol 09/19/2015, 2 days prior FINDINGS: There are no filling defects within the pulmonary arteries to suggest pulmonary embolus. Limited assessment of the lower lobe branches secondary to breathing motion artifact and consolidation on the right. Progressive consolidation and pleural effusion in the right hemithorax. Marked increasing in right pleural effusion, now moderate to large in circumferential. Consolidation in the right lower lobe concerning for pneumonia, with heterogeneous enhancement posteriorly. Small focus of air in the periphery of the heterogeneity and may reflect developing abscess. Complete atelectasis or consolidation throughout the right middle lobe. Compressive atelectasis in the right upper  lobe, with minimal ground-glass opacity in the aerated right upper lung. Scattered atelectasis and ground-glass opacities in the left lung. No left pleural effusion. Thoracic aorta is normal in caliber. There is no pericardial effusion. Development of mediastinal adenopathy in the interim is likely reactive. Previous prominent right hilar lymph node is obscured. Evaluation of the upper abdomen demonstrates no acute abnormality. There are no acute or suspicious osseous abnormalities. Review of the MIP images confirms the above findings. IMPRESSION: 1. No pulmonary embolus. 2. Marked progression in right pleural effusion, now moderate to large and circumferential. Consolidation in the right lower lobe with heterogeneity consistent with pneumonia, small focus of air in the periphery of the right lower lobe may reflect developing abscess. Complete consolidation throughout the right middle lobe, additional pneumonia versus compressive atelectasis. 3. Development of mediastinal adenopathy, likely reactive. Electronically Signed   By: Jeb Levering M.D.   On: 09/21/2015 21:34   Ct Angio Chest Pe W/cm &/or Wo Cm  09/19/2015  CLINICAL DATA:  Awoke with right-sided chest pain. Elevated D-dimer. EXAM: CT ANGIOGRAPHY CHEST WITH CONTRAST TECHNIQUE: Multidetector CT imaging of the chest was performed using the standard protocol during bolus administration of intravenous contrast. Multiplanar CT image reconstructions and MIPs were obtained to evaluate the vascular anatomy. CONTRAST:  156mL OMNIPAQUE IOHEXOL 350 MG/ML SOLN COMPARISON:  Radiograph earlier this day. FINDINGS: No filling defects in the central pulmonary arteries. Breathing motion artifact, soft tissue attenuation and contrast bolus timing partially limits evaluation of the distal branches. Thoracic aorta is normal in caliber. No evidence of dissection. No pericardial effusion. Prominent right hilar lymph node measures 10 mm short axis. No mediastinal adenopathy.  Dense consolidation in the right lower lobe with air bronchograms consistent with pneumonia. Small adjacent right pleural effusion. Scattered linear opacities throughout both lungs consistent with atelectasis. No left pleural effusion. Breathing motion artifact partially obscures detailed evaluation. Evaluation of the upper abdomen demonstrates no acute abnormalities. There are no acute or suspicious osseous abnormalities. Motion artifact through the sternum. Postsurgical change in the cervical spine, partially included. Review of the MIP images confirms the above findings. IMPRESSION: 1. No central pulmonary embolus. 2. Dense right lower lobe consolidation consistent with pneumonia. Small right pleural effusion. Followup PA and lateral chest X-ray is recommended in 3-4 weeks following trial of antibiotic therapy to ensure resolution and exclude underlying malignancy. 3. Scattered atelectasis throughout both lungs. Breathing motion artifact partially obscures detailed evaluation. Electronically Signed   By: Jeb Levering M.D.   On: 09/19/2015 03:31   Dg Chest Port 1 View  09/28/2015  CLINICAL DATA:  Empyema  of lung, asthma, COPD EXAM: PORTABLE CHEST 1 VIEW COMPARISON:  Portable exam 0722 hours compared to 09/27/2015 FINDINGS: RIGHT arm PICC line tip projects over SVC near cavoatrial junction. RIGHT thoracostomy tube unchanged. Stable heart size and mediastinal contours. Persistent RIGHT pleural effusion and basilar atelectasis. Lungs otherwise clear. No pneumothorax. Prior cervical spine fusion. IMPRESSION: Persistent RIGHT pleural effusion and basilar atelectasis. RIGHT thoracostomy tube without definite pneumothorax. Electronically Signed   By: Lavonia Dana M.D.   On: 09/28/2015 10:24   Dg Chest Port 1 View  09/27/2015  CLINICAL DATA:  Empyema.  Followup exam. EXAM: PORTABLE CHEST 1 VIEW COMPARISON:  09/26/2015 FINDINGS: Patchy airspace opacity in the right mid lung is similar to the prior exam allowing  for differences in technique and patient positioning. The inferior of the 2 right-sided chest tubes has been removed. There is some linear up and hazy opacity at the right lung base most of which is likely atelectasis. Mild pleural thickening along the right mid to lower hemi thorax is stable. There are no new areas of lung opacification. Left lung is clear. No pneumothorax. Right PICC is stable. IMPRESSION: 1. Status post removal of 1 of the 2 right chest tubes. No pneumothorax. 2. No other change from the prior exam. Electronically Signed   By: Lajean Manes M.D.   On: 09/27/2015 09:17   Dg Chest Port 1 View  09/26/2015  CLINICAL DATA:  Pulmonary empyema. EXAM: PORTABLE CHEST 1 VIEW COMPARISON:  09/25/2015 FINDINGS: Abnormal peripheral airspace opacity and/ or pleural thickening in the right mid lung with blunting of the right lateral costophrenic angle. Right PICC line tip: SVC. Right chest tubes remain in place, unchanged position. No pneumothorax identified. Equivocal blunting of the left lateral costophrenic angle. Thoracic spondylosis.  Heart size normal. IMPRESSION: 1. Pleural thickening and potential adjacent airspace opacity in the right mid lung and right lung base. Right chest tubes are in place. Overall appearance stable. Electronically Signed   By: Van Clines M.D.   On: 09/26/2015 08:12   Dg Chest Port 1 View  09/25/2015  CLINICAL DATA:  Respiratory failure, pneumonia, empyema with chest tube drainage EXAM: PORTABLE CHEST 1 VIEW COMPARISON:  Portable chest x-ray of January and CT scan of the chest of September 21, 2015. FINDINGS: The less than 5% right apical pneumothorax remains faintly visible. There is persistent increased density peripherally in the right mid lung and at the right lung base. The 2 chest tubes are in stable position. The left lung is adequately inflated. There is minimal basilar atelectasis and trace of pleural fluid on the left. The cardiac silhouette is top-normal in  size. The pulmonary vascularity is not engorged. The bony thorax exhibits no acute abnormality. IMPRESSION: There has not been dramatic interval change since the study of September 24, 2015. Slight improvement in pulmonary interstitial edema on the right has occurred. There is persistent less than 5% right apical pneumothorax. The support tubes are in reasonable position. Electronically Signed   By: David  Martinique M.D.   On: 09/25/2015 08:00   Dg Chest Port 1 View  09/24/2015  CLINICAL DATA:  Right empyema. Community acquired pneumonia. Acute respiratory failure with hypoxia. Status post VATS. EXAM: PORTABLE CHEST 1 VIEW COMPARISON:  09/24/2015 FINDINGS: Right chest tubes and right arm PICC line remain in place. A tiny less than 5% right apical pneumothorax remains stable. Small right pleural effusion and right lower lung infiltrate or atelectasis is unchanged. Mild atelectasis in left lung base is also stable.  IMPRESSION: Stable tiny less than 5% right apical pneumothorax. Right greater than left lower lung infiltrate or atelectasis and small pleural effusions without significant change. Electronically Signed   By: Earle Gell M.D.   On: 09/24/2015 17:17   Dg Chest Port 1 View  09/24/2015  CLINICAL DATA:  Status post right lung surgery with chest tube placement. EXAM: PORTABLE CHEST 1 VIEW COMPARISON:  Chest x-ray 05/01/2016 FINDINGS: Status post evacuation of a large complex right pleural effusion with 2 chest tubes in place. Tiny residual pleural effusion and small apical pneumothorax. Right lung demonstrates mild edema and atelectasis. The left lung is stable. Minimal left basilar atelectasis. A right PICC line is in good position with its tip in the distal SVC near the cavoatrial junction. IMPRESSION: Status post evacuation of a large complex right pleural effusion with 2 chest tubes in place. There is a small apical pneumothorax. Mild edema and atelectasis. Electronically Signed   By: Marijo Sanes M.D.    On: 09/24/2015 13:43   Dg Chest Portable 1 View  09/19/2015  CLINICAL DATA:  Acute onset of stabbing right rib cage pain. Initial encounter. EXAM: PORTABLE CHEST 1 VIEW COMPARISON:  Chest radiograph performed 04/08/2009 FINDINGS: The lungs are mildly hypoexpanded. Bibasilar airspace opacities, right greater than left, may reflect pneumonia or pulmonary edema. Underlying vascular congestion is noted. A small right pleural effusion is suspected. No pneumothorax is seen. The cardiomediastinal silhouette is borderline normal in size. No acute osseous abnormalities are seen. IMPRESSION: Lungs mildly hypoexpanded. Vascular congestion noted. Bibasilar airspace opacities, right greater than left, may reflect pneumonia or pulmonary edema. Suspect small right pleural effusion. Electronically Signed   By: Garald Balding M.D.   On: 09/19/2015 02:16   US Thoracentesis Asp Pleural Space W/img Guide  09/22/2015  CLINICAL DATA:  RIGHT pleural effusion EXAM: ULTRASOUND GUIDED DIAGNOSTIC AND THERAPEUTIC THORACENTESIS COMPARISON:  CT chest 09/21/2015 PROCEDURE: Procedure, benefits, and risks of procedure were discussed with patient. Written informed consent for procedure was obtained. Time out protocol followed. Pleural effusion localized by ultrasound at the posterior RIGHT hemithorax. Pleural effusion is complex, containing multiple scattered areas of internal echogenicity with multiple loculations/septations. Skin prepped and draped in usual sterile fashion. Skin and soft tissues anesthetized with 10 mL of 1% lidocaine. 8 French thoracentesis catheter placed into the RIGHT pleural space. 33 mL of serosanguineous fluid aspirated by syringe pump. Procedure tolerated well by patient without immediate complication. COMPLICATIONS: No immediate complications ; post procedural chest radiograph reported separately. FINDINGS: A total of approximately 33 mL of RIGHT pleural fluid was removed. Entire specimen was sent for requested  laboratory analysis. IMPRESSION: Successful ultrasound guided RIGHT thoracentesis yielding 33 mL of pleural fluid. Sonographically the RIGHT pleural effusion appears complex and multiloculated. Findings called to Dr.  Luan Pulling on 09/22/2015 at 1430 hours. Electronically Signed   By: Lavonia Dana M.D.   On: 09/22/2015 14:38    Oren Binet, MD  [facility] Pager:[phone]  If 7PM-7AM, please contact night-coverage [email or web address] Password [email or web address] 09/28/2015, 12:01 PM   LOS: 9 days

## 2015-09-28 NOTE — Progress Notes (Signed)
Physical Therapy Treatment Patient Details Name: [name] [name] MRN: [id number] DOB: 1961/06/20 Today's Date: 09/28/2015    History of Present Illness Pt is a 55 y/o F admitted on 09/19/15 for Rt empyema and is not s/p VATS/decortication.  Pt's PMH includes COPD, Rt LE nerve damage, fibromyalgia, Bil carpal tunnel syndrome, back/neck ? surgery.    PT Comments    Patient is functioning at supervision level with mobility and gait.  Was able to negotiate stairs with min guard assist.  Patient has achieved all PT goals - PT will sign off.  Patient ready for d/c from PT perspective. Will need RW for home use prior to discharge.   Follow Up Recommendations  No PT follow up;Supervision for mobility/OOB     Equipment Recommendations  Rolling walker with 5" wheels    Recommendations for Other Services       Precautions / Restrictions Precautions Precautions: Fall Restrictions Weight Bearing Restrictions: No    Mobility  Bed Mobility Overal bed mobility: Needs Assistance Bed Mobility: Rolling;Sidelying to Sit;Sit to Sidelying Rolling: Modified independent (Device/Increase time) Sidelying to sit: Supervision     Sit to sidelying: Min guard General bed mobility comments: Assist for safety.  Patient able to perform bed mobility with increased time only.  Transfers Overall transfer level: Needs assistance Equipment used: Rolling walker (2 wheeled) Transfers: Sit to/from Stand Sit to Stand: Min guard         General transfer comment: Assist for balance/safety.  Ambulation/Gait Ambulation/Gait assistance: Supervision Ambulation Distance (Feet): 250 Feet Assistive device: Rolling walker (2 wheeled) Gait Pattern/deviations: Step-through pattern;Decreased stride length Gait velocity: decreased Gait velocity interpretation: Below normal speed for age/gender General Gait Details: Patient demonstrates safe use of RW.  Assist for safety only.   Stairs Stairs: Yes Stairs  assistance: Min guard Stair Management: Two rails;Step to pattern;Forwards Number of Stairs: 3 General stair comments: Instructed patient on step-to pattern to negotiate stairs.  Instructed significant other in proper guarding technique to assist patient on stairs.  Able to complete with min guard assist.  Wheelchair Mobility    Modified Rankin (Stroke Patients Only)       Balance     Sitting balance-Leahy Scale: Good       Standing balance-Leahy Scale: Fair                      Cognition Arousal/Alertness: Awake/alert Behavior During Therapy: WFL for tasks assessed/performed Overall Cognitive Status: Within Functional Limits for tasks assessed                      Exercises Shoulder Exercises Shoulder Flexion:  (limited to 90 deg due to chest tube)    General Comments        Pertinent Vitals/Pain Pain Assessment: 0-10 Pain Score: 2  Pain Location: back Pain Descriptors / Indicators: Aching Pain Intervention(s): Monitored during session;Repositioned    Home Living Family/patient expects to be discharged to:: Private residence Living Arrangements: Spouse/significant other Available Help at Discharge: Friend(s);Available 24 hours/day (partner, Beverlee Nims) Type of Home: House Home Access: Stairs to enter Entrance Stairs-Rails: Left;Right;Can reach both Home Layout: One level Home Equipment: Cane - single point      Prior Function Level of Independence: Independent with assistive device(s)      Comments: Would use cane only when her chronic back pain would flare up   PT Goals (current goals can now be found in the care plan section) Acute Rehab PT Goals Patient Stated  Goal: to go home PT Goal Formulation: All assessment and education complete, DC therapy Progress towards PT goals: Goals met/education completed, patient discharged from PT    Frequency  Min 3X/week    PT Plan Current plan remains appropriate    Co-evaluation              End of Session Equipment Utilized During Treatment: Gait belt Activity Tolerance: Patient tolerated treatment well Patient left: in bed;with call bell/phone within reach;with family/visitor present     Time: 6503-5465 PT Time Calculation (min) (ACUTE ONLY): 13 min  Charges:  $Gait Training: 8-22 mins                    G Codes:      [name] [name] 2015/10/02, 7:27 PM [name] [name], [name] [name] Pager [phone]

## 2015-09-28 NOTE — Progress Notes (Addendum)
      [address]       [place] [address]             [phone]      4 Days Post-Op Procedure(s) (LRB): VIDEO BRONCHOSCOPY (N/A) VIDEO ASSISTED THORACOSCOPY (VATS)/DECORTICATION (Right)   Subjective:  [name] [name] continues to feel pain.  Her pain is manageable and she thinks it will be much better once final chest tube is removed.  + ambulation  + BM  Objective: Vital signs in last 24 hours: Temp:  [97.6 F (36.4 C)-98.3 F (36.8 C)] 97.7 F (36.5 C) (01/15 0730) Pulse Rate:  [69-89] 77 (01/15 0725) Cardiac Rhythm:  [-] Normal sinus rhythm (01/15 0800) Resp:  [13-24] 20 (01/15 0842) BP: (110-137)/(62-88) 127/71 mmHg (01/15 0725) SpO2:  [95 %-98 %] 97 % (01/15 0842)  Intake/Output from previous day: 01/14 0701 - 01/15 0700 In: 841.5 [P.O.:840; I.V.:1.5] Out: 1800 [Urine:1800] Intake/Output this shift: Total I/O In: -  Out: 50 [Chest Tube:50]  General appearance: alert, cooperative and no distress Heart: regular rate and rhythm Lungs: diminished breath sounds on right Abdomen: soft, non-tender; bowel sounds normal; no masses,  no organomegaly Wound: clean and dry  Lab Results:  Recent Labs  09/26/15 0400  WBC 12.3*  HGB 12.0  HCT 38.6  PLT 352   BMET:  Recent Labs  09/26/15 0400 09/27/15 0612  NA 143 141  K 2.9* 3.6  CL 102 103  CO2 30 30  GLUCOSE 96 116*  BUN 7 9  CREATININE 0.62 0.68  CALCIUM 8.6* 8.8*    PT/INR: No results for input(s): LABPROT, INR in the last 72 hours. ABG    Component Value Date/Time   PHART 7.409 09/25/2015 0450   HCO3 30.7* 09/25/2015 0450   TCO2 32.2 09/25/2015 0450   O2SAT 95.2 09/25/2015 0450   CBG (last 3)  No results for input(s): GLUCAP in the last 72 hours.  Assessment/Plan: S/P Procedure(s) (LRB): VIDEO BRONCHOSCOPY (N/A) VIDEO ASSISTED THORACOSCOPY (VATS)/DECORTICATION (Right)  1. Chest tube- no air leak, minimal output, CXR with stable post operative changes- will d/c final chest  tube today 2. D/C PCA 3. ID- or cultures remain negative to date, on vanc and zosyn 4. Dispo- care per medicine, d/c final chest tube, d/c PCA, will repeat CXR in AM   LOS: 9 days    [name], [name] 09/28/2015  Patient looks great Operative cultures remain negative but will continue IV antibiotics we'll patient is in a hospital Chest tube out today and chest x-ray PA and lateral tomorrow  Tharon Aquas trigt M.D.

## 2015-09-28 NOTE — Progress Notes (Signed)
Occupational Therapy Evaluation Patient Details Name: [name] [name] MRN: [id number] DOB: April 26, 1961 Today's Date: 09/28/2015    History of Present Illness Pt is a 55 y/o F admitted on 09/19/15 for Rt empyema and is not s/p VATS/decortication.  Pt's PMH includes COPD, Rt LE nerve damage, fibromyalgia, Bil carpal tunnel syndrome, back/neck ? surgery.   Clinical Impression   PTA, pt mod I with ADLa nd mobility at [facility] level. Pt presents with deficits with ADL and mobility. Will plan to educate pt and her caregiver Beverlee Nims) tomorrow regarding use of tub bench and DME/compensatory technique and energy conservation  to increase safety, reduce risk of falls and maximize functional level of independence at D/C. Pt very appreciative.     Follow Up Recommendations  No OT follow up;Supervision/Assistance - 24 hour    Equipment Recommendations  Tub/shower bench;Other (comment) (RW)    Recommendations for Other Services       Precautions / Restrictions Precautions Precautions: Fall Restrictions Weight Bearing Restrictions: No      Mobility Bed Mobility Overal bed mobility: Needs Assistance;+ 2 for safety/equipment Bed Mobility: Rolling;Sit to Sidelying Rolling: Min guard       Sit to sidelying: Min assist;+2 for safety/equipment    Transfers Overall transfer level: Needs assistance Equipment used: Rolling walker (2 wheeled)   Sit to Stand: Min guard              Balance     Sitting balance-Leahy Scale: Good       Standing balance-Leahy Scale: Fair                              ADL Overall ADL's : Needs assistance/impaired     Grooming: Set up   Upper Body Bathing: Set up;Sitting   Lower Body Bathing: Minimal assistance;Sit to/from stand   Upper Body Dressing : Minimal assistance   Lower Body Dressing: Moderate assistance;Sit to/from stand   Toilet Transfer: Minimal assistance   Toileting- Clothing Manipulation and Hygiene: Minimal assistance       Functional mobility during ADLs: Minimal assistance General ADL Comments: discussed use of tub bench. interested in practicing. also interested in discussing use of AE for LB ADL. Will benefi tform Energy conservation Risk manager      Pertinent Vitals/Pain Pain Assessment: 0-10 Pain Score: 3  Pain Location: back Pain Descriptors / Indicators: Aching Pain Intervention(s): Limited activity within patient's tolerance     Hand Dominance     Extremity/Trunk Assessment Upper Extremity Assessment Upper Extremity Assessment: Generalized weakness   Lower Extremity Assessment Lower Extremity Assessment: Defer to PT evaluation   Cervical / Trunk Assessment Cervical / Trunk Assessment: Other exceptions (hx of back surgery) Cervical / Trunk Exceptions: pt maintains flexed posture due to chest tube, encouraged pt to maintain upright posture while sitting or standing to dec site stiffness and pain.   Communication Communication Communication: No difficulties   Cognition Arousal/Alertness: Awake/alert Behavior During Therapy: WFL for tasks assessed/performed Overall Cognitive Status: Within Functional Limits for tasks assessed                     General Comments       Exercises       Shoulder Instructions      Home Living Family/patient expects to be discharged to:: Private residence Living Arrangements: Spouse/significant other Available Help at Discharge: Friend(s);Available  24 hours/day (partner, Beverlee Nims) Type of Home: House Home Access: Stairs to enter [employer] of Steps: 4 Entrance Stairs-Rails: Left;Right;Can reach both Home Layout: One level     Bathroom Shower/Tub: Tub/shower unit Shower/tub characteristics: Architectural technologist: Handicapped height Bathroom Accessibility: Yes How Accessible: Accessible via walker Home Equipment: [place] - single point          Prior Functioning/Environment  Level of Independence: Independent with assistive device(s)        Comments: Would use cane only when her chronic back pain would flare up    OT Diagnosis: Generalized weakness;Acute pain   OT Problem List: Decreased strength;Decreased activity tolerance;Impaired balance (sitting and/or standing);Decreased safety awareness;Decreased knowledge of use of DME or AE;Cardiopulmonary status limiting activity;Pain   OT Treatment/Interventions: Self-care/ADL training;Therapeutic exercise;Energy conservation;DME and/or AE instruction;Therapeutic activities;Patient/family education    OT Goals(Current goals can be found in the care plan section) Acute Rehab OT Goals Patient Stated Goal: to go home OT Goal Formulation: With patient Time For Goal Achievement: 10/05/15 Potential to Achieve Goals: Good ADL Goals Pt Will Perform Lower Body Bathing: with caregiver independent in assisting;with min assist;with adaptive equipment;sit to/from stand Pt Will Perform Lower Body Dressing: with min assist;with caregiver independent in assisting;with adaptive equipment;sit to/from stand Pt Will Transfer to Toilet: with supervision;ambulating (with caregiver assisting) Pt Will Perform Tub/Shower Transfer: Tub transfer;rolling walker;ambulating;tub bench;with caregiver independent in assisting;with supervision Pt/caregiver will Perform Home Exercise Program: Increased strength;Both right and left upper extremity;With theraband;With written HEP provided (level 1) Additional ADL Goal #1: Pt will verbalize understanding of 3 energy conservation techniques for ADL  OT Frequency: Min 2X/week   Barriers to D/C:            Co-evaluation              End of Session Nurse Communication: Mobility status  Activity Tolerance: Patient tolerated treatment well Patient left: in bed;with call bell/phone within reach   Time: [id number] OT Time Calculation (min): 17 min Charges:  OT General Charges $OT Visit: 1  Procedure OT Evaluation $OT Eval Moderate Complexity: 1 Procedure G-Codes:    Iseah Plouff,HILLARY 09-29-15, 6:28 PM   [facility], OTR/L  [phone] 09-29-2015

## 2015-09-28 NOTE — Discharge Instructions (Signed)
Video Assisted Thoracoscopy, Care After °Refer to this sheet in the next few weeks. These instructions provide you with information on caring for yourself after your procedure. Your caregiver may also give you more specific instructions. Your procedure has been planned according to current medical practices, but problems sometimes occur. Call your caregiver if you have any problems or questions after your procedure. °HOME CARE INSTRUCTIONS  °· Only take over-the-counter or prescription medications as directed. °· Only take pain medications (narcotics) as directed. °· Do not drive until your caregiver approves. Driving while taking narcotics or soon after surgery can be dangerous, so discuss the specific timing with your caregiver. °· Avoid activities that use your chest muscles, such as lifting heavy objects, for at least 3-4 weeks.   °· Take deep breaths to expand the lungs and to protect against pneumonia. °· Do breathing exercises as directed by your caregiver. If you were given an incentive spirometer to help with breathing, use it as directed. °· You may resume a normal diet and activities when you feel you are able to or as directed. °· Do not take a bath until your caregiver says it is OK. Use the shower instead.   °· Keep the bandage (dressing) covering the area where the chest tube was inserted (incision site) dry for 48 hours. After 48 hours, remove the dressing unless there is new drainage. °· Remove dressings as directed by your caregiver. °· Change dressings if necessary or as directed. °· Keep all follow-up appointments. It is important for you to see your caregiver after surgery to discuss appropriate follow-up care and surveillance, if it is necessary. °SEEK MEDICAL CARE: °· You feel excessive or increasing pain at an incision site. °· You notice bleeding, skin irritation, drainage, swelling, or redness at an incision site. °· There is a bad smell coming from an incision or dressing. °· It feels like  your heart is fluttering or beating rapidly. °· Your pain medication does not relieve your pain. °SEEK IMMEDIATE MEDICAL CARE IF:  °· You have a fever.   °· You have chest pain.  °· You have a rash. °· You have shortness of breath. °· You have trouble breathing.   °· You feel weak, lightheaded, dizzy, or faint.   °MAKE SURE YOU:  °· Understand these instructions.   °· Will watch your condition.   °· Will get help right away if you are not doing well or get worse. °  °This information is not intended to replace advice given to you by your health care provider. Make sure you discuss any questions you have with your health care provider. °  °Document Released: 12/25/2012 Document Revised: 09/20/2014 Document Reviewed: 12/25/2012 °Elsevier Interactive Patient Education ©2016 [employer] ° °

## 2015-09-29 ENCOUNTER — Inpatient Hospital Stay (HOSPITAL_COMMUNITY): Payer: PPO

## 2015-09-29 DIAGNOSIS — E039 Hypothyroidism, unspecified: Secondary | ICD-10-CM | POA: Diagnosis not present

## 2015-09-29 DIAGNOSIS — J189 Pneumonia, unspecified organism: Secondary | ICD-10-CM | POA: Diagnosis not present

## 2015-09-29 DIAGNOSIS — J9601 Acute respiratory failure with hypoxia: Secondary | ICD-10-CM | POA: Diagnosis not present

## 2015-09-29 DIAGNOSIS — F17200 Nicotine dependence, unspecified, uncomplicated: Secondary | ICD-10-CM

## 2015-09-29 DIAGNOSIS — J948 Other specified pleural conditions: Secondary | ICD-10-CM

## 2015-09-29 DIAGNOSIS — J869 Pyothorax without fistula: Secondary | ICD-10-CM | POA: Diagnosis not present

## 2015-09-29 DIAGNOSIS — G894 Chronic pain syndrome: Secondary | ICD-10-CM | POA: Diagnosis not present

## 2015-09-29 DIAGNOSIS — Z4682 Encounter for fitting and adjustment of non-vascular catheter: Secondary | ICD-10-CM | POA: Diagnosis not present

## 2015-09-29 DIAGNOSIS — F418 Other specified anxiety disorders: Secondary | ICD-10-CM | POA: Diagnosis not present

## 2015-09-29 LAB — CULTURE, BODY FLUID W GRAM STAIN -BOTTLE
Culture: NO GROWTH
Culture: NO GROWTH

## 2015-09-29 MED ORDER — AMOXICILLIN-POT CLAVULANATE 875-125 MG PO TABS: 1.0000 | ORAL_TABLET | Freq: Two times a day (BID) | ORAL | Status: DC

## 2015-09-29 MED ORDER — TRAZODONE HCL 100 MG PO TABS: 100.0000 mg | ORAL_TABLET | Freq: Every evening | ORAL | Status: DC | PRN

## 2015-09-29 NOTE — Care Management Important Message (Signed)
Important Message  Patient Details  Name: [name] [name] MRN: [id number] Date of Birth: 10-Dec-1960   Medicare Important Message Given:  Yes    Loann Quill 09/29/2015, 12:41 PM

## 2015-09-29 NOTE — Progress Notes (Signed)
5 Days Post-Op Procedure(s) (LRB): VIDEO BRONCHOSCOPY (N/A) VIDEO ASSISTED THORACOSCOPY (VATS)/DECORTICATION (Right) Subjective: Still having a lot of incisional pain, but wants to go home  Objective: Vital signs in last 24 hours: Temp:  [97 F (36.1 C)-98.4 F (36.9 C)] 97.9 F (36.6 C) (01/16 0717) Pulse Rate:  [73-90] 83 (01/16 0240) Cardiac Rhythm:  [-] Normal sinus rhythm (01/16 0240) Resp:  [14-20] 15 (01/16 0240) BP: (100-143)/(66-85) 100/67 mmHg (01/16 0240) SpO2:  [93 %-97 %] 94 % (01/16 0240)  Hemodynamic parameters for last 24 hours:    Intake/Output from previous day: 01/15 0701 - 01/16 0700 In: 31 [P.O.:360; I.V.:60] Out: 700 [Urine:650; Chest Tube:50] Intake/Output this shift:    General appearance: alert, cooperative and no distress Neurologic: intact Heart: regular rate and rhythm Lungs: diminished breath sounds right base Wound: clean and dry  Lab Results: No results for input(s): WBC, HGB, HCT, PLT in the last 72 hours. BMET:  Recent Labs  09/27/15 0612  NA 141  K 3.6  CL 103  CO2 30  GLUCOSE 116*  BUN 9  CREATININE 0.68  CALCIUM 8.8*    PT/INR: No results for input(s): LABPROT, INR in the last 72 hours. ABG    Component Value Date/Time   PHART 7.409 09/25/2015 0450   HCO3 30.7* 09/25/2015 0450   TCO2 32.2 09/25/2015 0450   O2SAT 95.2 09/25/2015 0450   CBG (last 3)  No results for input(s): GLUCAP in the last 72 hours.  Assessment/Plan: S/P Procedure(s) (LRB): VIDEO BRONCHOSCOPY (N/A) VIDEO ASSISTED THORACOSCOPY (VATS)/DECORTICATION (Right) -  Doing well Chest xray continues to improve Pain control about as expected given chronic pain issues Afebrile on Augmentin OK to dc from a surgical standpoint   LOS: 10 days    Melrose Nakayama 09/29/2015

## 2015-09-29 NOTE — Care Management Note (Signed)
Case Management Note  Patient Details  Name: CORDY GOLL MRN: [id number] Date of Birth: Feb 14, 1961  Subjective/Objective:   Date: 09/29/15 Spoke with patient at the bedside.  Introduced self as Tourist information centre manager and explained role in discharge planning and how to be reached.  Verified patient lives in town, with her partner, who will be assisting her at home. Expressed potential need for no other DME.  Verified patient anticipates to go home with partner at time of discharge and will have full-time supervision by partner at this time to best of their knowledge. Patient denied needing help with their medication.  Patient is driven by her partner to MD appointments.  Verified patient has PCP [facility].  Patient states she does not need a rolling walker or a tub bench. She no longer has pluerx catherters, she will go to MD office at dc , just have three dry dressing intact.    Plan: CM will continue to follow for discharge planning and [facility] resources.                 Action/Plan:   Expected Discharge Date:                  Expected Discharge Plan:  Home/Self Care  In-House Referral:     Discharge planning Services  CM Consult  Post Acute Care Choice:    Choice offered to:  Patient  DME Arranged:    DME Agency:     HH Arranged:    [place] Agency:     Status of Service:  Completed, signed off  Medicare Important Message Given:  Yes Date Medicare IM Given:    Medicare IM give by:    Date Additional Medicare IM Given:    Additional Medicare Important Message give by:     If discussed at [place] of Stay Meetings, dates discussed:    Additional Comments:  Zenon Mayo, RN 09/29/2015, 10:51 AM

## 2015-09-29 NOTE — Discharge Summary (Signed)
PATIENT DETAILS Name: [name] [name] Age: 55 y.o. Sex: female Date of Birth: 1961-08-19 MRN: [id number]. Admitting Physician: Orvan Falconer, MD [id number], CAMMIE, MD  Admit Date: 09/19/2015 Discharge date: 09/29/2015  Recommendations for Outpatient Follow-up:  1. Follow pleural fluid cultures till final 2. Please repeat CBC/BMET at next visit  PRIMARY DISCHARGE DIAGNOSIS:  Principal Problem:   CAP (community acquired pneumonia) Active Problems:   Hypothyroidism   OBESITY NOS   TOBACCO ABUSE   Carpal tunnel syndrome   Chest pain on respiration   Chronic pain syndrome   Hypothyroidism, adult   Acute respiratory failure with hypoxia (HCC)   Depression with anxiety   Parapneumonic effusion   Empyema, right (HCC)      PAST MEDICAL HISTORY: Past Medical History  Diagnosis Date  . Asthma   . COPD (chronic obstructive pulmonary disease) ([place])   . Emphysema of lung ([place])   . GERD (gastroesophageal reflux disease)   . Thyroid disease     hypo thyroid  . Nerve damage     right leg  . Neuromuscular disorder (HCC)     fibromyalgia  . Anxiety   . Carpal tunnel syndrome 01/13/2015    Bilateral    DISCHARGE MEDICATIONS: Current Discharge Medication List    START taking these medications   Details  amoxicillin-clavulanate (AUGMENTIN) 875-125 MG tablet Take 1 tablet by mouth every 12 (twelve) hours. Qty: 10 tablet, Refills: 0      CONTINUE these medications which have CHANGED   Details  traZODone (DESYREL) 100 MG tablet Take 1 tablet (100 mg total) by mouth at bedtime as needed for sleep.      CONTINUE these medications which have NOT CHANGED   Details  buPROPion (WELLBUTRIN SR) 200 MG 12 hr tablet Take 200 mg by mouth 2 (two) times daily.    gabapentin (NEURONTIN) 600 MG tablet Take 600 mg by mouth 3 (three) times daily.    levothyroxine (SYNTHROID, LEVOTHROID) 112 MCG tablet Take 112 mcg by mouth daily before breakfast.    morphine (MS CONTIN) 60 MG 12 hr tablet Take  60 mg by mouth 3 (three) times daily.    morphine (MSIR) 15 MG tablet Take 15 mg by mouth every 4 (four) hours as needed for severe pain.    sertraline (ZOLOFT) 100 MG tablet Take 100 mg by mouth daily.    tizanidine (ZANAFLEX) 2 MG capsule Take 2 mg by mouth 3 (three) times daily.      STOP taking these medications     diazepam (VALIUM) 5 MG tablet         ALLERGIES:   Allergies  Allergen Reactions  . Celebrex [Celecoxib] Other (See Comments)    Severe epigastric pain  . Nsaids Other (See Comments)    SEVERE EPIGASTRIC PAIN  . Percocet [Oxycodone-Acetaminophen] Itching    BRIEF HPI:  See H&P, Labs, Consult and Test reports for all details in brief, patient yo woman with a history of chronic back pain, asthmas/ COPD, hypothyroidism and GERD-was admitted for evaluation of SOB/Cough. Further evaluation revealede PNA with empyema.  CONSULTATIONS:   pulmonary/intensive care and CTVS  PERTINENT RADIOLOGIC STUDIES: Dg Chest 1 View  09/22/2015  CLINICAL DATA:  Loculated RIGHT pleural effusion post thoracentesis EXAM: CHEST 1 VIEW COMPARISON:  CT chest 09/21/2015, chest radiograph 09/19/2015 FINDINGS: Enlargement of cardiac silhouette with pulmonary vascular congestion. Diffuse infiltrates question pulmonary edema. Moderate to large RIGHT pleural effusion, partially loculated by prior CT. No apical pneumothorax identified. Persistent atelectasis of RIGHT  middle and RIGHT lower lobes as well as portion of RIGHT upper lobe. Minimal LEFT basilar atelectasis. Several foci of gas at the mid RIGHT lung and at the RIGHT base are identified, question aerated lung versus potentially small foci of loculated pneumothorax post thoracentesis. Minimal degenerative disc disease changes thoracic spine. IMPRESSION: Pulmonary edema. Persistent moderate to large loculated RIGHT pleural effusion with extensive atelectasis of the mid to lower RIGHT lung. Several foci of gas noted in the RIGHT mid lung and at  the lower RIGHT chest question foci of aerated lung versus small foci of loculated pneumothorax post RIGHT thoracentesis. Findings called to Dr. Luan Pulling on 09/22/2015 at 1430 hours. Electronically Signed   By: Lavonia Dana M.D.   On: 09/22/2015 14:33   Dg Chest 2 View  09/29/2015  CLINICAL DATA:  Chest tube removal. EXAM: CHEST  2 VIEW COMPARISON:  09/28/2015. FINDINGS: Right PICC line in stable position. Mediastinum hilar structures are stable. Right mid lung and right lower lobe atelectasis and/or infiltrates again noted. Small right pleural effusion. No pneumothorax. Heart size stable. Cervical spine fusion. IMPRESSION: 1. Right PICC line stable position. 2. Persistent unchanged right mid lung and right lower lobe mild atelectasis and or infiltrate. Small right pleural effusion again noted. No pneumothorax. Electronically Signed   By: Marcello Moores  Register   On: 09/29/2015 08:06   Ct Angio Chest Pe W/cm &/or Wo Cm  09/21/2015  CLINICAL DATA:  Persistent hypoxia. Decreased oxygen saturation with increasing oxygen requirement. EXAM: CT ANGIOGRAPHY CHEST WITH CONTRAST TECHNIQUE: Multidetector CT imaging of the chest was performed using the standard protocol during bolus administration of intravenous contrast. Multiplanar CT image reconstructions and MIPs were obtained to evaluate the vascular anatomy. CONTRAST:  155mL OMNIPAQUE IOHEXOL 350 MG/ML SOLN COMPARISON:  CT PE protocol 09/19/2015, 2 days prior FINDINGS: There are no filling defects within the pulmonary arteries to suggest pulmonary embolus. Limited assessment of the lower lobe branches secondary to breathing motion artifact and consolidation on the right. Progressive consolidation and pleural effusion in the right hemithorax. Marked increasing in right pleural effusion, now moderate to large in circumferential. Consolidation in the right lower lobe concerning for pneumonia, with heterogeneous enhancement posteriorly. Small focus of air in the periphery of  the heterogeneity and may reflect developing abscess. Complete atelectasis or consolidation throughout the right middle lobe. Compressive atelectasis in the right upper lobe, with minimal ground-glass opacity in the aerated right upper lung. Scattered atelectasis and ground-glass opacities in the left lung. No left pleural effusion. Thoracic aorta is normal in caliber. There is no pericardial effusion. Development of mediastinal adenopathy in the interim is likely reactive. Previous prominent right hilar lymph node is obscured. Evaluation of the upper abdomen demonstrates no acute abnormality. There are no acute or suspicious osseous abnormalities. Review of the MIP images confirms the above findings. IMPRESSION: 1. No pulmonary embolus. 2. Marked progression in right pleural effusion, now moderate to large and circumferential. Consolidation in the right lower lobe with heterogeneity consistent with pneumonia, small focus of air in the periphery of the right lower lobe may reflect developing abscess. Complete consolidation throughout the right middle lobe, additional pneumonia versus compressive atelectasis. 3. Development of mediastinal adenopathy, likely reactive. Electronically Signed   By: Jeb Levering M.D.   On: 09/21/2015 21:34   Ct Angio Chest Pe W/cm &/or Wo Cm  09/19/2015  CLINICAL DATA:  Awoke with right-sided chest pain. Elevated D-dimer. EXAM: CT ANGIOGRAPHY CHEST WITH CONTRAST TECHNIQUE: Multidetector CT imaging of the chest was  performed using the standard protocol during bolus administration of intravenous contrast. Multiplanar CT image reconstructions and MIPs were obtained to evaluate the vascular anatomy. CONTRAST:  186mL OMNIPAQUE IOHEXOL 350 MG/ML SOLN COMPARISON:  Radiograph earlier this day. FINDINGS: No filling defects in the central pulmonary arteries. Breathing motion artifact, soft tissue attenuation and contrast bolus timing partially limits evaluation of the distal branches.  Thoracic aorta is normal in caliber. No evidence of dissection. No pericardial effusion. Prominent right hilar lymph node measures 10 mm short axis. No mediastinal adenopathy. Dense consolidation in the right lower lobe with air bronchograms consistent with pneumonia. Small adjacent right pleural effusion. Scattered linear opacities throughout both lungs consistent with atelectasis. No left pleural effusion. Breathing motion artifact partially obscures detailed evaluation. Evaluation of the upper abdomen demonstrates no acute abnormalities. There are no acute or suspicious osseous abnormalities. Motion artifact through the sternum. Postsurgical change in the cervical spine, partially included. Review of the MIP images confirms the above findings. IMPRESSION: 1. No central pulmonary embolus. 2. Dense right lower lobe consolidation consistent with pneumonia. Small right pleural effusion. Followup PA and lateral chest X-ray is recommended in 3-4 weeks following trial of antibiotic therapy to ensure resolution and exclude underlying malignancy. 3. Scattered atelectasis throughout both lungs. Breathing motion artifact partially obscures detailed evaluation. Electronically Signed   By: Jeb Levering M.D.   On: 09/19/2015 03:31   Dg Chest Port 1 View  09/28/2015  CLINICAL DATA:  Empyema of lung, asthma, COPD EXAM: PORTABLE CHEST 1 VIEW COMPARISON:  Portable exam 0722 hours compared to 09/27/2015 FINDINGS: RIGHT arm PICC line tip projects over SVC near cavoatrial junction. RIGHT thoracostomy tube unchanged. Stable heart size and mediastinal contours. Persistent RIGHT pleural effusion and basilar atelectasis. Lungs otherwise clear. No pneumothorax. Prior cervical spine fusion. IMPRESSION: Persistent RIGHT pleural effusion and basilar atelectasis. RIGHT thoracostomy tube without definite pneumothorax. Electronically Signed   By: Lavonia Dana M.D.   On: 09/28/2015 10:24   Dg Chest Port 1 View  09/27/2015  CLINICAL  DATA:  Empyema.  Followup exam. EXAM: PORTABLE CHEST 1 VIEW COMPARISON:  09/26/2015 FINDINGS: Patchy airspace opacity in the right mid lung is similar to the prior exam allowing for differences in technique and patient positioning. The inferior of the 2 right-sided chest tubes has been removed. There is some linear up and hazy opacity at the right lung base most of which is likely atelectasis. Mild pleural thickening along the right mid to lower hemi thorax is stable. There are no new areas of lung opacification. Left lung is clear. No pneumothorax. Right PICC is stable. IMPRESSION: 1. Status post removal of 1 of the 2 right chest tubes. No pneumothorax. 2. No other change from the prior exam. Electronically Signed   By: Lajean Manes M.D.   On: 09/27/2015 09:17   Dg Chest Port 1 View  09/26/2015  CLINICAL DATA:  Pulmonary empyema. EXAM: PORTABLE CHEST 1 VIEW COMPARISON:  09/25/2015 FINDINGS: Abnormal peripheral airspace opacity and/ or pleural thickening in the right mid lung with blunting of the right lateral costophrenic angle. Right PICC line tip: SVC. Right chest tubes remain in place, unchanged position. No pneumothorax identified. Equivocal blunting of the left lateral costophrenic angle. Thoracic spondylosis.  Heart size normal. IMPRESSION: 1. Pleural thickening and potential adjacent airspace opacity in the right mid lung and right lung base. Right chest tubes are in place. Overall appearance stable. Electronically Signed   By: Van Clines M.D.   On: 09/26/2015 08:12  Dg Chest Port 1 View  09/25/2015  CLINICAL DATA:  Respiratory failure, pneumonia, empyema with chest tube drainage EXAM: PORTABLE CHEST 1 VIEW COMPARISON:  Portable chest x-ray of January and CT scan of the chest of September 21, 2015. FINDINGS: The less than 5% right apical pneumothorax remains faintly visible. There is persistent increased density peripherally in the right mid lung and at the right lung base. The 2 chest tubes are  in stable position. The left lung is adequately inflated. There is minimal basilar atelectasis and trace of pleural fluid on the left. The cardiac silhouette is top-normal in size. The pulmonary vascularity is not engorged. The bony thorax exhibits no acute abnormality. IMPRESSION: There has not been dramatic interval change since the study of September 24, 2015. Slight improvement in pulmonary interstitial edema on the right has occurred. There is persistent less than 5% right apical pneumothorax. The support tubes are in reasonable position. Electronically Signed   By: David  Martinique M.D.   On: 09/25/2015 08:00   Dg Chest Port 1 View  09/24/2015  CLINICAL DATA:  Right empyema. Community acquired pneumonia. Acute respiratory failure with hypoxia. Status post VATS. EXAM: PORTABLE CHEST 1 VIEW COMPARISON:  09/24/2015 FINDINGS: Right chest tubes and right arm PICC line remain in place. A tiny less than 5% right apical pneumothorax remains stable. Small right pleural effusion and right lower lung infiltrate or atelectasis is unchanged. Mild atelectasis in left lung base is also stable. IMPRESSION: Stable tiny less than 5% right apical pneumothorax. Right greater than left lower lung infiltrate or atelectasis and small pleural effusions without significant change. Electronically Signed   By: Earle Gell M.D.   On: 09/24/2015 17:17   Dg Chest Port 1 View  09/24/2015  CLINICAL DATA:  Status post right lung surgery with chest tube placement. EXAM: PORTABLE CHEST 1 VIEW COMPARISON:  Chest x-ray 05/01/2016 FINDINGS: Status post evacuation of a large complex right pleural effusion with 2 chest tubes in place. Tiny residual pleural effusion and small apical pneumothorax. Right lung demonstrates mild edema and atelectasis. The left lung is stable. Minimal left basilar atelectasis. A right PICC line is in good position with its tip in the distal SVC near the cavoatrial junction. IMPRESSION: Status post evacuation of a large  complex right pleural effusion with 2 chest tubes in place. There is a small apical pneumothorax. Mild edema and atelectasis. Electronically Signed   By: Marijo Sanes M.D.   On: 09/24/2015 13:43   Dg Chest Portable 1 View  09/19/2015  CLINICAL DATA:  Acute onset of stabbing right rib cage pain. Initial encounter. EXAM: PORTABLE CHEST 1 VIEW COMPARISON:  Chest radiograph performed 04/08/2009 FINDINGS: The lungs are mildly hypoexpanded. Bibasilar airspace opacities, right greater than left, may reflect pneumonia or pulmonary edema. Underlying vascular congestion is noted. A small right pleural effusion is suspected. No pneumothorax is seen. The cardiomediastinal silhouette is borderline normal in size. No acute osseous abnormalities are seen. IMPRESSION: Lungs mildly hypoexpanded. Vascular congestion noted. Bibasilar airspace opacities, right greater than left, may reflect pneumonia or pulmonary edema. Suspect small right pleural effusion. Electronically Signed   By: Garald Balding M.D.   On: 09/19/2015 02:16   US Thoracentesis Asp Pleural Space W/img Guide  09/22/2015  CLINICAL DATA:  RIGHT pleural effusion EXAM: ULTRASOUND GUIDED DIAGNOSTIC AND THERAPEUTIC THORACENTESIS COMPARISON:  CT chest 09/21/2015 PROCEDURE: Procedure, benefits, and risks of procedure were discussed with patient. Written informed consent for procedure was obtained. Time out protocol followed. Pleural effusion  localized by ultrasound at the posterior RIGHT hemithorax. Pleural effusion is complex, containing multiple scattered areas of internal echogenicity with multiple loculations/septations. Skin prepped and draped in usual sterile fashion. Skin and soft tissues anesthetized with 10 mL of 1% lidocaine. 8 French thoracentesis catheter placed into the RIGHT pleural space. 33 mL of serosanguineous fluid aspirated by syringe pump. Procedure tolerated well by patient without immediate complication. COMPLICATIONS: No immediate complications ;  post procedural chest radiograph reported separately. FINDINGS: A total of approximately 33 mL of RIGHT pleural fluid was removed. Entire specimen was sent for requested laboratory analysis. IMPRESSION: Successful ultrasound guided RIGHT thoracentesis yielding 33 mL of pleural fluid. Sonographically the RIGHT pleural effusion appears complex and multiloculated. Findings called to Dr.  Luan Pulling on 09/22/2015 at 1430 hours. Electronically Signed   By: Lavonia Dana M.D.   On: 09/22/2015 14:38     PERTINENT LAB RESULTS: CBC: No results for input(s): WBC, HGB, HCT, PLT in the last 72 hours. CMET CMP     Component Value Date/Time   NA 141 09/27/2015 0612   K 3.6 09/27/2015 0612   CL 103 09/27/2015 0612   CO2 30 09/27/2015 0612   GLUCOSE 116* 09/27/2015 0612   BUN 9 09/27/2015 0612   CREATININE 0.68 09/27/2015 0612   CALCIUM 8.8* 09/27/2015 0612   PROT 5.7* 09/26/2015 0400   ALBUMIN 2.3* 09/26/2015 0400   AST 13* 09/26/2015 0400   ALT 22 09/26/2015 0400   ALKPHOS 62 09/26/2015 0400   BILITOT 0.5 09/26/2015 0400   GFRNONAA >60 09/27/2015 0612   GFRAA >60 09/27/2015 0612    GFR Estimated Creatinine Clearance: 100.1 mL/min (by C-G formula based on Cr of 0.68). No results for input(s): LIPASE, AMYLASE in the last 72 hours. No results for input(s): CKTOTAL, CKMB, CKMBINDEX, TROPONINI in the last 72 hours. Invalid input(s): POCBNP No results for input(s): DDIMER in the last 72 hours. No results for input(s): HGBA1C in the last 72 hours. No results for input(s): CHOL, HDL, LDLCALC, TRIG, CHOLHDL, LDLDIRECT in the last 72 hours. No results for input(s): TSH, T4TOTAL, T3FREE, THYROIDAB in the last 72 hours.  Invalid input(s): FREET3 No results for input(s): VITAMINB12, FOLATE, FERRITIN, TIBC, IRON, RETICCTPCT in the last 72 hours. Coags: No results for input(s): INR in the last 72 hours.  Invalid input(s): PT Microbiology: Recent Results (from the past 240 hour(s))  Culture, body  fluid-bottle     Status: None   Collection Time: 09/22/15  2:21 PM  Result Value Ref Range Status   Specimen Description FLUID PLEURAL  Final   Special Requests BOTTLES DRAWN AEROBIC AND ANAEROBIC 6CC  Final   Culture NO GROWTH 5 DAYS  Final   Report Status 09/27/2015 FINAL  Final  Gram stain     Status: None (Preliminary result)   Collection Time: 09/22/15  2:25 PM  Result Value Ref Range Status   Specimen Description FLUID  Final   Special Requests NONE  Final   Gram Stain NO ORGANISMS SEEN PERFORMED AT [facility]   Final   Report Status PENDING  Incomplete  Surgical pcr screen     Status: None   Collection Time: 09/24/15 12:15 AM  Result Value Ref Range Status   MRSA, PCR NEGATIVE NEGATIVE Final   Staphylococcus aureus NEGATIVE NEGATIVE Final    Comment:        The Xpert SA Assay (FDA approved for NASAL specimens in patients over 7 years of age), is one component of a comprehensive surveillance program.  Test performance has been validated by [facility] for patients greater than or equal to 50 year old. It is not intended to diagnose infection nor to guide or monitor treatment.   Culture, respiratory (NON-Expectorated)     Status: None   Collection Time: 09/24/15  9:00 AM  Result Value Ref Range Status   Specimen Description BRONCHIAL WASHINGS RIGHT  Final   Special Requests NONE  Final   Gram Stain   Final    FEW WBC PRESENT, PREDOMINANTLY PMN NO SQUAMOUS EPITHELIAL CELLS SEEN NO ORGANISMS SEEN Performed at [facility]    Culture   Final    NO GROWTH 2 DAYS Performed at [facility]    Report Status 09/26/2015 FINAL  Final  Culture, body fluid-bottle     Status: None (Preliminary result)   Collection Time: 09/24/15  9:43 AM  Result Value Ref Range Status   Specimen Description FLUID RIGHT PLEURAL  Final   Special Requests   Final    POF ZOSYN PART B BOTTLES DRAWN AEROBIC AND ANAEROBIC 10MLS   Culture NO GROWTH 4 DAYS  Final   Report Status  PENDING  Incomplete  Gram stain     Status: None   Collection Time: 09/24/15  9:43 AM  Result Value Ref Range Status   Specimen Description FLUID RIGHT PLEURAL  Final   Special Requests POF ZOSYN PART B  Final   Gram Stain   Final    RARE WBC PRESENT,BOTH PMN AND MONONUCLEAR NO ORGANISMS SEEN    Report Status 09/24/2015 FINAL  Final  Culture, body fluid-bottle     Status: None (Preliminary result)   Collection Time: 09/24/15  9:50 AM  Result Value Ref Range Status   Specimen Description FLUID RIGHT PLEURAL  Final   Special Requests POF ZOSYN PART C BOTTLES DRAWN AEROBIC ONLY 5MLS  Final   Culture NO GROWTH 4 DAYS  Final   Report Status PENDING  Incomplete  Gram stain     Status: None   Collection Time: 09/24/15  9:50 AM  Result Value Ref Range Status   Specimen Description FLUID RIGHT PLEURAL  Final   Special Requests POF ZOSYN PART C  Final   Gram Stain   Final    FEW WBC PRESENT,BOTH PMN AND MONONUCLEAR NO ORGANISMS SEEN    Report Status 09/24/2015 FINAL  Final  Tissue culture     Status: None   Collection Time: 09/24/15  9:58 AM  Result Value Ref Range Status   Specimen Description TISSUE RIGHT LUNG  Final   Special Requests POF ZOSYN  Final   Gram Stain   Final    ABUNDANT WBC PRESENT,BOTH PMN AND MONONUCLEAR NO ORGANISMS SEEN Performed at [facility]    Culture   Final    NO GROWTH 3 DAYS Performed at [facility]    Report Status 09/27/2015 FINAL  Final  Tissue culture     Status: None   Collection Time: 09/24/15 10:09 AM  Result Value Ref Range Status   Specimen Description TISSUE RIGHT LUNG  Final   Special Requests RIGHT VISCERAL PEEL POF ZOSYN  Final   Gram Stain   Final    RARE WBC PRESENT, PREDOMINANTLY PMN NO ORGANISMS SEEN Performed at [facility]    Culture   Final    NO GROWTH 3 DAYS Performed at [facility]    Report Status 09/27/2015 FINAL  Final  Tissue culture     Status: None  Collection Time: 09/24/15  10:43 AM  Result Value Ref Range Status   Specimen Description TISSUE RIGHT LUNG  Final   Special Requests RIGHT PERIATAL PLEURA POF ZOSYN  Final   Gram Stain   Final    MODERATE WBC PRESENT,BOTH PMN AND MONONUCLEAR NO ORGANISMS SEEN Performed at [facility]    Culture   Final    NO GROWTH 3 DAYS Performed at [facility]    Report Status 09/27/2015 FINAL  Final     BRIEF HOSPITAL COURSE:  Acute Hypoxic Resp Failure:secondary to PNA and large loculated pleural effusion. Underwent VATS on 1/11. Titrated off O2-now on room air  Active Problems: CAP with empyema:underwent VATS, drainage of empyema and placement of chest tube. Cultures negative so far, initially on vancomycin and Zosyn-known transitioned to Augmentin.CTVS followed closely-all chest tubes have been removed, patient is doing well and is requesting discharge. CTVS has cleared patient for discharge. She did require a Fentanyl PCA post-operatively-however pain is currently controlled with her usual narcotic regimen.  Hypothyroidism:continue Synthroid  Chronic Pain Syndrome/Fibromyalgia:continue narcotics-usual regimen  Anxiety/Depression:stable, continue Zoloft and Wellbutrin  Obesity:counseled regarding importance of weight loss.   TODAY-DAY OF DISCHARGE:  Subjective:   Ahnika Heaphy today has no headache,no chest abdominal pain,no new weakness tingling or numbness, feels much better wants to go home today.   Objective:   Blood pressure 112/78, pulse 73, temperature 97.9 F (36.6 C), temperature source Oral, resp. rate 12, height 5\' 6"  (1.676 m), weight 108.228 kg (238 lb 9.6 oz), SpO2 93 %.  Intake/Output Summary (Last 24 hours) at 09/29/15 1004 Last data filed at 09/29/15 0240  Gross per 24 hour  Intake    420 ml  Output    650 ml  Net   -230 ml   Filed Weights   09/19/15 0106 09/19/15 0638  Weight: 108.863 kg (240 lb) 108.228 kg (238 lb 9.6 oz)    Exam Awake Alert, Oriented *3, No  new F.N deficits, Normal affect [place].AT,PERRAL Supple Neck,No JVD, No cervical lymphadenopathy appriciated.  Symmetrical Chest wall movement, Good air movement bilaterally, CTAB RRR,No Gallops,Rubs or new Murmurs, No Parasternal Heave +ve B.Sounds, Abd Soft, Non tender, No organomegaly appriciated, No rebound -guarding or rigidity. No Cyanosis, Clubbing or edema, No new Rash or bruise  DISCHARGE CONDITION: Stable  DISPOSITION: Home  DISCHARGE INSTRUCTIONS:    Activity:  As tolerated with Full fall precautions use walker/cane & assistance as needed  Get Medicines reviewed and adjusted: Please take all your medications with you for your next visit with your Primary MD  Please request your Primary MD to go over all hospital tests and procedure/radiological results at the follow up, please ask your Primary MD to get all Hospital records sent to his/her office.  If you experience worsening of your admission symptoms, develop shortness of breath, life threatening emergency, suicidal or homicidal thoughts you must seek medical attention immediately by calling 911 or calling your MD immediately  if symptoms less severe.  You must read complete instructions/literature along with all the possible adverse reactions/side effects for all the Medicines you take and that have been prescribed to you. Take any new Medicines after you have completely understood and accpet all the possible adverse reactions/side effects.   Do not drive when taking Pain medications.   Do not take more than prescribed Pain, Sleep and Anxiety Medications  Special Instructions: If you have smoked or chewed Tobacco  in the last 2 yrs please stop smoking, stop any  regular Alcohol  and or any Recreational drug use.  Wear Seat belts while driving.  Please note  You were cared for by a hospitalist during your hospital stay. Once you are discharged, your primary care physician will handle any further medical issues. Please  note that NO REFILLS for any discharge medications will be authorized once you are discharged, as it is imperative that you return to your primary care physician (or establish a relationship with a primary care physician if you do not have one) for your aftercare needs so that they can reassess your need for medications and monitor your lab values.   Diet recommendation: Diabetic Diet  Discharge Instructions    Call MD for:  redness, tenderness, or signs of infection (pain, swelling, redness, odor or green/yellow discharge around incision site)    Complete by:  As directed      Call MD for:  temperature >100.4    Complete by:  As directed      Diet - low sodium heart healthy    Complete by:  As directed      Increase activity slowly    Complete by:  As directed            Follow-up Information    Follow up with Melrose Nakayama, MD In 1 week.   Specialty:  Cardiothoracic Surgery   Contact information:   [address] [place]  [address] [phone]       Follow up with FULP, CAMMIE, MD. Schedule an appointment as soon as possible for a visit in 1 week.   Specialty:  Family Medicine   Why:  Hospital follow up   Contact information:   [id number] [address] [phone]      Total Time spent on discharge equals 45 minutes.  SignedOren Binet 09/29/2015 10:04 AM

## 2015-09-29 NOTE — Progress Notes (Signed)
Occupational Therapy Treatment Patient Details Name: [name] [name] MRN: [id number] DOB: 01-Jun-1961 Today's Date: 09/29/2015    History of present illness Pt is a 55 y/o F admitted on 09/19/15 for Rt empyema and is not s/p VATS/decortication.  Pt's PMH includes COPD, Rt LE nerve damage, fibromyalgia, Bil carpal tunnel syndrome, back/neck ? surgery.   OT comments  Education completed with caregiver regarding ADL, functional mobility for ADL and activity tolerance and HEP. Pt ready for D/C home. See below.   Follow Up Recommendations  No OT follow up;Supervision/Assistance - 24 hour    Equipment Recommendations  Tub/shower bench;Other (comment)    Recommendations for Other Services      Precautions / Restrictions Precautions Precautions: Fall       Mobility Bed Mobility                  Transfers                      Balance                                   ADL                                         General ADL Comments: educated caregiver on using tub bench for tub trasnfers. Caregiver/pt verbalized understadning. educated caregiver on home safety adn reducing risk of falls. Educated on increasing activity slowly and wokring on increasing independence with ADL wtih DME and AE if needed.       Vision                     Perception     Praxis      Cognition   Behavior During Therapy: WFL for tasks assessed/performed Overall Cognitive Status: Within Functional Limits for tasks assessed                       Extremity/Trunk Assessment               Exercises Other Exercises Other Exercises: general shoulder A/AAROM Other Exercises: level 1 therband RTC strengthning -handout given Other Exercises: general UB level 1 theraband strengthening ex - written handouts given   Shoulder Instructions       General Comments      Pertinent Vitals/ Pain       Pain Assessment: 0-10 Pain Score: 2   Pain Location: back Pain Descriptors / Indicators: Aching Pain Intervention(s): Limited activity within patient's tolerance  Home Living                                          Prior Functioning/Environment              Frequency       Progress Toward Goals  OT Goals(current goals can now be found in the care plan section)  Progress towards OT goals: Goals met/education completed, patient discharged from OT  Acute Rehab OT Goals Patient Stated Goal: to go home OT Goal Formulation: With patient Time For Goal Achievement: 10/05/15 Potential to Achieve Goals: Good ADL Goals Pt Will Perform Lower Body Bathing: with caregiver independent in assisting;with min assist;with  adaptive equipment;sit to/from stand Pt Will Perform Lower Body Dressing: with min assist;with caregiver independent in assisting;with adaptive equipment;sit to/from stand Pt Will Transfer to Toilet: with supervision;ambulating Pt Will Perform Tub/Shower Transfer: Tub transfer;rolling walker;ambulating;tub bench;with caregiver independent in assisting;with supervision Pt/caregiver will Perform Home Exercise Program: Increased strength;Both right and left upper extremity;With theraband;With written HEP provided Additional ADL Goal #1: Pt will verbalize understanding of 3 energy conservation techniques for ADL  Plan Discharge plan remains appropriate    Co-evaluation                 End of Session     Activity Tolerance Patient tolerated treatment well   Patient Left in chair;with call bell/phone within reach;with family/visitor present   Nurse Communication Other (comment) (ready for D/C)        Time: 8887-5797 OT Time Calculation (min): 17 min  Charges: OT General Charges $OT Visit: 1 Procedure OT Treatments $Self Care/Home Management : 8-22 mins  Lenor Provencher,HILLARY 09/29/2015, 12:45 PM   [facility], OTR/L  [phone] 09/29/2015

## 2015-09-30 LAB — GRAM STAIN

## 2015-09-30 LAB — PH, BODY FLUID: pH, Body Fluid: 7.4

## 2015-10-01 ENCOUNTER — Ambulatory Visit (HOSPITAL_COMMUNITY): Payer: PPO | Admitting: Physical Therapy

## 2015-10-13 ENCOUNTER — Other Ambulatory Visit: Payer: Self-pay | Admitting: Thoracic Surgery (Cardiothoracic Vascular Surgery)

## 2015-10-13 DIAGNOSIS — J869 Pyothorax without fistula: Secondary | ICD-10-CM

## 2015-10-14 ENCOUNTER — Ambulatory Visit (INDEPENDENT_AMBULATORY_CARE_PROVIDER_SITE_OTHER): Payer: Self-pay | Admitting: Thoracic Surgery (Cardiothoracic Vascular Surgery)

## 2015-10-14 ENCOUNTER — Encounter: Payer: Self-pay | Admitting: Thoracic Surgery (Cardiothoracic Vascular Surgery)

## 2015-10-14 ENCOUNTER — Ambulatory Visit
Admission: RE | Admit: 2015-10-14 | Discharge: 2015-10-14 | Disposition: A | Discharge status: Home / Self Care | Payer: PPO | Source: Ambulatory Visit | Attending: Thoracic Surgery (Cardiothoracic Vascular Surgery) | Admitting: Thoracic Surgery (Cardiothoracic Vascular Surgery)

## 2015-10-14 VITALS — BP 121/78 | HR 65 | Resp 16 | Ht 66.0 in | Wt 238.0 lb

## 2015-10-14 DIAGNOSIS — J869 Pyothorax without fistula: Secondary | ICD-10-CM

## 2015-10-14 DIAGNOSIS — Z09 Encounter for follow-up examination after completed treatment for conditions other than malignant neoplasm: Secondary | ICD-10-CM

## 2015-10-14 DIAGNOSIS — J90 Pleural effusion, not elsewhere classified: Secondary | ICD-10-CM | POA: Diagnosis not present

## 2015-10-14 NOTE — Progress Notes (Signed)
[address]       [place] [address]             [phone]       HPI: [name] [name] turns for a scheduled postoperative follow-up visit.  [name] [name] is a 55 year old woman who was [facility] management of a right empyema. I did bronchoscopy, right VATS, drainage of an empyema and decortication on 09/24/2015. [name] [name] did well postoperatively and went home on 09/29/2015.  Her intraoperative cultures were negative. [name] [name] was treated with Zosyn initially and discharged home on Augmentin. [name] [name] has completed her course of antibiotics.  [name] [name] has not had any fevers, but has had some night sweats. [name] [name] has not had any significant cough, but really didn't have much cough initially. [name] [name] does have some incisional pain, but is only taking her normal MS Contin doses. Her energy level is low but starting to improve. [name] [name] has stopped smoking including her electronic cigarette.  Patient Active Problem List   Diagnosis Date Noted  . Parapneumonic effusion 09/22/2015  . Empyema, right ([facility]) 09/22/2015  . Acute respiratory failure with hypoxia ([facility]) 09/20/2015  . Depression with anxiety 09/20/2015  . Chest pain on respiration 09/19/2015  . CAP (community acquired pneumonia) 09/19/2015  . Chronic pain syndrome 09/19/2015  . Hypothyroidism, adult 09/19/2015  . Carpal tunnel syndrome 01/13/2015  . FEVER BLISTER 05/22/2009  . VAGINITIS 05/22/2009  . ASTHMA, WITH ACUTE EXACERBATION 03/26/2009  . GASTRITIS 10/25/2008  . MALAISE AND FATIGUE 10/25/2008  . MENORRHAGIA 05/09/2008  . ASTHMA, MILD 08/24/2007  . Hypothyroidism 07/20/2007  . TOBACCO ABUSE 01/26/2007  . OBESITY NOS 11/01/2006  . FIBROIDS, UTERUS 10/17/2006  . HYPERLIPIDEMIA 10/17/2006  . ANXIETY 10/17/2006  . DEPRESSION 10/17/2006  . CONSTIPATION NOS 10/17/2006  . LOW BACK PAIN 10/17/2006  . FIBROMYALGIA 10/17/2006      Current Outpatient Prescriptions  Medication Sig Dispense Refill  . buPROPion  (WELLBUTRIN SR) 200 MG 12 hr tablet Take 200 mg by mouth 2 (two) times daily.    [name] gabapentin (NEURONTIN) 600 MG tablet Take 600 mg by mouth 3 (three) times daily.    [name] levothyroxine (SYNTHROID, LEVOTHROID) 112 MCG tablet Take 112 mcg by mouth daily before breakfast.    . morphine (MS CONTIN) 60 MG 12 hr tablet Take 60 mg by mouth 3 (three) times daily.    [name] morphine (MSIR) 15 MG tablet Take 15 mg by mouth every 4 (four) hours as needed for severe pain.    [name] sertraline (ZOLOFT) 100 MG tablet Take 100 mg by mouth daily.    . tizanidine (ZANAFLEX) 2 MG capsule Take 2 mg by mouth 3 (three) times daily.    . traZODone (DESYREL) 100 MG tablet Take 1 tablet (100 mg total) by mouth at bedtime as needed for sleep.     No current facility-administered medications for this visit.    Physical Exam BP 121/78 mmHg  Pulse 65  Resp 16  Ht 5\' 6"  (1.676 m)  Wt 238 lb (107.956 kg)  BMI 38.43 kg/m2  SpO2 67% Obese 55 year old woman in no acute distress Alert and oriented 3 with no focal deficits Lungs with diminished breath sounds right base, otherwise clear Incisions healing well  Diagnostic Tests: I personally reviewed her chest x-ray. It shows some residual pleural thickening +/- effusion at the right base. Overall it is dramatically improved from her preoperative chest x-ray  Impression: 55 year old woman who is now about 2 weeks out from  a right VATS and decortication for an early organizing empyema. All things considered, I think [name] [name] is doing very well at this time.  [name] [name] does complain of lack of energy and stamina. I reassured her that this is normal during the early recovery phase and [name] [name] should start to see improvement over the next 4-6 weeks.  [name] [name] may begin driving in a week. Appropriate precautions were discussed. [name] [name] is disabled so return to work is not an issue.  There are no other restrictions on her activities but [name] [name] was cautioned to build into new activities gradually.  Plan: I  will be happy to see her back any time if I can be of any further assistance with her care.  Melrose Nakayama, MD [facility] [phone]

## 2015-10-16 DIAGNOSIS — G894 Chronic pain syndrome: Secondary | ICD-10-CM | POA: Diagnosis not present

## 2015-10-16 DIAGNOSIS — M961 Postlaminectomy syndrome, not elsewhere classified: Secondary | ICD-10-CM | POA: Diagnosis not present

## 2015-10-17 DIAGNOSIS — J869 Pyothorax without fistula: Secondary | ICD-10-CM | POA: Diagnosis not present

## 2015-10-17 DIAGNOSIS — J4520 Mild intermittent asthma, uncomplicated: Secondary | ICD-10-CM | POA: Diagnosis not present

## 2015-10-17 DIAGNOSIS — R0789 Other chest pain: Secondary | ICD-10-CM | POA: Diagnosis not present

## 2015-10-17 DIAGNOSIS — Z23 Encounter for immunization: Secondary | ICD-10-CM | POA: Diagnosis not present

## 2015-10-30 ENCOUNTER — Ambulatory Visit (HOSPITAL_COMMUNITY)
Admission: RE | Admit: 2015-10-30 | Discharge: 2015-10-30 | Disposition: A | Discharge status: Home / Self Care | Payer: PPO | Source: Ambulatory Visit | Attending: Family Medicine | Admitting: Family Medicine

## 2015-10-30 ENCOUNTER — Other Ambulatory Visit (HOSPITAL_COMMUNITY): Payer: Self-pay | Admitting: Family Medicine

## 2015-10-30 DIAGNOSIS — R05 Cough: Secondary | ICD-10-CM | POA: Diagnosis not present

## 2015-10-30 DIAGNOSIS — J869 Pyothorax without fistula: Secondary | ICD-10-CM

## 2015-11-10 ENCOUNTER — Institutional Professional Consult (permissible substitution): Payer: PPO | Admitting: Pulmonary Disease

## 2015-12-02 DIAGNOSIS — G5601 Carpal tunnel syndrome, right upper limb: Secondary | ICD-10-CM | POA: Diagnosis not present

## 2015-12-02 DIAGNOSIS — M25512 Pain in left shoulder: Secondary | ICD-10-CM | POA: Diagnosis not present

## 2015-12-11 ENCOUNTER — Ambulatory Visit (INDEPENDENT_AMBULATORY_CARE_PROVIDER_SITE_OTHER): Payer: PPO | Admitting: Pulmonary Disease

## 2015-12-11 ENCOUNTER — Encounter: Payer: Self-pay | Admitting: Pulmonary Disease

## 2015-12-11 ENCOUNTER — Telehealth: Payer: Self-pay | Admitting: Pulmonary Disease

## 2015-12-11 VITALS — BP 122/72 | HR 71 | Ht 66.0 in | Wt 248.8 lb

## 2015-12-11 DIAGNOSIS — J45909 Unspecified asthma, uncomplicated: Secondary | ICD-10-CM | POA: Diagnosis not present

## 2015-12-11 DIAGNOSIS — J869 Pyothorax without fistula: Secondary | ICD-10-CM

## 2015-12-11 NOTE — Telephone Encounter (Signed)
PFT 07/04/13: FVC 4.53 L (125%) FEV1 3.43 L (120%) FEV1/FVC 0.76 FEF 25-75 2.64 L (97%) no bronchodilator response TLC 5.32 L (102%) RV 100% ERV 83% DLCO uncorrected 87%

## 2015-12-11 NOTE — Patient Instructions (Signed)
   Continue using your albuterol inhaler as prescribed  We will do a breathing and walking test at your next appointment  Please call my office if you have any new breathing problems or questions before your next appointment  TESTS ORDERED: 1. Full pulmonary function testing at next appointment 2. 6 minute walk test on room air at next appointment

## 2015-12-11 NOTE — Progress Notes (Signed)
Subjective:    Patient ID: [name] [name], female    DOB: 12/03/1960, 55 y.o.   MRN: [id number]  HPI On reviewing the patient's electronic medical record she was admitted to outside hospital and subsequently transferred to [facility] with report of a right-sided empyema entrapped lung. Patient underwent VATS with decortication on 09/24/15 by Dr. Roxan Hockey. Patient previously diagnosed with asthma. She reports that she has progressively improved with regards to her dyspnea. She feels she hasn't returned to her baseline. She is only able to walk 200-250 feet down and up her driveway due to dyspnea. She continues to have significant fatigue. Denies any significant cough. She does wheeze at times. No fever or chills. She does have sweats that she has been attributing to being post-menopausal. She denies any chest pain or pressure. No abdominal pain, nausea, or emesis. She reports she was diagnosed with breathing problems after smoking. No h/o asthma or breathing problems as a child. As a young adult she had no bronchitis but as she got older she did get frequent bronchitis. She previously was on Advair but it didn't seem to help her breathing. She uses her Albuterol inhaler 1-2 times daily. She does feel like it helps significantly. She has problems breathing in the Spring & Fall as well as with exposure to extremes in temperature & humidity. No breathing problems with exposure to noxious stimuli.   Review of Systems No rashes or abnormal bruising. She reports diffuse joint pain as well as chronic pain. Her left index finger has some deformity but not other swelling or erythema. Has chronic pain in her lower back that radiates down her right leg. A pertinent 14 point review of systems is negative except as per the history of presenting illness.  Allergies  Allergen Reactions  . Celebrex [Celecoxib] Other (See Comments)    Severe epigastric pain  . Nsaids Other (See Comments)    SEVERE EPIGASTRIC PAIN    . Percocet [Oxycodone-Acetaminophen] Itching    Current Outpatient Prescriptions on File Prior to Visit  Medication Sig Dispense Refill  . buPROPion (WELLBUTRIN SR) 200 MG 12 hr tablet Take 200 mg by mouth 2 (two) times daily.    [name] gabapentin (NEURONTIN) 600 MG tablet Take 600 mg by mouth 3 (three) times daily.    [name] morphine (MS CONTIN) 60 MG 12 hr tablet Take 60 mg by mouth 3 (three) times daily.    [name] morphine (MSIR) 15 MG tablet Take 15 mg by mouth every 4 (four) hours as needed for severe pain.    [name] sertraline (ZOLOFT) 100 MG tablet Take 100 mg by mouth daily.    . tizanidine (ZANAFLEX) 2 MG capsule Take 2 mg by mouth daily. Reported on 12/11/2015     No current facility-administered medications on file prior to visit.    Past Medical History  Diagnosis Date  . Asthma   . COPD (chronic obstructive pulmonary disease) ([facility])   . Emphysema of lung ([facility])   . GERD (gastroesophageal reflux disease)   . Thyroid disease     hypo thyroid  . Nerve damage     right leg  . Neuromuscular disorder ([facility])     fibromyalgia  . Anxiety   . Carpal tunnel syndrome 01/13/2015    Bilateral    Past Surgical History  Procedure Laterality Date  . Anterior cervical decomp/discectomy fusion      L4, L5   . Tonsillectomy    . Video bronchoscopy N/A 09/24/2015  Procedure: VIDEO BRONCHOSCOPY;  Surgeon: Melrose Nakayama, MD;  Location: [facility] OR;  Service: Thoracic;  Laterality: N/A;  . Video assisted thoracoscopy (vats)/decortication Right 09/24/2015    Procedure: VIDEO ASSISTED THORACOSCOPY (VATS)/DECORTICATION;  Surgeon: Melrose Nakayama, MD;  Location: [facility];  Service: Thoracic;  Laterality: Right;    Family History  Problem Relation Age of Onset  . Colon cancer Neg Hx   . Esophageal cancer Neg Hx   . Stomach cancer Neg Hx   . Rectal cancer Neg Hx   . Bone cancer Father   . Asthma Father   . Diabetes Father   . Pneumonia Mother     biological mother deceased at age 30    Social  History   Social History  . Marital Status: Single    Spouse Name: N/A  . Number of Children: N/A  . Years of Education: N/A   Occupational History  . Disabled     Electrical engineer   Social History Main Topics  . Smoking status: Former Smoker -- 1.25 packs/day    Types: E-cigarettes, Cigarettes    Start date: 04/16/1971    Quit date: 09/13/2005  . Smokeless tobacco: Former Systems developer    Types: Snuff, Chew    Quit date: 09/14/2007  . Alcohol Use: No     Comment: Quit 11/11/1989  . Drug Use: No     Comment: Quit in 1991  . Sexual Activity: Not Asked   Other Topics Concern  . None   Social History Narrative   Originally from [place], [place]. Previously has lived in [place], [place], [place], [place], [place], [place], [place]. She moved to [place] in 1995. 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 Currently has dogs and a cat. Remote conure bird exposure for 1 year. She has an outdoor hot tub that they use regularly. Previously had mold & mildew in their home for approximately 1 year.       Objective:   Physical Exam BP 122/72 mmHg  Pulse 71  Ht 5\' 6"  (1.676 m)  Wt 248 lb 12.8 oz (112.855 kg)  BMI 40.18 kg/m2  SpO2 94%  LMP 10/24/2012 General:  Awake. Alert. No acute distress. Moderate central obesity.  Integument:  Warm & dry. No rash on exposed skin. No bruising. Lymphatics:  No appreciated cervical or supraclavicular lymphadenoapthy. HEENT:  Moist mucus membranes. No oral ulcers. Moderate bilateral nasal turbinate swelling. Cardiovascular:  Regular rate. No edema. No appreciable JVD.  Pulmonary:  Good aeration & clear to auscultation bilaterally but slightly decreased in the right base. Symmetric chest wall expansion. No accessory muscle use. Abdomen: Soft. Normal bowel sounds. Protuberant. Grossly nontender. Musculoskeletal:  Normal bulk and tone. Hand grip strength 5/5 bilaterally. No joint deformity or effusion  appreciated. Neurological:  CN 2-12 grossly in tact. No meningismus. Moving all 4 extremities equally. Symmetric brachioradialis deep tendon reflexes. Psychiatric:  Mood and affect congruent. Speech normal rhythm, rate & tone.   IMAGING CXR PA/LAT 10/30/15 (personally reviewed by me): Pleural thickening on the right consistent with prior VATS and decortication on the right. No appreciated pleural fluid. No parenchymal nodule or mass appreciated. Heart normal in size. Mediastinum normal in contour.  PATHOLOGY Right Pleural Fluid (09/24/15):  No malignant cells. Right Pleural Fluid (09/22/15):  No malignant cells. Right Pleural Peel (09/22/15):  No atypia or malignancy identified. Benign organizing fibrin inflammatory debris.  MICROBIOLOGY Right Pleura Visceral & Parietal (  09/24/15): Negative Right Pleural Fluid (09/24/15):  Negative  Right Pleural Fluid (09/24/15):  Negative  Right Bronchial Wash (09/24/15):  Negative  Right Pleural Fluid (09/22/15):  Negative   LABS PFA (09/22/15) Glucose:  <20 LDH:  3339 Total protein: 5.2 (serum 6.7) PH: 7.4 WBC:  7830 (neutro 36%, monocytes 53%, lymphs 11%)    Assessment & Plan:  55 year old female status post VATS with decortication for right-sided empyema in January 2017. Previously diagnosed with asthma/COPD but it's unclear whether or not the patient has had full pulmonary function testing. Certainly her symptoms could be due to her chronic tobacco and inhaled drug use versus simple recovery from her severe illness in January. I reviewed her chest x-ray which shows continued pleural scarring on the right. I instructed the patient to contact my office if she had any new breathing problems before her next appointment and review the risk factors of continued use of her hot tub.  1. Asthma: Checking full pulmonary function testing at next appointment. Continuing to use albuterol inhaler as needed. 2. Empyema: Status post VATS with decortication. Checking 6 minute  walk test and full pulmonary function testing at next appointment. 3. Health maintenance: Patient received Prevnar vaccine March 2017, Pneumovax November 2008, & Tdap July 2016. 4. Follow-up: Patient to return to clinic in 1-2 months or sooner if needed.  Sonia Baller Ashok Cordia, M.D. [facility] Pager:  [phone] After 3pm or if no response, call [phone] 3:25 PM 12/11/2015

## 2015-12-15 DIAGNOSIS — M7582 Other shoulder lesions, left shoulder: Secondary | ICD-10-CM | POA: Diagnosis not present

## 2015-12-15 DIAGNOSIS — G894 Chronic pain syndrome: Secondary | ICD-10-CM | POA: Diagnosis not present

## 2015-12-18 DIAGNOSIS — G5601 Carpal tunnel syndrome, right upper limb: Secondary | ICD-10-CM | POA: Diagnosis not present

## 2015-12-31 DIAGNOSIS — M25512 Pain in left shoulder: Secondary | ICD-10-CM | POA: Diagnosis not present

## 2016-01-07 ENCOUNTER — Other Ambulatory Visit: Payer: Self-pay | Admitting: Family Medicine

## 2016-01-07 ENCOUNTER — Other Ambulatory Visit (HOSPITAL_COMMUNITY)
Admission: RE | Admit: 2016-01-07 | Discharge: 2016-01-07 | Disposition: A | Discharge status: Home / Self Care | Payer: PPO | Source: Ambulatory Visit | Attending: Family Medicine | Admitting: Family Medicine

## 2016-01-07 DIAGNOSIS — Z01419 Encounter for gynecological examination (general) (routine) without abnormal findings: Secondary | ICD-10-CM | POA: Diagnosis not present

## 2016-01-07 DIAGNOSIS — Z124 Encounter for screening for malignant neoplasm of cervix: Secondary | ICD-10-CM | POA: Diagnosis not present

## 2016-01-07 DIAGNOSIS — Z6841 Body Mass Index (BMI) 40.0 and over, adult: Secondary | ICD-10-CM | POA: Diagnosis not present

## 2016-01-07 DIAGNOSIS — Z1159 Encounter for screening for other viral diseases: Secondary | ICD-10-CM | POA: Diagnosis not present

## 2016-01-07 DIAGNOSIS — Z1239 Encounter for other screening for malignant neoplasm of breast: Secondary | ICD-10-CM | POA: Diagnosis not present

## 2016-01-07 DIAGNOSIS — Z0000 Encounter for general adult medical examination without abnormal findings: Secondary | ICD-10-CM | POA: Diagnosis not present

## 2016-01-08 ENCOUNTER — Other Ambulatory Visit: Payer: Self-pay | Admitting: Orthopaedic Surgery

## 2016-01-08 DIAGNOSIS — M25512 Pain in left shoulder: Secondary | ICD-10-CM

## 2016-01-09 LAB — CYTOLOGY - PAP

## 2016-01-19 ENCOUNTER — Other Ambulatory Visit: Payer: PPO

## 2016-01-30 ENCOUNTER — Ambulatory Visit: Payer: PPO | Admitting: Nutrition

## 2016-02-12 DIAGNOSIS — M961 Postlaminectomy syndrome, not elsewhere classified: Secondary | ICD-10-CM | POA: Diagnosis not present

## 2016-02-12 DIAGNOSIS — G894 Chronic pain syndrome: Secondary | ICD-10-CM | POA: Diagnosis not present

## 2016-02-26 ENCOUNTER — Ambulatory Visit: Payer: PPO | Admitting: Nutrition

## 2016-03-03 ENCOUNTER — Ambulatory Visit: Payer: PPO

## 2016-03-03 ENCOUNTER — Ambulatory Visit: Payer: PPO | Admitting: Pulmonary Disease

## 2016-03-05 ENCOUNTER — Telehealth: Payer: Self-pay | Admitting: Nutrition

## 2016-03-05 NOTE — Telephone Encounter (Signed)
Vm to return call to reschedule missed appt.

## 2016-04-14 DIAGNOSIS — G894 Chronic pain syndrome: Secondary | ICD-10-CM | POA: Diagnosis not present

## 2016-04-14 DIAGNOSIS — M961 Postlaminectomy syndrome, not elsewhere classified: Secondary | ICD-10-CM | POA: Diagnosis not present

## 2016-04-16 DIAGNOSIS — M25512 Pain in left shoulder: Secondary | ICD-10-CM | POA: Diagnosis not present

## 2016-04-16 DIAGNOSIS — S46012A Strain of muscle(s) and tendon(s) of the rotator cuff of left shoulder, initial encounter: Secondary | ICD-10-CM | POA: Diagnosis not present

## 2016-04-28 ENCOUNTER — Ambulatory Visit (INDEPENDENT_AMBULATORY_CARE_PROVIDER_SITE_OTHER): Payer: PPO | Admitting: Pulmonary Disease

## 2016-04-28 ENCOUNTER — Encounter: Payer: Self-pay | Admitting: Pulmonary Disease

## 2016-04-28 ENCOUNTER — Encounter (INDEPENDENT_AMBULATORY_CARE_PROVIDER_SITE_OTHER): Payer: PPO | Admitting: Pulmonary Disease

## 2016-04-28 ENCOUNTER — Telehealth: Payer: Self-pay | Admitting: *Deleted

## 2016-04-28 VITALS — BP 146/84 | HR 76 | Ht 66.0 in | Wt 260.0 lb

## 2016-04-28 DIAGNOSIS — J45909 Unspecified asthma, uncomplicated: Secondary | ICD-10-CM | POA: Diagnosis not present

## 2016-04-28 DIAGNOSIS — Z0000 Encounter for general adult medical examination without abnormal findings: Secondary | ICD-10-CM | POA: Diagnosis not present

## 2016-04-28 DIAGNOSIS — J4520 Mild intermittent asthma, uncomplicated: Secondary | ICD-10-CM

## 2016-04-28 DIAGNOSIS — R5383 Other fatigue: Secondary | ICD-10-CM | POA: Diagnosis not present

## 2016-04-28 DIAGNOSIS — Z1159 Encounter for screening for other viral diseases: Secondary | ICD-10-CM | POA: Diagnosis not present

## 2016-04-28 DIAGNOSIS — R946 Abnormal results of thyroid function studies: Secondary | ICD-10-CM | POA: Diagnosis not present

## 2016-04-28 DIAGNOSIS — Z6841 Body Mass Index (BMI) 40.0 and over, adult: Secondary | ICD-10-CM | POA: Diagnosis not present

## 2016-04-28 DIAGNOSIS — J869 Pyothorax without fistula: Secondary | ICD-10-CM

## 2016-04-28 DIAGNOSIS — Z136 Encounter for screening for cardiovascular disorders: Secondary | ICD-10-CM | POA: Diagnosis not present

## 2016-04-28 DIAGNOSIS — R7301 Impaired fasting glucose: Secondary | ICD-10-CM | POA: Diagnosis not present

## 2016-04-28 LAB — PULMONARY FUNCTION TEST
DL/VA % pred: 112 %
DL/VA: 5.69 ml/min/mmHg/L
DLCO cor % pred: 97 %
DLCO cor: 26.36 ml/min/mmHg
DLCO unc % pred: 104 %
DLCO unc: 28.05 ml/min/mmHg
FEF 25-75 Post: 2.12 L/sec
FEF 25-75 Pre: 1.42 L/sec
FEF2575-%Change-Post: 49 %
FEF2575-%Pred-Post: 79 %
FEF2575-%Pred-Pre: 53 %
FEV1-%Change-Post: 10 %
FEV1-%Pred-Post: 76 %
FEV1-%Pred-Pre: 69 %
FEV1-Post: 2.22 L
FEV1-Pre: 2 L
FEV1FVC-%Change-Post: 1 %
FEV1FVC-%Pred-Pre: 93 %
FEV6-%Change-Post: 9 %
FEV6-%Pred-Post: 82 %
FEV6-%Pred-Pre: 75 %
FEV6-Post: 2.96 L
FEV6-Pre: 2.7 L
FEV6FVC-%Change-Post: 0 %
FEV6FVC-%Pred-Post: 103 %
FEV6FVC-%Pred-Pre: 102 %
FVC-%Change-Post: 9 %
FVC-%Pred-Post: 80 %
FVC-%Pred-Pre: 73 %
FVC-Post: 2.96 L
FVC-Pre: 2.7 L
Post FEV1/FVC ratio: 75 %
Post FEV6/FVC ratio: 100 %
Pre FEV1/FVC ratio: 74 %
Pre FEV6/FVC Ratio: 100 %
RV % pred: 113 %
RV: 2.25 L
TLC % pred: 95 %
TLC: 5.11 L

## 2016-04-28 NOTE — Progress Notes (Addendum)
Subjective:    Patient ID: [name] [name], female    DOB: October 19, 1960, 55 y.o.   MRN: [id number]  C.C.:  Follow-up for Mild, Intermittent Asthma.  HPI Mild, Intermittent Asthma:  She reports intermittent dyspnea with exposure to the increased heat & humidity. She denies any nocturnal awakenings with breathing problems, coughing, & wheezing. She reports she uses her rescue inhaler a couple of times a week but admits she hasn't been using her rescue inhaler when she feels she needs it. No exacerbations since last appointment.   Review of Systems She reports some mild right pleuritic chest pain with deep breathing & sighing. She reports chest tightness with her episodes of dyspnea. No fever, chills, or sweats. No nausea, emesis, or abdominal pain. No reflux or dyspepsia.   Allergies  Allergen Reactions  . Celebrex [Celecoxib] Other (See Comments)    Severe epigastric pain  . Nsaids Other (See Comments)    SEVERE EPIGASTRIC PAIN  . Percocet [Oxycodone-Acetaminophen] Itching    Current Outpatient Prescriptions on File Prior to Visit  Medication Sig Dispense Refill  . albuterol (PROVENTIL) (2.5 MG/3ML) 0.083% nebulizer solution as needed.    [name] buPROPion (WELLBUTRIN SR) 200 MG 12 hr tablet Take 200 mg by mouth 2 (two) times daily.    [name] gabapentin (NEURONTIN) 600 MG tablet Take 600 mg by mouth 3 (three) times daily.    [name] levothyroxine (SYNTHROID, LEVOTHROID) 137 MCG tablet Take 1 tablet by mouth daily.    [name] morphine (MS CONTIN) 60 MG 12 hr tablet Take 60 mg by mouth every 12 (twelve) hours.     [name] morphine (MSIR) 15 MG tablet Take 15 mg by mouth every 4 (four) hours as needed for severe pain.    . Multiple Vitamin (MULTIVITAMIN) capsule Take 1 capsule by mouth daily.    . sertraline (ZOLOFT) 100 MG tablet Take 100 mg by mouth daily.    . tizanidine (ZANAFLEX) 2 MG capsule Take 2 mg by mouth 3 times/day as needed-between meals & bedtime. Reported on 12/11/2015     No current  facility-administered medications on file prior to visit.     Past Medical History:  Diagnosis Date  . Anxiety   . Asthma   . Carpal tunnel syndrome 01/13/2015   Bilateral  . COPD (chronic obstructive pulmonary disease) ([place])   . Emphysema of lung ([place])   . GERD (gastroesophageal reflux disease)   . Nerve damage    right leg  . Neuromuscular disorder ([facility])    fibromyalgia  . Thyroid disease    hypo thyroid    Past Surgical History:  Procedure Laterality Date  . ANTERIOR CERVICAL DECOMP/DISCECTOMY FUSION     L4, L5   . TONSILLECTOMY    . VIDEO ASSISTED THORACOSCOPY (VATS)/DECORTICATION Right 09/24/2015   Procedure: VIDEO ASSISTED THORACOSCOPY (VATS)/DECORTICATION;  Surgeon: Melrose Nakayama, MD;  Location: [facility];  Service: Thoracic;  Laterality: Right;  [name] VIDEO BRONCHOSCOPY N/A 09/24/2015   Procedure: VIDEO BRONCHOSCOPY;  Surgeon: Melrose Nakayama, MD;  Location: [facility];  Service: Thoracic;  Laterality: N/A;    Family History  Problem Relation Age of Onset  . Colon cancer Neg Hx   . Esophageal cancer Neg Hx   . Stomach cancer Neg Hx   . Rectal cancer Neg Hx   . Bone cancer Father   . Asthma Father   . Diabetes Father   . Pneumonia Mother     biological mother deceased at age 56    Social  History   Social History  . Marital status: Single    Spouse name: N/A  . Number of children: N/A  . Years of education: N/A   Occupational History  . Disabled     Electrical engineer   Social History Main Topics  . Smoking status: Former Smoker    Packs/day: 1.25    Types: E-cigarettes, Cigarettes    Start date: 04/16/1971    Quit date: 09/13/2005  . Smokeless tobacco: Former Systems developer    Types: Snuff, Chew    Quit date: 09/14/2007  . Alcohol use No     Comment: Quit 11/11/1989  . Drug use: No     Comment: Quit in 1991  . Sexual activity: Not Asked   Other Topics Concern  . None   Social History Narrative   Originally from [place], [place]. Previously has lived in  [place], [place], [place], [place], [place], [place], [place] She moved to [place] in 1995. 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 Currently has dogs and a cat. Remote conure bird exposure for 1 year. She has an outdoor hot tub that they use regularly. Previously had mold & mildew in their home for approximately 1 year.       Objective:   Physical Exam BP (!) 146/84 (BP Location: Left Arm, Cuff Size: Large)   Pulse 76   Ht 5\' 6"  (1.676 m)   Wt 260 lb (117.9 kg)   LMP 10/24/2012   SpO2 92%   BMI 41.97 kg/m  General:  Awake. Alert. Moderate central obesity. Comfortable. Integument:  Warm & dry. No rash on exposed skin.  Lymphatics:  No appreciated cervical or supraclavicular lymphadenoapthy. HEENT:  Moist mucus membranes. No oral ulcers. Persistent nasal turbinate swelling with erythematous mucosa.  Cardiovascular:  Regular rate. No edema. Normal S1 & S2  Pulmonary:  Clear bilaterally to auscultation. Normal work of breathing on room air without accessory muscle use. Speaking in complete sentences.  Abdomen: Soft. Normal bowel sounds. Protuberant.   PFT 04/28/16: FVC 2.70 L (73%) FEV1 2.00 L (69%) FEV1/FVC 0.74 FEF 25-75 1.14 L (53%) negative bronchodilator response TLC 5.11 L (95%) RV 113% ERV 7% DLCO corrected 97% (hgb 15.7)  6MWT 04/28/16:  Walked 452 meters / Baseline Sat 100% on RA / Nadir Sat 94% on RA  IMAGING CXR PA/LAT 10/30/15 (previously reviewed by me): Pleural thickening on the right consistent with prior VATS and decortication on the right. No appreciated pleural fluid. No parenchymal nodule or mass appreciated. Heart normal in size. Mediastinum normal in contour.  PATHOLOGY Right Pleural Fluid (09/24/15):  No malignant cells. Right Pleural Fluid (09/22/15):  No malignant cells. Right Pleural Peel (09/22/15):  No atypia or malignancy identified. Benign organizing fibrin inflammatory debris.  MICROBIOLOGY Right  Pleura Visceral & Parietal (09/24/15): Negative Right Pleural Fluid (09/24/15):  Negative  Right Pleural Fluid (09/24/15):  Negative  Right Bronchial Wash (09/24/15):  Negative  Right Pleural Fluid (09/22/15):  Negative   LABS PFA (09/22/15) Glucose:  <20 LDH:  3339 Total protein: 5.2 (serum 6.7) PH: 7.4 WBC:  7830 (neutro 36%, monocytes 53%, lymphs 11%)    Assessment & Plan:  55 y.o. female status post VATS with decortication for right-sided empyema in January 2017. Previously diagnosed with asthma without airway obstruction on spirometry today. I suspect that she may have some fixed airway obstruction not appreciated due to the reduction in her FVC due to her  central obesity. I do believe her asthma is reasonably well controlled but without any bronchodilator response or frequent use of her rescue medication I do not feel additional inhaler medications are warranted at this time. I did educate the patient on the potential for worsening asthma with uncontrolled reflux as well as symptom monitoring with peak flows as well as frequency rescue inhaler use. She'll contact my office for any new breathing problems before her next appointment if she needs to be seen sooner.   1. Mild, Intermittent Asthma: Patient continuing to use albuterol inhaler as needed. She will monitor her peak flows and notify my office for any frequent rescue inhaler use or reduction in peak flows by more than 10%. Checking serum CBC with differential & RAST Panel. 2. Health Maintenance: Prevnar vaccine March 2017, Pneumovax November 2008, & Tdap July 2016. recommended influenza vaccine in September. Plan for Pneumovax in March 2018.  3. Follow-up: Patient to return to clinic in  one year or sooner if needed.  Sonia Baller Ashok Cordia, M.D. [facility] Pager:  [phone] After 3pm or if no response, call [phone] 1:48 PM 04/28/16

## 2016-04-28 NOTE — Telephone Encounter (Signed)
LMTCB x1 for pt Apologize to patient as Dr. Ashok Cordia forgot to order labs on patient and is wanting her to have these done. Pt needs CBC w/ DIFF and the resp allergy panel.  Pt can have this done at [facility] if she is wanting too.

## 2016-04-28 NOTE — Telephone Encounter (Signed)
Patient returned call, CB is [phone]

## 2016-04-28 NOTE — Addendum Note (Signed)
Addended by: Inge Rise on: 04/28/2016 02:11 PM   Modules accepted: Orders

## 2016-04-28 NOTE — Patient Instructions (Addendum)
   Continue using your albuterol inhaler as needed.  Call me if you find you're needing to use your rescue inhaler on a daily basis.  Remember to check your peak flows twice daily for a week & record the values. Then check your peak flow a couple of times a week & notify us if your level drops by more than 10% as this could be a sign of impending breathing problems.  Remember to get the Flu Shot in September.   I will see you back in 1 year or sooner if needed.   TESTS ORDERED: 1. Full PFTs at follow-up appointment 2. Serum RAST Panel & CBC w/ Differential

## 2016-04-29 NOTE — Progress Notes (Signed)
Test reviewed.  

## 2016-04-29 NOTE — Telephone Encounter (Signed)
LMTCB 2 for pt

## 2016-04-29 NOTE — Telephone Encounter (Signed)
Pt called back. Lab orders placed. She will have these done at [facility]. Nothing further needed

## 2016-06-03 DIAGNOSIS — M25512 Pain in left shoulder: Secondary | ICD-10-CM | POA: Diagnosis not present

## 2016-06-18 DIAGNOSIS — G894 Chronic pain syndrome: Secondary | ICD-10-CM | POA: Diagnosis not present

## 2016-06-18 DIAGNOSIS — M961 Postlaminectomy syndrome, not elsewhere classified: Secondary | ICD-10-CM | POA: Diagnosis not present

## 2016-07-05 ENCOUNTER — Telehealth (INDEPENDENT_AMBULATORY_CARE_PROVIDER_SITE_OTHER): Payer: Self-pay | Admitting: *Deleted

## 2016-07-05 NOTE — Telephone Encounter (Signed)
Pt called wanted to let Dr. Ninfa Linden know that the shot that was injected 2-3 weeks ago has helped her Tremendously and wants to hold off on surgery and will call back if needed.

## 2016-07-06 NOTE — Telephone Encounter (Signed)
Please advise 

## 2016-08-11 ENCOUNTER — Ambulatory Visit (INDEPENDENT_AMBULATORY_CARE_PROVIDER_SITE_OTHER): Payer: PPO | Admitting: Acute Care

## 2016-08-11 ENCOUNTER — Encounter: Payer: Self-pay | Admitting: Acute Care

## 2016-08-11 ENCOUNTER — Ambulatory Visit (INDEPENDENT_AMBULATORY_CARE_PROVIDER_SITE_OTHER)
Admission: RE | Admit: 2016-08-11 | Discharge: 2016-08-11 | Disposition: A | Discharge status: Home / Self Care | Payer: PPO | Source: Ambulatory Visit | Attending: Acute Care | Admitting: Acute Care

## 2016-08-11 VITALS — BP 128/78 | HR 88 | Ht 66.0 in | Wt 276.0 lb

## 2016-08-11 DIAGNOSIS — R062 Wheezing: Secondary | ICD-10-CM

## 2016-08-11 DIAGNOSIS — R05 Cough: Secondary | ICD-10-CM | POA: Diagnosis not present

## 2016-08-11 MED ORDER — PREDNISONE 10 MG PO TABS: ORAL_TABLET | ORAL | 0 refills | Status: DC

## 2016-08-11 MED ORDER — FLUTICASONE FUROATE-VILANTEROL 100-25 MCG/INH IN AEPB: 1.0000 | INHALATION_SPRAY | Freq: Every day | RESPIRATORY_TRACT | 0 refills | Status: AC

## 2016-08-11 MED ORDER — ALBUTEROL SULFATE HFA 108 (90 BASE) MCG/ACT IN AERS: 2.0000 | INHALATION_SPRAY | Freq: Four times a day (QID) | RESPIRATORY_TRACT | 6 refills | Status: DC | PRN

## 2016-08-11 MED ORDER — ALBUTEROL SULFATE HFA 108 (90 BASE) MCG/ACT IN AERS: 2.0000 | INHALATION_SPRAY | Freq: Four times a day (QID) | RESPIRATORY_TRACT | 0 refills | Status: DC | PRN

## 2016-08-11 NOTE — Patient Instructions (Addendum)
It is nice to meet you today. We will prescribe your ventolin today. Use this up to every 6 hours as needed for break through shortness of breath or wheezing. We will give you a sample of Breo 100  today. This is maintenance medication for your asthma. Take one puff daily. Rinse mouth after use Ty this for 4 weeks and let us know if it helps Prednisone taper; 10 mg tablets: 4 tabs x 2 days, 3 tabs x 2 days, 2 tabs x 2 days 1 tab x 2 days then stop. CXR today We will Call with results Follow up with NP or Dr. Ashok Cordia in 4 weeks Please contact office for sooner follow up if symptoms do not improve or worsen or seek emergency care

## 2016-08-11 NOTE — Progress Notes (Signed)
Patient ID: [name] [name], female   DOB: 31-May-1961, 55 y.o.   MRN: [id number] Patient seen in the office today and instructed on use of breo ellipta.  Patient expressed understanding and demonstrated technique.

## 2016-08-11 NOTE — Assessment & Plan Note (Signed)
Moderate Asthma Flare: Plan: We will prescribe your ventolin today. Use this up to every 6 hours as needed for break through shortness of breath or wheezing. We will give you a sample of Breo 100  today. This is maintenance medication for your asthma. Take one puff daily. Rinse mouth after use Ty this for 4 weeks and let us know if it helps Prednisone taper; 10 mg tablets: 4 tabs x 2 days, 3 tabs x 2 days, 2 tabs x 2 days 1 tab x 2 days then stop. CXR today We will Call with results Follow up with NP or Dr. Ashok Cordia in 4 weeks Please contact office for sooner follow up if symptoms do not improve or worsen or seek emergency care

## 2016-08-11 NOTE — Progress Notes (Addendum)
History of Present Illness [name] [name] is a 55 y.o. female with mild intermittent asthma, and history of CAP and R Empyema  In Jan 2017.She is followed by Dr. Ashok Cordia.   08/11/2016 Acute OV: Presents to the office with increased SOB x 3 days with non-productive cough, and chest tightness and wheezing.She denies fever, chest pain, orthopnea or hemoptysis.She is not checking her peak flows. She has been out of her Ventolin x 3-4 days.She does not take a maintenance medication. She is asking for 1 RX for Ventolin to be sent electronically, and asks that we provide a written RX for her to send away for the other doses as she is on disability and can get her medications more economically through a mail order pharmacy. She did use her albuterol neb treatments this morning for her worsening shortness of breath.  Tests CXR 08/11/2016>> Pending  Past medical hx Past Medical History:  Diagnosis Date  . Anxiety   . Asthma   . Carpal tunnel syndrome 01/13/2015   Bilateral  . COPD (chronic obstructive pulmonary disease) ([place])   . Emphysema of lung ([place])   . GERD (gastroesophageal reflux disease)   . Nerve damage    right leg  . Neuromuscular disorder ([facility])    fibromyalgia  . Thyroid disease    hypo thyroid     Past surgical hx, Family hx, Social hx all reviewed.  Current Outpatient Prescriptions on File Prior to Visit  Medication Sig  . albuterol (PROVENTIL HFA;VENTOLIN HFA) 108 (90 Base) MCG/ACT inhaler Inhale 2 puffs into the lungs every 6 (six) hours as needed for wheezing or shortness of breath.  [name] albuterol (PROVENTIL) (2.5 MG/3ML) 0.083% nebulizer solution as needed.  [name] buPROPion (WELLBUTRIN SR) 200 MG 12 hr tablet Take 200 mg by mouth 2 (two) times daily.  [name] gabapentin (NEURONTIN) 600 MG tablet Take 600 mg by mouth 3 (three) times daily.  [name] levothyroxine (SYNTHROID, LEVOTHROID) 137 MCG tablet Take 1 tablet by mouth daily.  [name] morphine (MS CONTIN) 60 MG 12 hr tablet Take 60 mg by  mouth every 12 (twelve) hours.   [name] morphine (MSIR) 15 MG tablet Take 15 mg by mouth every 4 (four) hours as needed for severe pain.  . Multiple Vitamin (MULTIVITAMIN) capsule Take 1 capsule by mouth daily.  . sertraline (ZOLOFT) 100 MG tablet Take 100 mg by mouth daily.  . tizanidine (ZANAFLEX) 2 MG capsule Take 2 mg by mouth 3 times/day as needed-between meals & bedtime. Reported on 12/11/2015   No current facility-administered medications on file prior to visit.      Allergies  Allergen Reactions  . Celebrex [Celecoxib] Other (See Comments)    Severe epigastric pain  . Nsaids Other (See Comments)    SEVERE EPIGASTRIC PAIN  . Percocet [Oxycodone-Acetaminophen] Itching    Review Of Systems:  Constitutional:   No  weight loss, night sweats,  Fevers, chills, fatigue, or  lassitude.  HEENT:   No headaches,  Difficulty swallowing,  Tooth/dental problems, or  Sore throat,                No sneezing, itching, ear ache, nasal congestion, post nasal drip,   CV:  No chest pain,  Orthopnea, PND, swelling in lower extremities, anasarca, dizziness, palpitations, syncope.   GI  No heartburn, indigestion, abdominal pain, nausea, vomiting, diarrhea, change in bowel habits, loss of appetite, bloody stools.   Resp: + shortness of breath with exertion less at rest.  No excess mucus,  no productive cough,  + non-productive cough,  No coughing up of blood.  No change in color of mucus.  + wheezing.  No chest wall deformity  Skin: no rash or lesions.  GU: no dysuria, change in color of urine, no urgency or frequency.  No flank pain, no hematuria   MS:  No joint pain or swelling.  No decreased range of motion.  No back pain.  Psych:  No change in mood or affect. No depression or anxiety.  No memory loss.   Vital Signs BP 128/78 (BP Location: Left Arm, Cuff Size: Normal)   Pulse 88   Ht 5\' 6"  (1.676 m)   Wt 276 lb (125.2 kg)   LMP 10/24/2012   SpO2 93%   BMI 44.55 kg/m    Physical  Exam:  General- No distress,  A&Ox3, pleasant, obese ENT: No sinus tenderness, TM clear, pale nasal mucosa, no oral exudate,no post nasal drip, no LAN Cardiac: S1, S2, regular rate and rhythm, no murmur Chest: + Exp wheeze/ no rales/ dullness; no accessory muscle use, no nasal flaring, no sternal retractions Abd.: Soft Non-tender Ext: No clubbing cyanosis, edema Neuro:  normal strength Skin: No rashes, warm and dry Psych: normal mood and behavior   Assessment/Plan  Asthma with exacerbation Moderate Asthma Flare: Plan: We will prescribe your ventolin today. Use this up to every 6 hours as needed for break through shortness of breath or wheezing. We will give you a sample of Breo 100  today. This is maintenance medication for your asthma. Take one puff daily. Rinse mouth after use Ty this for 4 weeks and let us know if it helps Prednisone taper; 10 mg tablets: 4 tabs x 2 days, 3 tabs x 2 days, 2 tabs x 2 days 1 tab x 2 days then stop. CXR today We will Call with results Follow up with NP or Dr. Ashok Cordia in 4 weeks Please contact office for sooner follow up if symptoms do not improve or worsen or seek emergency care    Will need flu shot at Follow Up in 3-4 weeks if flare has resolved.   Magdalen Spatz, NP 08/11/2016  3:12 PM

## 2016-08-18 DIAGNOSIS — M961 Postlaminectomy syndrome, not elsewhere classified: Secondary | ICD-10-CM | POA: Diagnosis not present

## 2016-08-18 DIAGNOSIS — M1710 Unilateral primary osteoarthritis, unspecified knee: Secondary | ICD-10-CM | POA: Diagnosis not present

## 2016-08-18 DIAGNOSIS — G894 Chronic pain syndrome: Secondary | ICD-10-CM | POA: Diagnosis not present

## 2016-08-25 ENCOUNTER — Telehealth (INDEPENDENT_AMBULATORY_CARE_PROVIDER_SITE_OTHER): Payer: Self-pay | Admitting: Orthopaedic Surgery

## 2016-08-25 ENCOUNTER — Telehealth: Payer: Self-pay | Admitting: Pulmonary Disease

## 2016-08-25 NOTE — Telephone Encounter (Signed)
lmtcb X1 for pt. Pt given breo 100 samples at 11/29 ov with SG.

## 2016-08-25 NOTE — Telephone Encounter (Signed)
Patient would like to discuss L shoulder surgery  (she mentions he was going to shave the bone).  She was told to call back when injection wore off and she is ready to schedule.  She would like to know what you have available in January...she would prefer something like the 1st or 2nd week of Jan.

## 2016-08-27 NOTE — Telephone Encounter (Signed)
lmomtxcb x 2

## 2016-08-30 MED ORDER — FLUTICASONE FUROATE-VILANTEROL 100-25 MCG/INH IN AEPB: 1.0000 | INHALATION_SPRAY | Freq: Every day | RESPIRATORY_TRACT | 2 refills | Status: DC

## 2016-08-30 NOTE — Telephone Encounter (Signed)
rx sent to below listed pharmacy. lmtcb X1 to make pt aware.

## 2016-08-30 NOTE — Telephone Encounter (Signed)
Patient is returning phone call.  °

## 2016-08-30 NOTE — Telephone Encounter (Signed)
Pt says ins does cover this med.[name] [name]

## 2016-09-01 ENCOUNTER — Ambulatory Visit: Payer: PPO | Admitting: Pulmonary Disease

## 2016-09-01 NOTE — Telephone Encounter (Signed)
lmomtcb x 2 for the pt.  

## 2016-09-02 NOTE — Telephone Encounter (Signed)
Spoke with pt. She is aware that we have sent in this prescription. Nothing further was needed.

## 2016-09-15 NOTE — Telephone Encounter (Signed)
I called patient and we scheduled surgery. 

## 2016-10-21 DIAGNOSIS — G8918 Other acute postprocedural pain: Secondary | ICD-10-CM | POA: Diagnosis not present

## 2016-10-21 DIAGNOSIS — M66822 Spontaneous rupture of other tendons, left upper arm: Secondary | ICD-10-CM | POA: Diagnosis not present

## 2016-10-21 DIAGNOSIS — M7542 Impingement syndrome of left shoulder: Secondary | ICD-10-CM | POA: Diagnosis not present

## 2016-10-21 DIAGNOSIS — M7552 Bursitis of left shoulder: Secondary | ICD-10-CM | POA: Diagnosis not present

## 2016-10-28 ENCOUNTER — Ambulatory Visit (INDEPENDENT_AMBULATORY_CARE_PROVIDER_SITE_OTHER): Payer: PPO | Admitting: Orthopaedic Surgery

## 2016-10-28 DIAGNOSIS — Z9889 Other specified postprocedural states: Secondary | ICD-10-CM

## 2016-10-28 NOTE — Progress Notes (Signed)
The patient is now one-week status post a left shoulder arthroscopy with subacromial decompression and extensive debridement. She's been coming out of her sling to work on motion of her shoulder she doing well overall.  On examination her accident and her function is intact. Removed all sutures. She has limited motion of her shoulder secondary to pain and looks like she is doing well overall. I did show her some exercises and we'll order try an unremarkable her to therapy. She'll come out of sling as comfort allows.  At this point we'll see her back in a month to see how she doing overall.

## 2016-11-17 DIAGNOSIS — M1710 Unilateral primary osteoarthritis, unspecified knee: Secondary | ICD-10-CM | POA: Diagnosis not present

## 2016-11-17 DIAGNOSIS — M961 Postlaminectomy syndrome, not elsewhere classified: Secondary | ICD-10-CM | POA: Diagnosis not present

## 2016-11-17 DIAGNOSIS — G894 Chronic pain syndrome: Secondary | ICD-10-CM | POA: Diagnosis not present

## 2016-11-25 ENCOUNTER — Ambulatory Visit (INDEPENDENT_AMBULATORY_CARE_PROVIDER_SITE_OTHER): Payer: PPO | Admitting: Orthopaedic Surgery

## 2016-11-30 ENCOUNTER — Encounter: Payer: Self-pay | Admitting: Family Medicine

## 2016-11-30 ENCOUNTER — Ambulatory Visit (INDEPENDENT_AMBULATORY_CARE_PROVIDER_SITE_OTHER): Payer: PPO | Admitting: Family Medicine

## 2016-11-30 VITALS — BP 122/70 | HR 76 | Temp 98.2°F | Resp 16 | Ht 66.0 in | Wt 264.0 lb

## 2016-11-30 DIAGNOSIS — E559 Vitamin D deficiency, unspecified: Secondary | ICD-10-CM | POA: Diagnosis not present

## 2016-11-30 DIAGNOSIS — G894 Chronic pain syndrome: Secondary | ICD-10-CM | POA: Diagnosis not present

## 2016-11-30 DIAGNOSIS — Z23 Encounter for immunization: Secondary | ICD-10-CM | POA: Diagnosis not present

## 2016-11-30 DIAGNOSIS — Z7689 Persons encountering health services in other specified circumstances: Secondary | ICD-10-CM

## 2016-11-30 DIAGNOSIS — E039 Hypothyroidism, unspecified: Secondary | ICD-10-CM | POA: Diagnosis not present

## 2016-11-30 DIAGNOSIS — Z6841 Body Mass Index (BMI) 40.0 and over, adult: Secondary | ICD-10-CM

## 2016-11-30 NOTE — Patient Instructions (Addendum)
Stay as active as you can manage, walk every day you can Need labs today I will send you a letter with your test results.  If there is anything of concern, we will call right away. Mammogram ordered  Need old records  See me every six months Call sooner for problems

## 2016-11-30 NOTE — Progress Notes (Signed)
Chief Complaint  Patient presents with  . Establish Care   New to establish Old records reviewed for pain, pulmonary, orthopedics Family doctor notes are requested Chronic pain patient trying to reduce her narcotics Is obese and needs to exercise and lose weight Needs management of her hypothyroidism Complains of fatigue and heat intolerance  Sees psychiatrist and is treated for depression.  Is stable. Due for mammogram PAP up to date Needs flu shot Had 2 pneumovax tetanus up to date [place] up to date  Patient Active Problem List   Diagnosis Date Noted  . BMI 40.0-44.9, adult ([place]) 11/30/2016  . Depression with anxiety 09/20/2015  . Chronic pain syndrome 09/19/2015  . MALAISE AND FATIGUE 10/25/2008  . ASTHMA, MILD 08/24/2007  . Hypothyroidism 07/20/2007  . CONSTIPATION NOS 10/17/2006  . LOW BACK PAIN 10/17/2006    Outpatient Encounter Prescriptions as of 11/30/2016  Medication Sig  . albuterol (PROVENTIL HFA;VENTOLIN HFA) 108 (90 Base) MCG/ACT inhaler Inhale 2 puffs into the lungs every 6 (six) hours as needed for wheezing or shortness of breath.  [name] albuterol (PROVENTIL) (2.5 MG/3ML) 0.083% nebulizer solution as needed.  [name] buPROPion (WELLBUTRIN SR) 200 MG 12 hr tablet Take 200 mg by mouth 2 (two) times daily.  . fluticasone furoate-vilanterol (BREO ELLIPTA) 100-25 MCG/INH AEPB Inhale 1 puff into the lungs daily.  [name] gabapentin (NEURONTIN) 600 MG tablet Take 600 mg by mouth 3 (three) times daily.  [name] levothyroxine (SYNTHROID, LEVOTHROID) 150 MCG tablet Take 150 mcg by mouth daily before breakfast.  . morphine (MS CONTIN) 60 MG 12 hr tablet Take 30 mg by mouth every 12 (twelve) hours.   [name] morphine (MSIR) 15 MG tablet Take 15 mg by mouth every 4 (four) hours as needed for severe pain.  [name] sertraline (ZOLOFT) 100 MG tablet Take 100 mg by mouth daily.  . tizanidine (ZANAFLEX) 2 MG capsule Take 2 mg by mouth 3 times/day as needed-between meals & bedtime. Reported on 12/11/2015    No facility-administered encounter medications on file as of 11/30/2016.     Past Medical History:  Diagnosis Date  . Allergy   . Anxiety   . Arthritis    low back  . Asthma   . Carpal tunnel syndrome 01/13/2015   Bilateral  . Depression   . FIBROIDS, UTERUS 10/17/2006   Qualifier: Diagnosis of  By: Jonna Munro MD, Roderic Scarce    . Hyperlipidemia   . Nerve damage    right leg  . Neuromuscular disorder (HCC)    fibromyalgia  . Substance abuse    recovered alcoholic  . Thyroid disease    hypo thyroid    Past Surgical History:  Procedure Laterality Date  . ANTERIOR CERVICAL DECOMP/DISCECTOMY FUSION     L4, L5   . SHOULDER ARTHROSCOPY WITH ROTATOR CUFF REPAIR AND SUBACROMIAL DECOMPRESSION    . TONSILLECTOMY    . VIDEO ASSISTED THORACOSCOPY (VATS)/DECORTICATION Right 09/24/2015   Procedure: VIDEO ASSISTED THORACOSCOPY (VATS)/DECORTICATION;  Surgeon: Melrose Nakayama, MD;  Location: [facility];  Service: Thoracic;  Laterality: Right;  [name] VIDEO BRONCHOSCOPY N/A 09/24/2015   Procedure: VIDEO BRONCHOSCOPY;  Surgeon: Melrose Nakayama, MD;  Location: [facility];  Service: Thoracic;  Laterality: N/A;    Social History   Social History  . Marital status: Significant Other    Spouse name: Dianna  . Number of children: N/A  . Years of education: 36   Occupational History  . Disabled     Electrical engineer   Social History Main  Topics  . Smoking status: Former Smoker    Packs/day: 1.25    Types: E-cigarettes, Cigarettes    Start date: 04/16/1971    Quit date: 09/13/2005  . Smokeless tobacco: Former Systems developer    Types: Snuff, Chew    Quit date: 09/14/2007  . Alcohol use No     Comment: Quit 11/11/1989  . Drug use: No     Comment: Quit in 1991  . Sexual activity: Yes    Birth control/ protection: Post-menopausal   Other Topics Concern  . Not on file   Social History Narrative   Originally from [place], [place]. Previously has lived in [place], [place], [place], [place], [place], [place], [place].  She moved to [name] in 1995. 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 Currently has dogs and a cat and chickens . She has an outdoor hot tub that they use regularly. Previously had mold & mildew in their home for approximately 1 year.    Lives with [place]   Lives on 5 acres       Family History  Problem Relation Age of Onset  . Bone cancer Father   . Asthma Father   . Diabetes Father   . Alcohol abuse Father   . Cancer Father   . COPD Father   . Heart disease Father   . Hypertension Father   . Hyperlipidemia Father   . Pneumonia Mother     biological mother deceased at age 70  . Depression Mother   . Alcohol abuse Sister   . Alcohol abuse Brother   . Alcohol abuse Sister   . Alcohol abuse Brother   . Colon cancer Neg Hx   . Esophageal cancer Neg Hx   . Stomach cancer Neg Hx   . Rectal cancer Neg Hx     Review of Systems  Constitutional: Positive for malaise/fatigue. Negative for chills, fever and weight loss.  HENT: Negative for congestion and hearing loss.   Eyes: Negative for blurred vision and pain.  Respiratory: Negative for cough and shortness of breath.   Cardiovascular: Negative for chest pain and leg swelling.  Gastrointestinal: Negative for abdominal pain, constipation, diarrhea and heartburn.  Genitourinary: Negative for dysuria and frequency.  Musculoskeletal: Positive for back pain, myalgias and neck pain. Negative for falls and joint pain.  Neurological: Positive for sensory change. Negative for dizziness, seizures and headaches.  Psychiatric/Behavioral: Negative for depression. The patient is not nervous/anxious and does not have insomnia.     BP 122/70 (BP Location: Right Arm, Patient Position: Sitting, Cuff Size: Large)   Pulse 76   Temp 98.2 F (36.8 C) (Temporal)   Resp 16   Ht 5\' 6"  (1.676 m)   Wt 264 lb (119.7 kg)   LMP 10/24/2012   SpO2 94%   BMI 42.61 kg/m   Physical Exam   Constitutional: She is oriented to person, place, and time. She appears well-developed and well-nourished.  Moderately stiff-uncomfortable  HENT:  Head: Normocephalic and atraumatic.  Right Ear: External ear normal.  Left Ear: External ear normal.  Mouth/Throat: Oropharynx is clear and moist.  Eyes: Conjunctivae are normal. Pupils are equal, round, and reactive to light.  Neck: Normal range of motion. Neck supple. No thyromegaly present.  Cardiovascular: Normal rate, regular rhythm and normal heart sounds.   Pulmonary/Chest: Effort normal and breath sounds normal.  Musculoskeletal: She exhibits no edema.  Lymphadenopathy:    She  has no cervical adenopathy.  Neurological: She is alert and oriented to person, place, and time. She displays abnormal reflex.  Psychiatric: She has a normal mood and affect. Her behavior is normal.   ASSESSMENT/PLAN:   1. BMI 40.0-44.9, adult ([place])   2. Hypothyroidism, unspecified type  - CBC - Comprehensive metabolic panel - Lipid panel - VITAMIN D 25 Hydroxy (Vit-D Deficiency, Fractures) - Urinalysis, Routine w reflex microscopic - TSH - MM Digital Screening; Future  3. Chronic pain syndrome   4. Encounter to establish care with new doctor  - MM Digital Screening; Future - Flu Vaccine QUAD 36+ mos PF IM (Fluarix & Fluzone Quad PF)   Patient Instructions  Stay as active as you can manage, walk every day you can Need labs today I will send you a letter with your test results.  If there is anything of concern, we will call right away. Mammogram ordered  Need old records  See me every six months Call sooner for problems    Raylene Everts, MD

## 2016-12-01 ENCOUNTER — Encounter: Payer: Self-pay | Admitting: Family Medicine

## 2016-12-01 ENCOUNTER — Telehealth: Payer: Self-pay

## 2016-12-01 DIAGNOSIS — E039 Hypothyroidism, unspecified: Secondary | ICD-10-CM

## 2016-12-01 DIAGNOSIS — E669 Obesity, unspecified: Secondary | ICD-10-CM

## 2016-12-01 LAB — CBC
HCT: 44 % (ref 35.0–45.0)
Hemoglobin: 14.4 g/dL (ref 11.7–15.5)
MCH: 30.5 pg (ref 27.0–33.0)
MCHC: 32.7 g/dL (ref 32.0–36.0)
MCV: 93.2 fL (ref 80.0–100.0)
MPV: 9.3 fL (ref 7.5–12.5)
Platelets: 206 10*3/uL (ref 140–400)
RBC: 4.72 MIL/uL (ref 3.80–5.10)
RDW: 12.8 % (ref 11.0–15.0)
WBC: 7.9 10*3/uL (ref 3.8–10.8)

## 2016-12-01 LAB — URINALYSIS, ROUTINE W REFLEX MICROSCOPIC
Bilirubin Urine: NEGATIVE
Glucose, UA: NEGATIVE
Hgb urine dipstick: NEGATIVE
Ketones, ur: NEGATIVE
Leukocytes, UA: NEGATIVE
Nitrite: NEGATIVE
Protein, ur: NEGATIVE
Specific Gravity, Urine: 1.025 (ref 1.001–1.035)
pH: 5.5 (ref 5.0–8.0)

## 2016-12-01 LAB — COMPREHENSIVE METABOLIC PANEL
ALT: 29 U/L (ref 6–29)
AST: 21 U/L (ref 10–35)
Albumin: 4.2 g/dL (ref 3.6–5.1)
Alkaline Phosphatase: 94 U/L (ref 33–130)
BUN: 21 mg/dL (ref 7–25)
CO2: 26 mmol/L (ref 20–31)
Calcium: 9.3 mg/dL (ref 8.6–10.4)
Chloride: 102 mmol/L (ref 98–110)
Creat: 0.83 mg/dL (ref 0.50–1.05)
Glucose, Bld: 152 mg/dL — ABNORMAL HIGH (ref 65–99)
Potassium: 4.4 mmol/L (ref 3.5–5.3)
Sodium: 141 mmol/L (ref 135–146)
Total Bilirubin: 0.3 mg/dL (ref 0.2–1.2)
Total Protein: 7 g/dL (ref 6.1–8.1)

## 2016-12-01 LAB — LIPID PANEL
Cholesterol: 231 mg/dL — ABNORMAL HIGH (ref <200)
HDL: 39 mg/dL — ABNORMAL LOW (ref >50)
LDL Cholesterol: 159 mg/dL — ABNORMAL HIGH (ref <100)
Total CHOL/HDL Ratio: 5.9 Ratio — ABNORMAL HIGH (ref <5.0)
Triglycerides: 166 mg/dL — ABNORMAL HIGH (ref <150)
VLDL: 33 mg/dL — ABNORMAL HIGH (ref <30)

## 2016-12-01 LAB — TSH: TSH: 3.22 mIU/L

## 2016-12-01 LAB — VITAMIN D 25 HYDROXY (VIT D DEFICIENCY, FRACTURES): Vit D, 25-Hydroxy: 18 ng/mL — ABNORMAL LOW (ref 30–100)

## 2016-12-01 MED ORDER — LEVOTHYROXINE SODIUM 175 MCG PO TABS: 175.0000 ug | ORAL_TABLET | Freq: Every day | ORAL | 0 refills | Status: DC

## 2016-12-02 ENCOUNTER — Telehealth: Payer: Self-pay | Admitting: Pulmonary Disease

## 2016-12-02 NOTE — Telephone Encounter (Signed)
error 

## 2016-12-02 NOTE — Telephone Encounter (Signed)
A fax was sent to [facility] at [phone] for Breo 100 with quantity of 1 and 11 refills. Directions states to inhale 1 puff into the lungs daily. A confirmation fax was received. Nothing further is needed.

## 2016-12-06 ENCOUNTER — Encounter (INDEPENDENT_AMBULATORY_CARE_PROVIDER_SITE_OTHER): Payer: Self-pay | Admitting: Physician Assistant

## 2016-12-06 ENCOUNTER — Ambulatory Visit (INDEPENDENT_AMBULATORY_CARE_PROVIDER_SITE_OTHER): Payer: PPO | Admitting: Physician Assistant

## 2016-12-06 DIAGNOSIS — Z9889 Other specified postprocedural states: Secondary | ICD-10-CM

## 2016-12-06 NOTE — Progress Notes (Deleted)
Office Visit Note   Patient: [name] [name]           Date of Birth: Jan 02, 1955           MRN: [id number] Visit Date: 12/06/2016              Requested by: Antony Blackbird, MD [address], [place] [address] PCP: Antony Blackbird, MD   Assessment & Plan: Visit Diagnoses: No diagnosis found.  Plan: ***  Follow-Up Instructions: No Follow-up on file.   Orders:  No orders of the defined types were placed in this encounter.  No orders of the defined types were placed in this encounter.     Procedures: No procedures performed   Clinical Data: No additional findings.   Subjective: Chief Complaint  56 presents with  . Left Shoulder - Routine Post Op    Patient returns for follow up. She is status post left shoulder arthroscopy, subacromial decompression, and extensive debridement. She is about 6 1/2 weeks post op. She can raise her arm, but has some pain. She states that the shoulder hurts like a toothache. She has been doing the home exercises. She cannot twist her arm or pick up a gallon of milk. She did some shoveling about 3 weeks ago and thinks she set it back a bit. She is on morphine for chronic pain.     Review of Systems   Objective: Vital Signs: LMP 10/24/2012   Physical Exam  Ortho Exam  Specialty Comments:  No specialty comments available.  Imaging: No results found.   PMFS History: Patient Active Problem List   Diagnosis Date Noted  . BMI 40.0-44.9, adult ([facility]) 11/30/2016  . Depression with anxiety 09/20/2015  . Chronic pain syndrome 09/19/2015  . MALAISE AND FATIGUE 10/25/2008  . ASTHMA, MILD 08/24/2007  . Hypothyroidism 07/20/2007  . CONSTIPATION NOS 10/17/2006  . LOW BACK PAIN 10/17/2006   Past Medical History:  Diagnosis Date  . Allergy   . Anxiety   . Arthritis    low back  . Asthma   . Carpal tunnel syndrome 01/13/2015   Bilateral  . Depression   . FIBROIDS, UTERUS 10/17/2006   Qualifier: Diagnosis of  By: Jonna Munro  MD, Roderic Scarce    . Hyperlipidemia   . Nerve damage    right leg  . Neuromuscular disorder (HCC)    fibromyalgia  . Substance abuse    recovered alcoholic  . Thyroid disease    hypo thyroid    Family History  Problem Relation Age of Onset  . Bone cancer Father   . Asthma Father   . Diabetes Father   . Alcohol abuse Father   . Cancer Father   . COPD Father   . Heart disease Father   . Hypertension Father   . Hyperlipidemia Father   . Pneumonia Mother     biological mother deceased at age 47  . Depression Mother   . Alcohol abuse Sister   . Alcohol abuse Brother   . Alcohol abuse Sister   . Alcohol abuse Brother   . Colon cancer Neg Hx   . Esophageal cancer Neg Hx   . Stomach cancer Neg Hx   . Rectal cancer Neg Hx     Past Surgical History:  Procedure Laterality Date  . ANTERIOR CERVICAL DECOMP/DISCECTOMY FUSION     L4, L5   . SHOULDER ARTHROSCOPY WITH ROTATOR CUFF REPAIR AND SUBACROMIAL DECOMPRESSION    . TONSILLECTOMY    . VIDEO  ASSISTED THORACOSCOPY (VATS)/DECORTICATION Right 09/24/2015   Procedure: VIDEO ASSISTED THORACOSCOPY (VATS)/DECORTICATION;  Surgeon: Melrose Nakayama, MD;  Location: [facility];  Service: Thoracic;  Laterality: Right;  [name] VIDEO BRONCHOSCOPY N/A 09/24/2015   Procedure: VIDEO BRONCHOSCOPY;  Surgeon: Melrose Nakayama, MD;  Location: [facility];  Service: Thoracic;  Laterality: N/A;   Social History   Occupational History  . Disabled     Electrical engineer   Social History Main Topics  . Smoking status: Former Smoker    Packs/day: 1.25    Types: E-cigarettes, Cigarettes    Start date: 04/16/1971    Quit date: 09/13/2005  . Smokeless tobacco: Former Systems developer    Types: Snuff, Chew    Quit date: 09/14/2007  . Alcohol use No     Comment: Quit 11/11/1989  . Drug use: No     Comment: Quit in 1991  . Sexual activity: Yes    Birth control/ protection: Post-menopausal

## 2016-12-06 NOTE — Progress Notes (Signed)
Office Visit Note   Patient: [name] [name]           Date of Birth: [birth year]           MRN: [id number] Visit Date: 12/06/2016              Requested by: Antony Blackbird, MD [address], [place] [address] PCP: Antony Blackbird, MD   Assessment & Plan: Visit Diagnoses:  1. S/P arthroscopy of shoulder     Plan: Just with her sending her to formal physical therapy she would really do exercises at home and therefore I gave her some therapy and an exercises handouts for her shoulder. We'll see her back in a month check progress lack of.If she still having soreness may consider subacromial injection  Follow-Up Instructions: Return in about 4 weeks (around 01/03/2017).   Orders:  No orders of the defined types were placed in this encounter.  No orders of the defined types were placed in this encounter.     Procedures: No procedures performed   Clinical Data: No additional findings.   Subjective: Chief Complaint  Patient presents with  . Left Shoulder - Routine Post Op    HPI [name] [name] returns today almost 6 weeks status post left shoulder arthroscopy with subacromial decompression and extensive debridement. All overall trending towards improvement. Still has soreness in the shoulder though. She states did some shoveling snow about 3 weeks ago and feels to set her back some. Review of Systems   Objective: Vital Signs: LMP 10/24/2012   Physical Exam  Ortho Exam Older she has full forward flexion. Limited external rotation. Abduction to at least 120. 5 out of 5 strengths with external and internal rotation against resistance. Specialty Comments:  No specialty comments available.  Imaging: No results found.   PMFS History: Patient Active Problem List   Diagnosis Date Noted  . BMI 40.0-44.9, adult ([facility]) 11/30/2016  . Depression with anxiety 09/20/2015  . Chronic pain syndrome 09/19/2015  . MALAISE AND FATIGUE 10/25/2008  . ASTHMA, MILD 08/24/2007  .  Hypothyroidism 07/20/2007  . CONSTIPATION NOS 10/17/2006  . LOW BACK PAIN 10/17/2006   Past Medical History:  Diagnosis Date  . Allergy   . Anxiety   . Arthritis    low back  . Asthma   . Carpal tunnel syndrome 01/13/2015   Bilateral  . Depression   . FIBROIDS, UTERUS 10/17/2006   Qualifier: Diagnosis of  By: Jonna Munro MD, Roderic Scarce    . Hyperlipidemia   . Nerve damage    right leg  . Neuromuscular disorder (HCC)    fibromyalgia  . Substance abuse    recovered alcoholic  . Thyroid disease    hypo thyroid    Family History  Problem Relation Age of Onset  . Bone cancer Father   . Asthma Father   . Diabetes Father   . Alcohol abuse Father   . Cancer Father   . COPD Father   . Heart disease Father   . Hypertension Father   . Hyperlipidemia Father   . Pneumonia Mother     biological mother deceased at age 19  . Depression Mother   . Alcohol abuse Sister   . Alcohol abuse Brother   . Alcohol abuse Sister   . Alcohol abuse Brother   . Colon cancer Neg Hx   . Esophageal cancer Neg Hx   . Stomach cancer Neg Hx   . Rectal cancer Neg Hx  Past Surgical History:  Procedure Laterality Date  . ANTERIOR CERVICAL DECOMP/DISCECTOMY FUSION     L4, L5   . SHOULDER ARTHROSCOPY WITH ROTATOR CUFF REPAIR AND SUBACROMIAL DECOMPRESSION    . TONSILLECTOMY    . VIDEO ASSISTED THORACOSCOPY (VATS)/DECORTICATION Right 09/24/2015   Procedure: VIDEO ASSISTED THORACOSCOPY (VATS)/DECORTICATION;  Surgeon: Melrose Nakayama, MD;  Location: [facility];  Service: Thoracic;  Laterality: Right;  [name] VIDEO BRONCHOSCOPY N/A 09/24/2015   Procedure: VIDEO BRONCHOSCOPY;  Surgeon: Melrose Nakayama, MD;  Location: [facility];  Service: Thoracic;  Laterality: N/A;   Social History   Occupational History  . Disabled     Electrical engineer   Social History Main Topics  . Smoking status: Former Smoker    Packs/day: 1.25    Types: E-cigarettes, Cigarettes    Start date: 04/16/1971    Quit date: 09/13/2005  .  Smokeless tobacco: Former Systems developer    Types: Snuff, Chew    Quit date: 09/14/2007  . Alcohol use No     Comment: Quit 11/11/1989  . Drug use: No     Comment: Quit in 1991  . Sexual activity: Yes    Birth control/ protection: Post-menopausal

## 2017-01-05 ENCOUNTER — Ambulatory Visit (INDEPENDENT_AMBULATORY_CARE_PROVIDER_SITE_OTHER): Payer: PPO | Admitting: Physician Assistant

## 2017-01-06 ENCOUNTER — Ambulatory Visit (INDEPENDENT_AMBULATORY_CARE_PROVIDER_SITE_OTHER): Payer: PPO | Admitting: Physician Assistant

## 2017-01-11 ENCOUNTER — Other Ambulatory Visit: Payer: Self-pay | Admitting: Family Medicine

## 2017-01-11 DIAGNOSIS — G8929 Other chronic pain: Secondary | ICD-10-CM | POA: Diagnosis not present

## 2017-01-11 DIAGNOSIS — M1710 Unilateral primary osteoarthritis, unspecified knee: Secondary | ICD-10-CM | POA: Diagnosis not present

## 2017-01-11 DIAGNOSIS — M961 Postlaminectomy syndrome, not elsewhere classified: Secondary | ICD-10-CM | POA: Diagnosis not present

## 2017-01-11 DIAGNOSIS — Z5181 Encounter for therapeutic drug level monitoring: Secondary | ICD-10-CM | POA: Diagnosis not present

## 2017-01-11 DIAGNOSIS — G894 Chronic pain syndrome: Secondary | ICD-10-CM | POA: Diagnosis not present

## 2017-01-11 DIAGNOSIS — Z1231 Encounter for screening mammogram for malignant neoplasm of breast: Secondary | ICD-10-CM

## 2017-01-11 DIAGNOSIS — M545 Low back pain: Secondary | ICD-10-CM | POA: Diagnosis not present

## 2017-01-11 DIAGNOSIS — M7061 Trochanteric bursitis, right hip: Secondary | ICD-10-CM | POA: Diagnosis not present

## 2017-01-19 ENCOUNTER — Ambulatory Visit (HOSPITAL_COMMUNITY): Payer: PPO

## 2017-01-19 ENCOUNTER — Ambulatory Visit (INDEPENDENT_AMBULATORY_CARE_PROVIDER_SITE_OTHER): Payer: PPO | Admitting: Physician Assistant

## 2017-01-21 ENCOUNTER — Ambulatory Visit (HOSPITAL_COMMUNITY): Payer: PPO

## 2017-02-14 ENCOUNTER — Other Ambulatory Visit: Payer: Self-pay | Admitting: Family Medicine

## 2017-02-21 ENCOUNTER — Other Ambulatory Visit: Payer: Self-pay

## 2017-02-28 ENCOUNTER — Ambulatory Visit (INDEPENDENT_AMBULATORY_CARE_PROVIDER_SITE_OTHER): Payer: PPO | Admitting: Physician Assistant

## 2017-03-31 ENCOUNTER — Ambulatory Visit (INDEPENDENT_AMBULATORY_CARE_PROVIDER_SITE_OTHER): Payer: PPO | Admitting: Pulmonary Disease

## 2017-03-31 ENCOUNTER — Encounter: Payer: Self-pay | Admitting: Pulmonary Disease

## 2017-03-31 VITALS — BP 140/80 | HR 77 | Ht 66.0 in | Wt 267.0 lb

## 2017-03-31 DIAGNOSIS — J4530 Mild persistent asthma, uncomplicated: Secondary | ICD-10-CM | POA: Diagnosis not present

## 2017-03-31 DIAGNOSIS — J4520 Mild intermittent asthma, uncomplicated: Secondary | ICD-10-CM

## 2017-03-31 DIAGNOSIS — J984 Other disorders of lung: Secondary | ICD-10-CM

## 2017-03-31 DIAGNOSIS — F17200 Nicotine dependence, unspecified, uncomplicated: Secondary | ICD-10-CM

## 2017-03-31 LAB — PULMONARY FUNCTION TEST
DL/VA % pred: 107 %
DL/VA: 5.43 ml/min/mmHg/L
DLCO unc % pred: 95 %
DLCO unc: 25.63 ml/min/mmHg
FEF 25-75 Post: 2.18 L/sec
FEF 25-75 Pre: 1.65 L/sec
FEF2575-%Change-Post: 32 %
FEF2575-%Pred-Post: 82 %
FEF2575-%Pred-Pre: 62 %
FEV1-%Change-Post: 4 %
FEV1-%Pred-Post: 74 %
FEV1-%Pred-Pre: 71 %
FEV1-Post: 2.13 L
FEV1-Pre: 2.04 L
FEV1FVC-%Change-Post: 3 %
FEV1FVC-%Pred-Pre: 98 %
FEV6-%Change-Post: 0 %
FEV6-%Pred-Post: 74 %
FEV6-%Pred-Pre: 74 %
FEV6-Post: 2.65 L
FEV6-Pre: 2.63 L
FEV6FVC-%Pred-Post: 103 %
FEV6FVC-%Pred-Pre: 103 %
FVC-%Change-Post: 0 %
FVC-%Pred-Post: 72 %
FVC-%Pred-Pre: 71 %
FVC-Post: 2.65 L
FVC-Pre: 2.63 L
Post FEV1/FVC ratio: 81 %
Post FEV6/FVC ratio: 100 %
Pre FEV1/FVC ratio: 78 %
Pre FEV6/FVC Ratio: 100 %

## 2017-03-31 MED ORDER — FLUTICASONE FUROATE-VILANTEROL 200-25 MCG/INH IN AEPB: 1.0000 | INHALATION_SPRAY | Freq: Every day | RESPIRATORY_TRACT | 0 refills | Status: DC

## 2017-03-31 MED ORDER — ALBUTEROL SULFATE HFA 108 (90 BASE) MCG/ACT IN AERS: 2.0000 | INHALATION_SPRAY | RESPIRATORY_TRACT | 5 refills | Status: DC | PRN

## 2017-03-31 MED ORDER — ALBUTEROL SULFATE (2.5 MG/3ML) 0.083% IN NEBU: 2.5000 mg | INHALATION_SOLUTION | RESPIRATORY_TRACT | 3 refills | Status: DC | PRN

## 2017-03-31 NOTE — Patient Instructions (Addendum)
   Continue using the samples of Breo we gave you today.  Call or e-mail back to let me know what inhalers would be more affordable with your insurance.   Let me know if you want to try Tessalon Perles to help your cough if it gets worse.  I will see you back in 3 months or sooner if needed.

## 2017-03-31 NOTE — Progress Notes (Signed)
Subjective:    Patient ID: [name] [name], female    DOB: [birth year], 56 y.o.   MRN: [id number]  C.C.:  Follow-up for Mild, Intermittent Asthma & Mild Restrictive Lung Disease.  HPI Mild, intermittent asthma: Patient prescribed an albuterol inhaler as needed. Patient was last seen in November 2017 for acute exacerbation of asthma treated with a prednisone taper. Patient was given a sample of Breo 100 at that time and prescription was continued. She reports a few months ago (May) she noticed increased dyspnea. Dyspnea increased as did her cough with her tobacco use. Cough remains nonproductive. No wheezing. The [facility] is too expensive for her at $45/mo co-pay. She has been out of the medication for 4 days. She is using her rescue inhaler more frequently as well as her nebulizer treatments with improvement in her coughing spells.   Mild restrictive lung disease: Found on lung volumes today. Patient status post VATS and decortication in January 2017. Also has some central obesity.   Tobacco use disorder:  She smoked last this morning. She smokes 2-3 cigarettes daily with her partner as her partner has restarted.   Review of Systems She reports chronic chest "tightness". No chest pain or pressure. No fever, chills, or sweats. No abdominal pain, nausea or emesis.   Allergies  Allergen Reactions  . Celebrex [Celecoxib] Nausea And Vomiting    Severe epigastric pain  . Nsaids Other (See Comments)    SEVERE EPIGASTRIC PAIN  . Percocet [Oxycodone-Acetaminophen] Itching    Current Outpatient Prescriptions on File Prior to Visit  Medication Sig Dispense Refill  . albuterol (PROVENTIL HFA;VENTOLIN HFA) 108 (90 Base) MCG/ACT inhaler Inhale 2 puffs into the lungs every 6 (six) hours as needed for wheezing or shortness of breath.    [name] albuterol (PROVENTIL) (2.5 MG/3ML) 0.083% nebulizer solution as needed.    [name] buPROPion (WELLBUTRIN SR) 200 MG 12 hr tablet Take 200 mg by mouth 2 (two) times daily.    .  fluticasone furoate-vilanterol (BREO ELLIPTA) 100-25 MCG/INH AEPB Inhale 1 puff into the lungs daily. 60 each 2  . gabapentin (NEURONTIN) 600 MG tablet Take 600 mg by mouth 3 (three) times daily.    [name] levothyroxine (SYNTHROID, LEVOTHROID) 175 MCG tablet TAKE 1 TABLET BY MOUTH DAILY BEFORE BREAKFAST. 90 tablet 1  . morphine (MS CONTIN) 60 MG 12 hr tablet Take 30 mg by mouth every 12 (twelve) hours.     [name] morphine (MSIR) 15 MG tablet Take 15 mg by mouth every 4 (four) hours as needed for severe pain.    [name] sertraline (ZOLOFT) 100 MG tablet Take 100 mg by mouth daily.    . tizanidine (ZANAFLEX) 2 MG capsule Take 2 mg by mouth 3 times/day as needed-between meals & bedtime. Reported on 12/11/2015     No current facility-administered medications on file prior to visit.     Past Medical History:  Diagnosis Date  . Allergy   . Anxiety   . Arthritis    low back  . Asthma   . Carpal tunnel syndrome 01/13/2015   Bilateral  . Depression   . FIBROIDS, UTERUS 10/17/2006   Qualifier: Diagnosis of  By: Jonna Munro MD, Roderic Scarce    . Hyperlipidemia   . Nerve damage    right leg  . Neuromuscular disorder (HCC)    fibromyalgia  . Substance abuse    recovered alcoholic  . Thyroid disease    hypo thyroid    Past Surgical History:  Procedure Laterality Date  .  ANTERIOR CERVICAL DECOMP/DISCECTOMY FUSION     L4, L5   . SHOULDER ARTHROSCOPY WITH ROTATOR CUFF REPAIR AND SUBACROMIAL DECOMPRESSION    . TONSILLECTOMY    . VIDEO ASSISTED THORACOSCOPY (VATS)/DECORTICATION Right 09/24/2015   Procedure: VIDEO ASSISTED THORACOSCOPY (VATS)/DECORTICATION;  Surgeon: Melrose Nakayama, MD;  Location: [facility];  Service: Thoracic;  Laterality: Right;  [name] VIDEO BRONCHOSCOPY N/A 09/24/2015   Procedure: VIDEO BRONCHOSCOPY;  Surgeon: Melrose Nakayama, MD;  Location: [facility] OR;  Service: Thoracic;  Laterality: N/A;    Family History  Problem Relation Age of Onset  . Bone cancer Father   . Asthma Father   . Diabetes Father    . Alcohol abuse Father   . Cancer Father   . COPD Father   . Heart disease Father   . Hypertension Father   . Hyperlipidemia Father   . Pneumonia Mother        biological mother deceased at age 31  . Depression Mother   . Alcohol abuse Sister   . Alcohol abuse Brother   . Alcohol abuse Sister   . Alcohol abuse Brother   . Colon cancer Neg Hx   . Esophageal cancer Neg Hx   . Stomach cancer Neg Hx   . Rectal cancer Neg Hx     Social History   Social History  . Marital status: Significant Other    Spouse name: Dianna  . Number of children: N/A  . Years of education: 62   Occupational History  . Disabled     Electrical engineer   Social History Main Topics  . Smoking status: Current Every Day Smoker    Packs/day: 0.10    Types: E-cigarettes, Cigarettes    Start date: 04/16/1971  . Smokeless tobacco: Former Systems developer    Types: Snuff, Chew    Quit date: 09/14/2007  . Alcohol use No     Comment: Quit 11/11/1989  . Drug use: No     Comment: Quit in 1991  . Sexual activity: Yes    Birth control/ protection: Post-menopausal   Other Topics Concern  . None   Social History Narrative   Originally from [place], [place]. Previously has lived in [place], [place], [place], [place], [place], [place], [place]. She moved to [place] in 1995. 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 Currently has dogs and a cat and chickens . She has an outdoor hot tub that they use regularly. Previously had mold & mildew in their home for approximately 1 year.    Lives with [place]   [place] acres         Objective:   Physical Exam BP 140/80 (BP Location: Right Arm, Patient Position: Sitting, Cuff Size: Large)   Pulse 77   Ht 5\' 6"  (1.676 m)   Wt 267 lb (121.1 kg)   LMP 10/24/2012   SpO2 94%   BMI 43.09 kg/m   General:  Awake. Alert. No acute distress. Central obesity. Integument:  Warm & dry. No rash on exposed skin.  Extremities:  No  cyanosis or clubbing.  HEENT:  Moist mucus membranes. Moderate bilateral nasal reported swelling. No oral ulcers. Cardiovascular:  Regular rate. No edema. Normal S1 & S2.  Pulmonary:  Good aeration & clear to auscultation bilaterally. Symmetric chest wall expansion. No accessory muscle use on room air. Abdomen: Soft. Normal bowel sounds. Protuberant. Musculoskeletal:  Normal bulk and tone. No joint deformity or  effusion appreciated.  PFT 03/31/17: FVC 2.63 L (71%) FEV1 2.04 L (71%) FEV1/FVC 0.78 FEF 25-75 1.65 L (62%) negative bronchodilator response TLC 3.91 L (73%) RV 68% ERV 27% DLCO uncorrected 95% 04/28/16: FVC 2.70 L (73%) FEV1 2.00 L (69%) FEV1/FVC 0.74 FEF 25-75 1.14 L (53%) negative bronchodilator response TLC 5.11 L (95%) RV 113% ERV 7% DLCO corrected 97% (hgb 15.7)  6MWT 04/28/16:  Walked 452 meters / Baseline Sat 100% on RA / Nadir Sat 94% on RA  IMAGING CXR PA/LAT 08/11/16 (personally reviewed by me):  No parenchymal opacity or mass appreciated. No pleural effusion. Heart normal in size & mediastinum normal in contour.  CXR PA/LAT 10/30/15 (previously reviewed by me): Pleural thickening on the right consistent with prior VATS and decortication on the right. No appreciated pleural fluid. No parenchymal nodule or mass appreciated. Heart normal in size. Mediastinum normal in contour.  PATHOLOGY Right Pleural Fluid (09/24/15):  No malignant cells. Right Pleural Fluid (09/22/15):  No malignant cells. Right Pleural Peel (09/22/15):  No atypia or malignancy identified. Benign organizing fibrin inflammatory debris.  MICROBIOLOGY Right Pleura Visceral & Parietal (09/24/15): Negative Right Pleural Fluid (09/24/15):  Negative  Right Pleural Fluid (09/24/15):  Negative  Right Bronchial Wash (09/24/15):  Negative  Right Pleural Fluid (09/22/15):  Negative   LABS PFA (09/22/15) Glucose:  <20 LDH:  3339 Total protein: 5.2 (serum 6.7) PH: 7.4 WBC:  7830 (neutro 36%, monocytes 53%, lymphs 11%)      Assessment & Plan:  56 y.o. female post VATS and decortication for right-sided empyema in January 2017. Patient with mild, intermittent asthma along with evidence of mild restriction on lung volumes today. Patient's spirometry shows no evidence of fixed airway obstruction or decline despite her ongoing tobacco use. Additionally, she has no evidence of a significant bronchodilator response. I spent a significant amount of time today discussing the need for complete tobacco cessation as this is likely the main cause for her worsening cough and respiratory problems at this time. I instructed the patient contact me for any new breathing problems or questions before next appointment.  1. Mild, intermittent asthma:  Worsening with current tobacco use. Patient given samples of Breo 200 today. She will contact my office with a list of alternative inhaler as that may be more affordable. Refilled patient's rescue inhaler and albuterol medication to use in her nebulizer.  2. Mild restrictive lung disease:  Likely multifactorial from obesity and prior VATS. No need for further testing. 3. Tobacco use disorder:  Patient counseled for 3 necessary for complete tobacco cessation. 4. Health maintenance: Status post Prevnar vaccine March 2017, Pneumovax November 2008, & Tdap July 2016. plan for Pneumovax at next appointment. 5. Follow-up: Return to clinic in 3 months or sooner if needed.  Sonia Baller Ashok Cordia, M.D. [facility] Pulmonary & Critical Care Pager:  [phone] After 3pm or if no response, call [phone] 4:13 PM 03/31/17

## 2017-03-31 NOTE — Progress Notes (Signed)
PFT done today. 

## 2017-04-04 ENCOUNTER — Telehealth: Payer: Self-pay | Admitting: Pulmonary Disease

## 2017-04-04 NOTE — Telephone Encounter (Signed)
Tessalon Perles 100mg  - 1-2 cap po tid prn cough - #90 with 1 refill.

## 2017-04-04 NOTE — Telephone Encounter (Signed)
Left message for patient to call back.   In the event that she does call back, JN, would you like for her to have the 100mg  or 200mg  strength perles. Please advise. Thanks!

## 2017-04-04 NOTE — Telephone Encounter (Signed)
lmtcb for pt.  Will send in rx after verifying pharmacy.

## 2017-04-05 MED ORDER — BENZONATATE 100 MG PO CAPS: ORAL_CAPSULE | ORAL | 1 refills | Status: DC

## 2017-04-05 MED ORDER — FLUTICASONE FUROATE-VILANTEROL 200-25 MCG/INH IN AEPB: 1.0000 | INHALATION_SPRAY | Freq: Every day | RESPIRATORY_TRACT | 6 refills | Status: DC

## 2017-04-05 MED ORDER — PREDNISONE 10 MG PO TABS: ORAL_TABLET | ORAL | 0 refills | Status: DC

## 2017-04-05 NOTE — Telephone Encounter (Signed)
Patient returning call - she is also requesting steroid to be sent to [facility] - pt can be reached at [phone] -pr

## 2017-04-05 NOTE — Telephone Encounter (Signed)
Pt aware that [employer] have been called in.  Pt is now requesting steroids be called into pharmacy - pt having chest tightness/heaviness. Denies SOB. Pt has been using her Albuterol nebulizer more frequently d/t symptoms.  Pt is wanting to stay with [facility] -- wants this called into her pharmacy with the Steroids.   Please advise Dr Ashok Cordia. Thanks.

## 2017-04-05 NOTE — Telephone Encounter (Signed)
Please send in a Rx for Breo 200 - #1 ellipta - 6 refills.  Please send in a Rx for Prednisone 40mg  daily x4 days.  Thanks.

## 2017-04-05 NOTE — Telephone Encounter (Signed)
Spoke with patient. She is aware of the medications. They have been called into the pharmacy for her. Nothing else was needed at the time of call.

## 2017-04-05 NOTE — Telephone Encounter (Signed)
lmtcb x1 for pt in regards to steroids. Rx for Lavella Lemons has been sent to preferred pharmacy

## 2017-04-05 NOTE — Telephone Encounter (Signed)
Patient is returning phone call regarding prescription request [phone]

## 2017-04-25 ENCOUNTER — Telehealth: Payer: Self-pay | Admitting: Pulmonary Disease

## 2017-04-25 NOTE — Telephone Encounter (Signed)
lmtcb for pt.  

## 2017-04-26 MED ORDER — ALBUTEROL SULFATE HFA 108 (90 BASE) MCG/ACT IN AERS: 1.0000 | INHALATION_SPRAY | RESPIRATORY_TRACT | 0 refills | Status: DC | PRN

## 2017-04-26 NOTE — Telephone Encounter (Signed)
lmtcb x2 for pt. 

## 2017-04-26 NOTE — Telephone Encounter (Signed)
Spoke with pt. She is needing #1 Ventolin inhaler sent to her local pharmacy. Rx has been sent in. Nothing further was needed.

## 2017-04-26 NOTE — Telephone Encounter (Signed)
Pt calling back about inhaler.  Pt will have her phone on her.

## 2017-05-04 ENCOUNTER — Telehealth (INDEPENDENT_AMBULATORY_CARE_PROVIDER_SITE_OTHER): Payer: Self-pay | Admitting: Orthopaedic Surgery

## 2017-05-04 NOTE — Telephone Encounter (Signed)
Returned call to patient left message to call back to schedule appointment   [phone]

## 2017-05-10 ENCOUNTER — Ambulatory Visit: Payer: PPO | Admitting: Pulmonary Disease

## 2017-05-11 ENCOUNTER — Telehealth: Payer: Self-pay | Admitting: Pulmonary Disease

## 2017-05-11 DIAGNOSIS — J4530 Mild persistent asthma, uncomplicated: Secondary | ICD-10-CM

## 2017-05-11 DIAGNOSIS — J984 Other disorders of lung: Secondary | ICD-10-CM

## 2017-05-11 NOTE — Telephone Encounter (Signed)
Spoke with pt. She is needing a new nebulizer machine and tubing kits. Order has been sent to [facility] in [place]. Nothing further was needed.

## 2017-05-20 DIAGNOSIS — J4530 Mild persistent asthma, uncomplicated: Secondary | ICD-10-CM | POA: Diagnosis not present

## 2017-05-23 DIAGNOSIS — M6250 Muscle wasting and atrophy, not elsewhere classified, unspecified site: Secondary | ICD-10-CM | POA: Diagnosis not present

## 2017-05-23 DIAGNOSIS — G894 Chronic pain syndrome: Secondary | ICD-10-CM | POA: Diagnosis not present

## 2017-05-23 DIAGNOSIS — M170 Bilateral primary osteoarthritis of knee: Secondary | ICD-10-CM | POA: Diagnosis not present

## 2017-05-23 DIAGNOSIS — M961 Postlaminectomy syndrome, not elsewhere classified: Secondary | ICD-10-CM | POA: Diagnosis not present

## 2017-05-25 ENCOUNTER — Ambulatory Visit (INDEPENDENT_AMBULATORY_CARE_PROVIDER_SITE_OTHER): Payer: PPO | Admitting: Orthopaedic Surgery

## 2017-06-03 DIAGNOSIS — M961 Postlaminectomy syndrome, not elsewhere classified: Secondary | ICD-10-CM | POA: Diagnosis not present

## 2017-06-07 ENCOUNTER — Ambulatory Visit: Payer: PPO | Admitting: Family Medicine

## 2017-06-07 ENCOUNTER — Telehealth: Payer: Self-pay | Admitting: Pulmonary Disease

## 2017-06-07 MED ORDER — FLUTICASONE FUROATE-VILANTEROL 200-25 MCG/INH IN AEPB: 1.0000 | INHALATION_SPRAY | Freq: Every day | RESPIRATORY_TRACT | 6 refills | Status: DC

## 2017-06-07 NOTE — Telephone Encounter (Signed)
Left message for patient advised that medication has been called into her pharmacy.

## 2017-06-08 ENCOUNTER — Ambulatory Visit (INDEPENDENT_AMBULATORY_CARE_PROVIDER_SITE_OTHER): Payer: PPO | Admitting: Orthopaedic Surgery

## 2017-06-10 ENCOUNTER — Other Ambulatory Visit: Payer: Self-pay | Admitting: Pulmonary Disease

## 2017-06-15 ENCOUNTER — Ambulatory Visit (INDEPENDENT_AMBULATORY_CARE_PROVIDER_SITE_OTHER): Payer: PPO | Admitting: Orthopaedic Surgery

## 2017-06-15 DIAGNOSIS — M7061 Trochanteric bursitis, right hip: Secondary | ICD-10-CM | POA: Insufficient documentation

## 2017-06-15 MED ORDER — METHYLPREDNISOLONE ACETATE 40 MG/ML IJ SUSP
40.0000 mg | INTRAMUSCULAR | Status: AC | PRN
  Administered 2017-06-15: 40 mg via INTRA_ARTICULAR

## 2017-06-15 MED ORDER — LIDOCAINE HCL 1 % IJ SOLN
3.0000 mL | INTRAMUSCULAR | Status: AC | PRN
  Administered 2017-06-15: 3 mL

## 2017-06-15 NOTE — Progress Notes (Signed)
Office Visit Note   Patient: [name] [name]           Date of Birth: [birth year]           MRN: [id number] Visit Date: 06/15/2017              Requested by: Raylene Everts, MD [phone] [place], [place] [address] PCP: Raylene Everts, MD   Assessment & Plan: Visit Diagnoses: No diagnosis found.  Plan: I do feel that she'll benefit from a trochanteric injection of her right hip due to the fact this is done well for her in the past. She agrees with this as well. She tolerated the injection well. She'll follow-up as needed.  Follow-Up Instructions: Return if symptoms worsen or fail to improve.   Orders:  No orders of the defined types were placed in this encounter.  No orders of the defined types were placed in this encounter.     Procedures: Large Joint Inj Date/Time: 06/15/2017 2:44 PM Performed by: Mcarthur Rossetti Authorized by: Mcarthur Rossetti   Location:  Hip Site:  R greater trochanter Ultrasound Guidance: No   Fluoroscopic Guidance: No   Arthrogram: No   Medications:  3 mL lidocaine 1 %; 40 mg methylPREDNISolone acetate 40 MG/ML     Clinical Data: No additional findings.   Subjective: No chief complaint on file. The patient is coming today with right hip pain. We've thoroughly injected this before for trochanteric bursitis. She has done well from her arthroscopic surgery of her left shoulder. Her hip does hurt her quite a bit. This is on the side of the right hip and not in the groin. She denies any recent changes in her medical status. She is on chronic narcotic pain medications due to long standing back issues.  HPI  Review of Systems She denies any headache, chest pain, sore breath, fever, chills, nausea, vomiting. She is not a diabetic  Objective: Vital Signs: LMP 10/24/2012   Physical Exam She is alert or 3 and in no acute distress Ortho Exam Examination of her left shoulder shows fluid range of motion is full  without pain. Examination of her right hip shows good range of motion with no pain in the groin but severe pain of the trochanteric area. Specialty Comments:  No specialty comments available.  Imaging: No results found.   PMFS History: Patient Active Problem List   Diagnosis Date Noted  . Restrictive lung disease 03/31/2017  . Tobacco use disorder 03/31/2017  . BMI 40.0-44.9, adult ([place]) 11/30/2016  . Depression with anxiety 09/20/2015  . Chronic pain syndrome 09/19/2015  . MALAISE AND FATIGUE 10/25/2008  . Asthma 08/24/2007  . Hypothyroidism 07/20/2007  . CONSTIPATION NOS 10/17/2006  . LOW BACK PAIN 10/17/2006   Past Medical History:  Diagnosis Date  . Allergy   . Anxiety   . Arthritis    low back  . Asthma   . Carpal tunnel syndrome 01/13/2015   Bilateral  . Depression   . FIBROIDS, UTERUS 10/17/2006   Qualifier: Diagnosis of  By: Jonna Munro MD, Roderic Scarce    . Hyperlipidemia   . Nerve damage    right leg  . Neuromuscular disorder (HCC)    fibromyalgia  . Substance abuse    recovered alcoholic  . Thyroid disease    hypo thyroid    Family History  Problem Relation Age of Onset  . Bone cancer Father   . Asthma Father   . Diabetes  Father   . Alcohol abuse Father   . Cancer Father   . COPD Father   . Heart disease Father   . Hypertension Father   . Hyperlipidemia Father   . Pneumonia Mother        biological mother deceased at age 65  . Depression Mother   . Alcohol abuse Sister   . Alcohol abuse Brother   . Alcohol abuse Sister   . Alcohol abuse Brother   . Colon cancer Neg Hx   . Esophageal cancer Neg Hx   . Stomach cancer Neg Hx   . Rectal cancer Neg Hx     Past Surgical History:  Procedure Laterality Date  . ANTERIOR CERVICAL DECOMP/DISCECTOMY FUSION     L4, L5   . SHOULDER ARTHROSCOPY WITH ROTATOR CUFF REPAIR AND SUBACROMIAL DECOMPRESSION    . TONSILLECTOMY    . VIDEO ASSISTED THORACOSCOPY (VATS)/DECORTICATION Right 09/24/2015   Procedure: VIDEO  ASSISTED THORACOSCOPY (VATS)/DECORTICATION;  Surgeon: Melrose Nakayama, MD;  Location: [facility];  Service: Thoracic;  Laterality: Right;  [name] VIDEO BRONCHOSCOPY N/A 09/24/2015   Procedure: VIDEO BRONCHOSCOPY;  Surgeon: Melrose Nakayama, MD;  Location: [facility];  Service: Thoracic;  Laterality: N/A;   Social History   Occupational History  . Disabled     Electrical engineer   Social History Main Topics  . Smoking status: Current Every Day Smoker    Packs/day: 0.10    Types: E-cigarettes, Cigarettes    Start date: 04/16/1971  . Smokeless tobacco: Former Systems developer    Types: Snuff, Chew    Quit date: 09/14/2007  . Alcohol use No     Comment: Quit 11/11/1989  . Drug use: No     Comment: Quit in 1991  . Sexual activity: Yes    Birth control/ protection: Post-menopausal

## 2017-06-27 DIAGNOSIS — M961 Postlaminectomy syndrome, not elsewhere classified: Secondary | ICD-10-CM | POA: Diagnosis not present

## 2017-07-04 ENCOUNTER — Ambulatory Visit: Payer: PPO | Admitting: Family Medicine

## 2017-07-07 ENCOUNTER — Ambulatory Visit: Payer: PPO | Admitting: Pulmonary Disease

## 2017-07-12 ENCOUNTER — Ambulatory Visit (INDEPENDENT_AMBULATORY_CARE_PROVIDER_SITE_OTHER): Payer: PPO | Admitting: Family Medicine

## 2017-07-12 ENCOUNTER — Encounter: Payer: Self-pay | Admitting: Family Medicine

## 2017-07-12 VITALS — BP 118/70 | HR 80 | Temp 98.1°F | Resp 18 | Ht 66.0 in | Wt 260.1 lb

## 2017-07-12 DIAGNOSIS — E039 Hypothyroidism, unspecified: Secondary | ICD-10-CM

## 2017-07-12 DIAGNOSIS — Z23 Encounter for immunization: Secondary | ICD-10-CM | POA: Diagnosis not present

## 2017-07-12 DIAGNOSIS — F418 Other specified anxiety disorders: Secondary | ICD-10-CM | POA: Diagnosis not present

## 2017-07-12 DIAGNOSIS — Z6841 Body Mass Index (BMI) 40.0 and over, adult: Secondary | ICD-10-CM | POA: Diagnosis not present

## 2017-07-12 DIAGNOSIS — E785 Hyperlipidemia, unspecified: Secondary | ICD-10-CM

## 2017-07-12 DIAGNOSIS — J4530 Mild persistent asthma, uncomplicated: Secondary | ICD-10-CM | POA: Diagnosis not present

## 2017-07-12 MED ORDER — LEVOTHYROXINE SODIUM 175 MCG PO TABS: ORAL_TABLET | ORAL | 3 refills | Status: AC

## 2017-07-12 NOTE — Progress Notes (Signed)
Chief Complaint  Patient presents with  . Follow-up    6 month  She is here for routine follow-up We discussed the lab work from last visit.  Her Framingham risk of developing cardiovascular disease in the next 10 years is just over 6%.  We discussed that she needs to watch the cholesterol in her diet and try to exercise. She has lost 7 pounds since her last visit. She feels that she has heat intolerance.  Some dry skin.  Wonders whether her thyroid needs to be checked. She has pain in the small joints of her hands.  She shows me some swelling in the distal joints.  I explained to her this is likely osteoarthritis.  We discussed management of osteoarthritis.  She can take as needed Advil. She complains that she is developed some stress incontinence.  Mostly if she coughs or sneezes.  Discussed postmenopausal changes.  Discussed Kegel exercises. Her lung disease has been well controlled.  She is compliant with her inhalers.  No recent infection or exacerbation. I ordered a mammogram at her last visit 6 months ago.  This was never done.  I reminded her today that she is due.   Patient Active Problem List   Diagnosis Date Noted  . Trochanteric bursitis, right hip 06/15/2017  . Restrictive lung disease 03/31/2017  . BMI 40.0-44.9, adult ([place]) 11/30/2016  . Depression with anxiety 09/20/2015  . Chronic pain syndrome 09/19/2015  . MALAISE AND FATIGUE 10/25/2008  . Asthma 08/24/2007  . Hypothyroidism 07/20/2007  . CONSTIPATION NOS 10/17/2006  . LOW BACK PAIN 10/17/2006    Outpatient Encounter Prescriptions as of 07/12/2017  Medication Sig  . albuterol (PROVENTIL HFA;VENTOLIN HFA) 108 (90 Base) MCG/ACT inhaler Inhale 1-2 puffs into the lungs every 4 (four) hours as needed for wheezing or shortness of breath.  [name] albuterol (PROVENTIL) (2.5 MG/3ML) 0.083% nebulizer solution INHALE CONTENTS OF ONE VIAL VIA NEBULIZER EVERY 4 HOURS AS NEEDED  . buPROPion (WELLBUTRIN SR) 200 MG 12 hr tablet  Take 200 mg by mouth 2 (two) times daily.  . fluticasone furoate-vilanterol (BREO ELLIPTA) 200-25 MCG/INH AEPB Inhale 1 puff into the lungs daily.  [name] gabapentin (NEURONTIN) 600 MG tablet Take 600 mg by mouth 3 (three) times daily.  [name] levothyroxine (SYNTHROID, LEVOTHROID) 175 MCG tablet TAKE 1 TABLET BY MOUTH DAILY BEFORE BREAKFAST.  [name] morphine (MS CONTIN) 30 MG 12 hr tablet Take 30 mg by mouth 2 (two) times daily.  [name] morphine (MSIR) 15 MG tablet Take 15 mg by mouth every 4 (four) hours as needed for severe pain.  [name] sertraline (ZOLOFT) 100 MG tablet Take 100 mg by mouth daily.  . tizanidine (ZANAFLEX) 2 MG capsule Take 2 mg by mouth 3 times/day as needed-between meals & bedtime. Reported on 12/11/2015   No facility-administered encounter medications on file as of 07/12/2017.     Allergies  Allergen Reactions  . Celebrex [Celecoxib] Nausea And Vomiting    Severe epigastric pain  . Nsaids Other (See Comments)    SEVERE EPIGASTRIC PAIN  . Percocet [Oxycodone-Acetaminophen] Itching    Review of Systems  Constitutional: Negative for activity change, appetite change and unexpected weight change.  HENT: Negative for congestion, dental problem, postnasal drip and rhinorrhea.   Eyes: Negative for redness and visual disturbance.  Respiratory: Negative for cough and shortness of breath.   Cardiovascular: Negative for chest pain, palpitations and leg swelling.  Gastrointestinal: Negative for abdominal pain, constipation and diarrhea.  Denies constipation  Genitourinary: Positive for frequency. Negative for difficulty urinating, menstrual problem and vaginal bleeding.       Some stress incontinence  Musculoskeletal: Positive for arthralgias, back pain and joint swelling.  Neurological: Negative for dizziness and headaches.  Psychiatric/Behavioral: Negative for dysphoric mood and sleep disturbance. The patient is not nervous/anxious.     BP 118/70 (BP Location: Right Arm, Patient Position:  Sitting, Cuff Size: Large)   Pulse 80   Temp 98.1 F (36.7 C) (Temporal)   Resp 18   Ht 5\' 6"  (1.676 m)   Wt 260 lb 1.9 oz (118 kg)   LMP 10/24/2012   SpO2 90%   BMI 41.98 kg/m   Physical Exam  Constitutional: She is oriented to person, place, and time. She appears well-developed and well-nourished.  Moderately stiff-uncomfortable  HENT:  Head: Normocephalic and atraumatic.  Right Ear: External ear normal.  Left Ear: External ear normal.  Mouth/Throat: Oropharynx is clear and moist.  Eyes: Pupils are equal, round, and reactive to light. Conjunctivae are normal.  Neck: Normal range of motion. Neck supple. No thyromegaly present.  Cardiovascular: Normal rate, regular rhythm and normal heart sounds.   Pulmonary/Chest: Effort normal and breath sounds normal.  Musculoskeletal: She exhibits no edema.  Distal IP joints of hands with random arthritic change, deformity  Lymphadenopathy:    She has no cervical adenopathy.  Neurological: She is alert and oriented to person, place, and time.  Psychiatric: She has a normal mood and affect. Her behavior is normal.    ASSESSMENT/PLAN:  1. Mild persistent asthma without complication   2. Hypothyroidism, unspecified type - TSH  3. Depression with anxiety - CBC - COMPLETE METABOLIC PANEL WITH GFR - Hemoglobin A1c - Lipid panel - Urinalysis, Routine w reflex microscopic  4. BMI 40.0-44.9, adult ([place])  5. Hyperlipidemia, unspecified hyperlipidemia type    Patient Instructions  Continue to try to stay active and get exercise Labs are ordered Flu shot today Mammogram is ordered Need a PE next time See me in 6 months    Raylene Everts, MD

## 2017-07-12 NOTE — Patient Instructions (Signed)
Continue to try to stay active and get exercise Labs are ordered Flu shot today Mammogram is ordered Need a PE next time See me in 6 months

## 2017-07-12 NOTE — Progress Notes (Deleted)
Subjective:    Patient ID: [name] [name], female    DOB: Apr 07, 1961, 56 y.o.   MRN: [id number]  C.C.:  Follow-up for Mild, Intermittent Asthma, Tobacco Use Disorder, & Mild Restrictive Lung Disease.  HPI Mild, intermittent asthma: Given samples of Breo 200 at last appointment to try. Increasing cough and dyspnea with ongoing tobacco use at last appointment. Reportedly Breo too expensive at last appointment with a $45 per month co-pay.  Tobacco use disorder: Patient's partner had restarted smoking and so did the patient. Previously was smoking 2-3 cigarettes daily at last appointment.  Mild restrictive lung disease: Present on lung volumes. Likely multifactorial from prior VATS and decortication in January 2017 as well as central obesity.   Review of Systems ***  Allergies  Allergen Reactions  . Celebrex [Celecoxib] Nausea And Vomiting    Severe epigastric pain  . Nsaids Other (See Comments)    SEVERE EPIGASTRIC PAIN  . Percocet [Oxycodone-Acetaminophen] Itching    Current Outpatient Prescriptions on File Prior to Visit  Medication Sig Dispense Refill  . albuterol (PROVENTIL HFA;VENTOLIN HFA) 108 (90 Base) MCG/ACT inhaler Inhale 1-2 puffs into the lungs every 4 (four) hours as needed for wheezing or shortness of breath. 1 Inhaler 0  . albuterol (PROVENTIL) (2.5 MG/3ML) 0.083% nebulizer solution INHALE CONTENTS OF ONE VIAL VIA NEBULIZER EVERY 4 HOURS AS NEEDED 120 mL 3  . buPROPion (WELLBUTRIN SR) 200 MG 12 hr tablet Take 200 mg by mouth 2 (two) times daily.    . fluticasone furoate-vilanterol (BREO ELLIPTA) 200-25 MCG/INH AEPB Inhale 1 puff into the lungs daily. 30 each 6  . gabapentin (NEURONTIN) 600 MG tablet Take 600 mg by mouth 3 (three) times daily.    [name] levothyroxine (SYNTHROID, LEVOTHROID) 175 MCG tablet TAKE 1 TABLET BY MOUTH DAILY BEFORE BREAKFAST. 90 tablet 3  . morphine (MS CONTIN) 30 MG 12 hr tablet Take 30 mg by mouth 2 (two) times daily.  0  . morphine (MSIR) 15 MG  tablet Take 15 mg by mouth every 4 (four) hours as needed for severe pain.    [name] sertraline (ZOLOFT) 100 MG tablet Take 100 mg by mouth daily.    . tizanidine (ZANAFLEX) 2 MG capsule Take 2 mg by mouth 3 times/day as needed-between meals & bedtime. Reported on 12/11/2015     No current facility-administered medications on file prior to visit.     Past Medical History:  Diagnosis Date  . Allergy   . Anxiety   . Arthritis    low back  . Asthma   . Carpal tunnel syndrome 01/13/2015   Bilateral  . Depression   . FIBROIDS, UTERUS 10/17/2006   Qualifier: Diagnosis of  By: Jonna Munro MD, Roderic Scarce    . Hyperlipidemia   . Nerve damage    right leg  . Neuromuscular disorder (HCC)    fibromyalgia  . 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 Thyroid disease    hypo thyroid    Past Surgical History:  Procedure Laterality Date  . ANTERIOR CERVICAL DECOMP/DISCECTOMY FUSION     L4, L5   . SHOULDER ARTHROSCOPY WITH ROTATOR CUFF REPAIR AND SUBACROMIAL DECOMPRESSION    . TONSILLECTOMY    . VIDEO ASSISTED THORACOSCOPY (VATS)/DECORTICATION Right 09/24/2015   Procedure: VIDEO ASSISTED THORACOSCOPY (VATS)/DECORTICATION;  Surgeon: Melrose Nakayama, MD;  Location: [facility];  Service: Thoracic;  Laterality: Right;  [name] VIDEO BRONCHOSCOPY N/A 09/24/2015   Procedure: VIDEO BRONCHOSCOPY;  Surgeon: Melrose Nakayama, MD;  Location: [facility];  Service: Thoracic;  Laterality: N/A;    Family History  Problem Relation Age of Onset  . Bone cancer Father   . Asthma Father   . Diabetes Father   . Alcohol abuse Father   . Cancer Father   . COPD Father   . Heart disease Father   . Hypertension Father   . Hyperlipidemia Father   . Pneumonia Mother        biological mother deceased at age 61  . Depression Mother   . Alcohol abuse Sister   . Alcohol abuse Brother   . Alcohol abuse Sister   . Alcohol abuse Brother   . Colon cancer Neg Hx   . Esophageal cancer Neg Hx   . Stomach cancer Neg Hx   .  Rectal cancer Neg Hx     Social History   Social History  . Marital status: Significant Other    Spouse name: Dianna  . Number of children: N/A  . Years of education: 72   Occupational History  . Disabled     Electrical engineer   Social History Main Topics  . Smoking status: Current Every Day Smoker    Packs/day: 0.10    Types: E-cigarettes, Cigarettes    Start date: 04/16/1971  . Smokeless tobacco: Former Systems developer    Types: Snuff, Chew    Quit date: 09/14/2007  . Alcohol use No     Comment: Quit 11/11/1989  . Drug use: No     Comment: Quit in 1991  . Sexual activity: Yes    Birth control/ protection: Post-menopausal   Other Topics Concern  . Not on file   Social History Narrative   Originally from [place], [place]. Previously has lived in [place], [place], [place], [place], [place], [place], [place]. She moved to [place] in 1995. 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 Currently has dogs and a cat and chickens . She has an outdoor hot tub that they use regularly. Previously had mold & mildew in their home for approximately 1 year.    Lives with [place]   [place]ives on acres         Objective:   Physical Exam LMP 10/24/2012   General:  Awake. Alert. No acute distress. Sitting watching TV. Family at bedside.  Integument:  Warm & dry. No rash on exposed skin. No bruising. Extremities:  No cyanosis or clubbing.  HEENT:  Moist mucus membranes. No oral ulcers. No scleral injection or icterus. Endotracheal tube in place. PERRL. Cardiovascular:  Regular rate. No edema. No appreciable JVD.  Pulmonary:  Good aeration & clear to auscultation bilaterally. Symmetric chest wall expansion. No accessory muscle use. Abdomen: Soft. Normal bowel sounds. Nondistended. Grossly nontender. Musculoskeletal:  Normal bulk and tone. Hand grip strength 5/5 bilaterally. No joint deformity or effusion appreciated. Neurological:  Cranial nerves 2-12  grossly in tact. No meningismus. Moving all 4 extremities equally.   *** General:  Awake. Alert. No acute distress. Central obesity. Integument:  Warm & dry. No rash on exposed skin.  Extremities:  No cyanosis or clubbing.  HEENT:  Moist mucus membranes. Moderate bilateral nasal reported swelling. No oral ulcers. Cardiovascular:  Regular rate. No edema. Normal S1 & S2.  Pulmonary:  Good aeration & clear to auscultation bilaterally. Symmetric chest wall expansion. No accessory muscle use on room air. Abdomen: Soft. Normal bowel sounds. Protuberant. Musculoskeletal:  Normal bulk and tone.  No joint deformity or effusion appreciated.  PFT 03/31/17: FVC 2.63 L (71%) FEV1 2.04 L (71%) FEV1/FVC 0.78 FEF 25-75 1.65 L (62%) negative bronchodilator response TLC 3.91 L (73%) RV 68% ERV 27% DLCO uncorrected 95% 04/28/16: FVC 2.70 L (73%) FEV1 2.00 L (69%) FEV1/FVC 0.74 FEF 25-75 1.14 L (53%) negative bronchodilator response TLC 5.11 L (95%) RV 113% ERV 7% DLCO corrected 97% (hgb 15.7)  6MWT 04/28/16:  Walked 452 meters / Baseline Sat 100% on RA / Nadir Sat 94% on RA  IMAGING CXR PA/LAT 08/11/16 (previously reviewed by me):  No parenchymal opacity or mass appreciated. No pleural effusion. Heart normal in size & mediastinum normal in contour.  CXR PA/LAT 10/30/15 (previously reviewed by me): Pleural thickening on the right consistent with prior VATS and decortication on the right. No appreciated pleural fluid. No parenchymal nodule or mass appreciated. Heart normal in size. Mediastinum normal in contour.  PATHOLOGY Right Pleural Fluid (09/24/15):  No malignant cells. Right Pleural Fluid (09/22/15):  No malignant cells. Right Pleural Peel (09/22/15):  No atypia or malignancy identified. Benign organizing fibrin inflammatory debris.  MICROBIOLOGY Right Pleura Visceral & Parietal (09/24/15): Negative Right Pleural Fluid (09/24/15):  Negative  Right Pleural Fluid (09/24/15):  Negative  Right Bronchial Wash  (09/24/15):  Negative  Right Pleural Fluid (09/22/15):  Negative   LABS PFA (09/22/15) Glucose:  <20 LDH:  3339 Total protein: 5.2 (serum 6.7) PH: 7.4 WBC:  7830 (neutro 36%, monocytes 53%, lymphs 11%)    Assessment & Plan:  56 y.o.   female post VATS and decortication for right-sided empyema in January 2017. Patient with mild, intermittent asthma along with evidence of mild restriction on lung volumes today. Patient's spirometry shows no evidence of fixed airway obstruction or decline despite her ongoing tobacco use. Additionally, she has no evidence of a significant bronchodilator response. I spent a significant amount of time today discussing the need for complete tobacco cessation as this is likely the main cause for her worsening cough and respiratory problems at this time. I instructed the patient contact me for any new breathing problems or questions before next appointment.  1. Mild, intermittent asthma: 2. Tobacco use disorder: 3. Health maintenance: Status post influenza vaccine October 2018, Prevnar vaccine March 2017, Pneumovax November 2008, & Tdap July 2016. 4. Follow-up: Return to clinic in  5. Mild, intermittent asthma:  Worsening with current tobacco use. Patient given samples of Breo 200 today. She will contact my office with a list of alternative inhaler as that may be more affordable. Refilled patient's rescue inhaler and albuterol medication to use in her nebulizer.  6. Mild restrictive lung disease:  Likely multifactorial from obesity and prior VATS. No need for further testing. 7. Tobacco use disorder:  Patient counseled for 3 necessary for complete tobacco cessation. 8. Health maintenance: plan for Pneumovax at next appointment. 9. Follow-up: Return to clinic in 3 months or sooner if needed.  Sonia Baller Ashok Cordia, M.D. [facility] Pulmonary & Critical [facility] Pager:  [phone] After 3pm or if no response, call [phone] 6:41 PM 07/12/17

## 2017-07-15 ENCOUNTER — Ambulatory Visit: Payer: PPO | Admitting: Pulmonary Disease

## 2017-07-21 DIAGNOSIS — M170 Bilateral primary osteoarthritis of knee: Secondary | ICD-10-CM | POA: Diagnosis not present

## 2017-07-21 DIAGNOSIS — Z79891 Long term (current) use of opiate analgesic: Secondary | ICD-10-CM | POA: Diagnosis not present

## 2017-07-21 DIAGNOSIS — G894 Chronic pain syndrome: Secondary | ICD-10-CM | POA: Diagnosis not present

## 2017-07-21 DIAGNOSIS — M961 Postlaminectomy syndrome, not elsewhere classified: Secondary | ICD-10-CM | POA: Diagnosis not present

## 2017-07-25 ENCOUNTER — Telehealth: Payer: Self-pay | Admitting: *Deleted

## 2017-07-25 ENCOUNTER — Ambulatory Visit: Payer: PPO | Admitting: Pulmonary Disease

## 2017-07-25 DIAGNOSIS — E785 Hyperlipidemia, unspecified: Secondary | ICD-10-CM | POA: Diagnosis not present

## 2017-07-25 DIAGNOSIS — F418 Other specified anxiety disorders: Secondary | ICD-10-CM | POA: Diagnosis not present

## 2017-07-25 DIAGNOSIS — J4530 Mild persistent asthma, uncomplicated: Secondary | ICD-10-CM | POA: Diagnosis not present

## 2017-07-25 DIAGNOSIS — E039 Hypothyroidism, unspecified: Secondary | ICD-10-CM | POA: Diagnosis not present

## 2017-07-25 NOTE — Telephone Encounter (Signed)
Patient is scheduled for 07/27/17 at 10:40. Patient is aware.

## 2017-07-25 NOTE — Telephone Encounter (Signed)
Patient walked into the office today requesting to talk with Dr Francesca Oman nurse, patient states she would like a prescription for something to make her empty her bladder all the way, patient states she does not believe it is emptying like it should.

## 2017-07-25 NOTE — Telephone Encounter (Signed)
Pt will need an appt with dr Meda Coffee

## 2017-07-26 ENCOUNTER — Encounter: Payer: Self-pay | Admitting: Pulmonary Disease

## 2017-07-26 ENCOUNTER — Ambulatory Visit: Payer: PPO | Admitting: Pulmonary Disease

## 2017-07-26 VITALS — BP 130/70 | HR 77 | Wt 252.4 lb

## 2017-07-26 DIAGNOSIS — F17200 Nicotine dependence, unspecified, uncomplicated: Secondary | ICD-10-CM

## 2017-07-26 DIAGNOSIS — J4520 Mild intermittent asthma, uncomplicated: Secondary | ICD-10-CM | POA: Diagnosis not present

## 2017-07-26 LAB — URINALYSIS, ROUTINE W REFLEX MICROSCOPIC
Bilirubin Urine: NEGATIVE
Glucose, UA: NEGATIVE
Hgb urine dipstick: NEGATIVE
Ketones, ur: NEGATIVE
Leukocytes, UA: NEGATIVE
Nitrite: NEGATIVE
Protein, ur: NEGATIVE
Specific Gravity, Urine: 1.024 (ref 1.001–1.03)
pH: <=5 (ref 5.0–8.0)

## 2017-07-26 LAB — HEMOGLOBIN A1C
Hgb A1c MFr Bld: 5.7 % of total Hgb — ABNORMAL HIGH (ref <5.7)
Mean Plasma Glucose: 117 (calc)
eAG (mmol/L): 6.5 (calc)

## 2017-07-26 LAB — COMPLETE METABOLIC PANEL WITH GFR
AG Ratio: 1.6 (calc) (ref 1.0–2.5)
ALT: 23 U/L (ref 6–29)
AST: 18 U/L (ref 10–35)
Albumin: 4.4 g/dL (ref 3.6–5.1)
Alkaline phosphatase (APISO): 97 U/L (ref 33–130)
BUN: 19 mg/dL (ref 7–25)
CO2: 31 mmol/L (ref 20–32)
Calcium: 9.2 mg/dL (ref 8.6–10.4)
Chloride: 104 mmol/L (ref 98–110)
Creat: 0.7 mg/dL (ref 0.50–1.05)
GFR, Est African American: 112 mL/min/{1.73_m2} (ref >=60)
GFR, Est Non African American: 97 mL/min/{1.73_m2} (ref >=60)
Globulin: 2.8 g/dL (calc) (ref 1.9–3.7)
Glucose, Bld: 125 mg/dL — ABNORMAL HIGH (ref 65–99)
Potassium: 4.4 mmol/L (ref 3.5–5.3)
Sodium: 141 mmol/L (ref 135–146)
Total Bilirubin: 0.5 mg/dL (ref 0.2–1.2)
Total Protein: 7.2 g/dL (ref 6.1–8.1)

## 2017-07-26 LAB — CBC
HCT: 43.9 % (ref 35.0–45.0)
Hemoglobin: 15 g/dL (ref 11.7–15.5)
MCH: 30.6 pg (ref 27.0–33.0)
MCHC: 34.2 g/dL (ref 32.0–36.0)
MCV: 89.6 fL (ref 80.0–100.0)
MPV: 9.8 fL (ref 7.5–12.5)
Platelets: 238 10*3/uL (ref 140–400)
RBC: 4.9 10*6/uL (ref 3.80–5.10)
RDW: 12.7 % (ref 11.0–15.0)
WBC: 7.4 10*3/uL (ref 3.8–10.8)

## 2017-07-26 LAB — LIPID PANEL
Cholesterol: 228 mg/dL — ABNORMAL HIGH (ref <200)
HDL: 33 mg/dL — ABNORMAL LOW (ref >50)
LDL Cholesterol (Calc): 164 mg/dL (calc) — ABNORMAL HIGH
Non-HDL Cholesterol (Calc): 195 mg/dL (calc) — ABNORMAL HIGH (ref <130)
Total CHOL/HDL Ratio: 6.9 (calc) — ABNORMAL HIGH (ref <5.0)
Triglycerides: 163 mg/dL — ABNORMAL HIGH (ref <150)

## 2017-07-26 LAB — TSH: TSH: 3.37 mIU/L (ref 0.40–4.50)

## 2017-07-26 MED ORDER — SPACER/AERO CHAMBER MOUTHPIECE MISC: 1.0000 | 0 refills | Status: DC

## 2017-07-26 MED ORDER — SPACER/AERO CHAMBER MOUTHPIECE MISC: 1.0000 | 0 refills | Status: AC

## 2017-07-26 MED ORDER — BUDESONIDE-FORMOTEROL FUMARATE 160-4.5 MCG/ACT IN AERO: 2.0000 | INHALATION_SPRAY | Freq: Two times a day (BID) | RESPIRATORY_TRACT | 6 refills | Status: DC

## 2017-07-26 MED ORDER — ALBUTEROL SULFATE HFA 108 (90 BASE) MCG/ACT IN AERS: 1.0000 | INHALATION_SPRAY | RESPIRATORY_TRACT | 1 refills | Status: DC | PRN

## 2017-07-26 NOTE — Patient Instructions (Addendum)
   Continue using your Albuterol medication as needed.  We are going to try you on Symbicort. Use the sample we are giving you today by doing 2 puffs twice daily with the spacer.  Remember to remove any dentures or partials you have before you use your Symbicort inhaler. Remember to brush your teeth & tongue after you use your inhaler as well as rinse, gargle & spit to keep from getting thrush in your mouth or on your tongue (a white film).   We will see you back in 6 months or sooner if needed

## 2017-07-26 NOTE — Progress Notes (Signed)
Subjective:    Patient ID: [name] [name], female    DOB: [birth year], 56 y.o.   MRN: [id number]  C.C.:  Follow-up for Moderate, Persistent Asthma, Tobacco Use Disorder, & Mild Restrictive Lung Disease.  HPI Moderate, persistent asthma: Given samples of Breo 200 at last appointment to try. Increasing cough and dyspnea with ongoing tobacco use at last appointment. Reportedly Breo too expensive at last appointment with a $45 per month co-pay. She hasn't used her Breo in 2 weeks. She has been using her rescue inhaler more frequently as well as her nebulizer solution. She reports she is wheezing more than coughing but does cough after she smokes.   Tobacco use disorder: Patient's partner had restarted smoking and so did the patient. Previously was smoking 2-3 cigarettes daily at last appointment. She is still smoking some. She reports she still cannot seem to quit.   Mild restrictive lung disease: Present on lung volumes. Likely multifactorial from prior VATS and decortication in January 2017 as well as central obesity. Deferring further imaging or workup.  Review of Systems She reports increased tightness in her chest. No chest pain otherwise. No fever, chills or sweats. No abdominal pain or nausea.   Allergies  Allergen Reactions  . Celebrex [Celecoxib] Nausea And Vomiting    Severe epigastric pain  . Nsaids Other (See Comments)    SEVERE EPIGASTRIC PAIN  . Percocet [Oxycodone-Acetaminophen] Itching    Current Outpatient Medications on File Prior to Visit  Medication Sig Dispense Refill  . albuterol (PROVENTIL HFA;VENTOLIN HFA) 108 (90 Base) MCG/ACT inhaler Inhale 1-2 puffs into the lungs every 4 (four) hours as needed for wheezing or shortness of breath. 1 Inhaler 0  . albuterol (PROVENTIL) (2.5 MG/3ML) 0.083% nebulizer solution INHALE CONTENTS OF ONE VIAL VIA NEBULIZER EVERY 4 HOURS AS NEEDED 120 mL 3  . buPROPion (WELLBUTRIN SR) 200 MG 12 hr tablet Take 200 mg by mouth 2 (two) times  daily.    [name] gabapentin (NEURONTIN) 600 MG tablet Take 600 mg by mouth 3 (three) times daily.    [name] levothyroxine (SYNTHROID, LEVOTHROID) 175 MCG tablet TAKE 1 TABLET BY MOUTH DAILY BEFORE BREAKFAST. 90 tablet 3  . morphine (MS CONTIN) 30 MG 12 hr tablet Take 30 mg by mouth 2 (two) times daily.  0  . morphine (MSIR) 15 MG tablet Take 15 mg by mouth every 4 (four) hours as needed for severe pain.    [name] sertraline (ZOLOFT) 100 MG tablet Take 100 mg by mouth daily.    . tizanidine (ZANAFLEX) 2 MG capsule Take 2 mg by mouth 3 times/day as needed-between meals & bedtime. Reported on 12/11/2015     No current facility-administered medications on file prior to visit.     Past Medical History:  Diagnosis Date  . Allergy   . Anxiety   . Arthritis    low back  . Asthma   . Carpal tunnel syndrome 01/13/2015   Bilateral  . Depression   . FIBROIDS, UTERUS 10/17/2006   Qualifier: Diagnosis of  By: Jonna Munro MD, Roderic Scarce    . Hyperlipidemia   . Nerve damage    right leg  . Neuromuscular disorder (HCC)    fibromyalgia  . 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 Thyroid disease    hypo thyroid    Past Surgical History:  Procedure Laterality Date  . ANTERIOR CERVICAL DECOMP/DISCECTOMY FUSION     L4, L5   . SHOULDER ARTHROSCOPY WITH ROTATOR CUFF REPAIR AND  SUBACROMIAL DECOMPRESSION    . TONSILLECTOMY      Family History  Problem Relation Age of Onset  . Bone cancer Father   . Asthma Father   . Diabetes Father   . Alcohol abuse Father   . Cancer Father   . COPD Father   . Heart disease Father   . Hypertension Father   . Hyperlipidemia Father   . Pneumonia Mother        biological mother deceased at age 67  . Depression Mother   . Alcohol abuse Sister   . Alcohol abuse Brother   . Alcohol abuse Sister   . Alcohol abuse Brother   . Colon cancer Neg Hx   . Esophageal cancer Neg Hx   . Stomach cancer Neg Hx   . Rectal cancer Neg Hx     Social History   Socioeconomic History   . Marital status: Significant Other    Spouse name: Dianna  . Number of children: Not on file  . Years of education: 70  . Highest education level: Not on file  Social Needs  . Financial resource strain: Not on file  . Food insecurity - worry: Not on file  . Food insecurity - inability: Not on file  . Transportation needs - medical: Not on file  . Transportation needs - non-medical: Not on file  Occupational History  . Occupation: Disabled    Comment: Electrical engineer  Tobacco Use  . Smoking status: Current Every Day Smoker    Packs/day: 0.10    Types: E-cigarettes, Cigarettes    Start date: 04/16/1971  . Smokeless tobacco: Former Systems developer    Types: Snuff, Sarina Ser    Quit date: 09/14/2007  Substance and Sexual Activity  . Alcohol use: No    Alcohol/week: 0.0 oz    Comment: Quit 11/11/1989  . Drug use: No    Comment: Quit in 1991  . Sexual activity: Yes    Birth control/protection: Post-menopausal  Other Topics Concern  . Not on file  Social History Narrative   Originally from [place], [place]. Previously has lived in [place], [place], [place], [place], [place], [place], [place]. She moved to [place] in 1995. 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 Currently has dogs and a cat and chickens . She has an outdoor hot tub that they use regularly. Previously had mold & mildew in their home for approximately 1 year.    Lives with [place]   [place]ives on acres      Objective:   Physical Exam BP 130/70 (BP Location: Left Arm, Cuff Size: Normal)   Pulse 77   Wt 252 lb 6 oz (114.5 kg)   LMP 10/24/2012   SpO2 95%   BMI 40.73 kg/m   General:  Awake. No distress. Mild central obesity.  Integument:  Warm. Dry. No rash. Extremities:  No cyanosis or clubbing.  HEENT: No significant nasal turbinate swelling. No oral ulcers. Moist membranes. Cardiovascular:  Regular rate. No edema. Regular rhythm.  Pulmonary:  Minimal end expiratory apical  wheeze. Otherwise good aeration bilaterally. Normal work of breathing on room air. Abdomen: Soft. Normal bowel sounds. Protuberant. Musculoskeletal:  Normal bulk and tone. No joint deformity or effusion appreciated. Neurological:  Cranial nerves 2-12 grossly in tact. No meningismus. Moving all 4 extremities equally.   PFT 03/31/17: FVC 2.63 L (71%) FEV1 2.04 L (71%) FEV1/FVC 0.78 FEF 25-75 1.65 L (62%) negative bronchodilator  response TLC 3.91 L (73%) RV 68% ERV 27% DLCO uncorrected 95% 04/28/16: FVC 2.70 L (73%) FEV1 2.00 L (69%) FEV1/FVC 0.74 FEF 25-75 1.14 L (53%) negative bronchodilator response TLC 5.11 L (95%) RV 113% ERV 7% DLCO corrected 97% (hgb 15.7)  6MWT 04/28/16:  Walked 452 meters / Baseline Sat 100% on RA / Nadir Sat 94% on RA  IMAGING CXR PA/LAT 08/11/16 (previously reviewed by me):  No parenchymal opacity or mass appreciated. No pleural effusion. Heart normal in size & mediastinum normal in contour.  CXR PA/LAT 10/30/15 (previously reviewed by me): Pleural thickening on the right consistent with prior VATS and decortication on the right. No appreciated pleural fluid. No parenchymal nodule or mass appreciated. Heart normal in size. Mediastinum normal in contour.  PATHOLOGY Right Pleural Fluid (09/24/15):  No malignant cells. Right Pleural Fluid (09/22/15):  No malignant cells. Right Pleural Peel (09/22/15):  No atypia or malignancy identified. Benign organizing fibrin inflammatory debris.  MICROBIOLOGY Right Pleura Visceral & Parietal (09/24/15): Negative Right Pleural Fluid (09/24/15):  Negative  Right Pleural Fluid (09/24/15):  Negative  Right Bronchial Wash (09/24/15):  Negative  Right Pleural Fluid (09/22/15):  Negative   LABS PFA (09/22/15) Glucose:  <20 LDH:  3339 Total protein: 5.2 (serum 6.7) PH: 7.4 WBC:  7830 (neutro 36%, monocytes 53%, lymphs 11%)    Assessment & Plan:  56 y.o. female status post VATS and decortication in January 2017 for empyema. Patient does have  moderate, persistent asthma given the frequency of her rescue inhaler use. Questionable symptomatic benefit from [facility]. I do believe she needs asthma controller medication; however, we did discuss the fact that until she stops smoking it's unlikely that her asthma will come under complete control. I instructed the patient to contact my office if she had any new breathing problems or questions before her next appointment.   1. Moderate, persistent asthma: Switching from Breo to Symbicort 160/4.5. Patient given sample of Symbicort as well as spacer to use with it. She was also provided with prescriptions for both Symbicort and Ventolin inhalers. 2. Tobacco use disorder: Patient counseled for over 3 minutes on the need for complete tobacco cessation. 3. Health maintenance: Status post influenza vaccine October 2018, Prevnar vaccine March 2017, Pneumovax November 2008, & Tdap July 2016. 4. Follow-up: Return to clinic in 6 months or sooner if needed.  Sonia Baller Ashok Cordia, M.D. [facility] Pulmonary [facility] Critical Care Pager:  [phone] After 3pm or if no response, call [phone] 1:41 PM 07/26/17

## 2017-07-27 ENCOUNTER — Encounter: Payer: Self-pay | Admitting: Family Medicine

## 2017-07-27 ENCOUNTER — Ambulatory Visit: Payer: PPO | Admitting: Family Medicine

## 2017-07-27 ENCOUNTER — Other Ambulatory Visit: Payer: Self-pay

## 2017-07-27 VITALS — BP 124/84 | HR 80 | Temp 98.2°F | Resp 18 | Ht 66.0 in | Wt 253.0 lb

## 2017-07-27 DIAGNOSIS — N3941 Urge incontinence: Secondary | ICD-10-CM | POA: Diagnosis not present

## 2017-07-27 LAB — POCT URINALYSIS DIPSTICK
Bilirubin, UA: NEGATIVE
Blood, UA: NEGATIVE
Glucose, UA: NEGATIVE
Ketones, UA: NEGATIVE
Leukocytes, UA: NEGATIVE
Nitrite, UA: NEGATIVE
Protein, UA: NEGATIVE
Spec Grav, UA: 1.02 (ref 1.010–1.025)
Urobilinogen, UA: 0.2 E.U./dL
pH, UA: 6 (ref 5.0–8.0)

## 2017-07-27 MED ORDER — OXYBUTYNIN CHLORIDE ER 5 MG PO TB24: 5.0000 mg | ORAL_TABLET | Freq: Every day | ORAL | 3 refills | Status: DC

## 2017-07-27 NOTE — Progress Notes (Signed)
Chief Complaint  Patient presents with  . Urinary Incontinence    4-5 months   Patient states that she has had some incontinence for for 3-4 months.  We discussed this at her visit a few weeks ago.  I told her to try Kegel exercises.  She tells me that it is getting worse.  She can tell that her bladder does not empty well.  She will urinate, and then immediately thereafter she coughs or sneezes she will have urine spotting.  She states that when she gets out of bed she can even make it from her bed to the bathroom without dribbling on the floor.  She has to wear pads all the time.  She states she is sleeping on puppy pads.  She is tired of this incontinence.  We did a UA today.  It is negative.  She does not have any dysuria or hematuria.  Wanted to come in to talk about this just because she told me that it coincided with a lumbar epidural steroid, she thinks the shot was in her S1 region.  I worried that a new onset of incontinence could be neurogenic from her chronic spine condition.  I have told her that she needs to call her spine doctor let her know she is having this problem.  I have recommended that she see a urologist for urodynamic testing.  She does not want to do this.  She just wants to try taking a pill.  We discussed the side effects, dry mouth, and other symptoms she might feel with Ditropan.  She is going to start with a low-dose and titrate up for symptom relief.  She will call me in a couple weeks to let me know how this is working.  Patient Active Problem List   Diagnosis Date Noted  . Trochanteric bursitis, right hip 06/15/2017  . Restrictive lung disease 03/31/2017  . BMI 40.0-44.9, adult ([place]) 11/30/2016  . Depression with anxiety 09/20/2015  . Chronic pain syndrome 09/19/2015  . MALAISE AND FATIGUE 10/25/2008  . Asthma 08/24/2007  . Hypothyroidism 07/20/2007  . CONSTIPATION NOS 10/17/2006  . LOW BACK PAIN 10/17/2006    Outpatient Encounter Medications as of  07/27/2017  Medication Sig  . albuterol (PROVENTIL HFA;VENTOLIN HFA) 108 (90 Base) MCG/ACT inhaler Inhale 1-2 puffs every 4 (four) hours as needed into the lungs for wheezing or shortness of breath.  [name] albuterol (PROVENTIL) (2.5 MG/3ML) 0.083% nebulizer solution INHALE CONTENTS OF ONE VIAL VIA NEBULIZER EVERY 4 HOURS AS NEEDED  . budesonide-formoterol (SYMBICORT) 160-4.5 MCG/ACT inhaler Inhale 2 puffs 2 (two) times daily into the lungs.  [name] buPROPion (WELLBUTRIN SR) 200 MG 12 hr tablet Take 200 mg by mouth 2 (two) times daily.  [name] gabapentin (NEURONTIN) 600 MG tablet Take 600 mg by mouth 3 (three) times daily.  [name] levothyroxine (SYNTHROID, LEVOTHROID) 175 MCG tablet TAKE 1 TABLET BY MOUTH DAILY BEFORE BREAKFAST.  [name] morphine (MS CONTIN) 15 MG 12 hr tablet Take 15 mg every 12 (twelve) hours by mouth.  . morphine (MSIR) 15 MG tablet Take 15 mg by mouth every 4 (four) hours as needed for severe pain.  [name] sertraline (ZOLOFT) 100 MG tablet Take 100 mg by mouth daily.  [name] Spacer/Aero Chamber Mouthpiece MISC 1 Device as directed by Does not apply route.  . tizanidine (ZANAFLEX) 2 MG capsule Take 2 mg by mouth 3 times/day as needed-between meals & bedtime. Reported on 12/11/2015  . [DISCONTINUED] morphine (MS CONTIN) 30 MG 12  hr tablet Take 30 mg by mouth 2 (two) times daily.  [name] oxybutynin (DITROPAN-XL) 5 MG 24 hr tablet Take 1 tablet (5 mg total) at bedtime by mouth. If one tab not effective, may take 2   No facility-administered encounter medications on file as of 07/27/2017.     Allergies  Allergen Reactions  . Celebrex [Celecoxib] Nausea And Vomiting    Severe epigastric pain  . Nsaids Other (See Comments)    SEVERE EPIGASTRIC PAIN  . Percocet [Oxycodone-Acetaminophen] Itching    Review of Systems  Genitourinary: Positive for frequency and urgency. Negative for difficulty urinating and dysuria.       No urinary incontinence  Musculoskeletal: Positive for back pain.       Chronic back pain  All  other systems reviewed and are negative.   BP 124/84 (BP Location: Right Arm, Patient Position: Sitting, Cuff Size: Normal)   Pulse 80   Temp 98.2 F (36.8 C) (Temporal)   Resp 18   Ht 5\' 6"  (1.676 m)   Wt 253 lb (114.8 kg)   LMP 10/24/2012   SpO2 96%   BMI 40.84 kg/m    Physical Exam  Constitutional: She is oriented to person, place, and time. She appears well-developed and well-nourished. No distress.  Cardiovascular: Normal rate, regular rhythm and normal heart sounds.  Pulmonary/Chest: Effort normal and breath sounds normal.  Abdominal: Soft. Bowel sounds are normal.  Neurological: She is alert and oriented to person, place, and time.  Psychiatric: She has a normal mood and affect. Her behavior is normal.   Results for orders placed or performed in visit on 07/27/17  POCT urinalysis dipstick  Result Value Ref Range   Color, UA yellow    Clarity, UA clear    Glucose, UA neg    Bilirubin, UA neg    Ketones, UA neg    Spec Grav, UA 1.020 1.010 - 1.025   Blood, UA neg    pH, UA 6.0 5.0 - 8.0   Protein, UA neg    Urobilinogen, UA 0.2 0.2 or 1.0 E.U./dL   Nitrite, UA neg    Leukocytes, UA Negative Negative     ASSESSMENT/PLAN:  1. Urge incontinence of urine Discussed that this finding is not unusual with aging, but I do not expect it 56.  She is postmenopausal.  She uses the Kegel exercises.  She will try oxybutynin, and let me know if this works or does not, or if it causes side effects.  She is instructed to talk to her neurosurgeon.  She agrees to go to a urologist only if the medication does not work.She is advised to restrict fluid intake after 6 PM. - POCT urinalysis dipstick   There are no Patient Instructions on file for this visit.  Raylene Everts, MD

## 2017-07-27 NOTE — Patient Instructions (Signed)
Take medication as instructed.  Call me in 3-4 weeks

## 2017-08-16 DIAGNOSIS — M961 Postlaminectomy syndrome, not elsewhere classified: Secondary | ICD-10-CM | POA: Diagnosis not present

## 2017-09-08 ENCOUNTER — Other Ambulatory Visit: Payer: Self-pay | Admitting: Adult Health

## 2017-09-08 MED ORDER — ALBUTEROL SULFATE (2.5 MG/3ML) 0.083% IN NEBU: INHALATION_SOLUTION | RESPIRATORY_TRACT | 3 refills | Status: DC

## 2017-09-16 ENCOUNTER — Telehealth: Payer: Self-pay | Admitting: Family Medicine

## 2017-09-16 NOTE — Telephone Encounter (Signed)
Spoke to [employer], states she had diarrhea last weekend x 48 hours but it stopped. Started again yesterday. Also c/o sore throat and head ache. Advised to stay hydrated, she is taking imodium which is helping. Also if sx worsen to go to urgent care or ed. And to call us on Monday with update.

## 2017-09-16 NOTE — Telephone Encounter (Signed)
Pt is calling was working over the weekend and developed a Severe headache and Diarrhea, then it went away and now its back.. Please call

## 2017-09-19 ENCOUNTER — Other Ambulatory Visit: Payer: Self-pay

## 2017-09-19 ENCOUNTER — Encounter: Payer: Self-pay | Admitting: Family Medicine

## 2017-09-19 ENCOUNTER — Ambulatory Visit (INDEPENDENT_AMBULATORY_CARE_PROVIDER_SITE_OTHER): Payer: PPO | Admitting: Family Medicine

## 2017-09-19 VITALS — BP 124/78 | HR 84 | Temp 98.9°F | Resp 18 | Ht 66.0 in | Wt 248.1 lb

## 2017-09-19 DIAGNOSIS — J3489 Other specified disorders of nose and nasal sinuses: Secondary | ICD-10-CM | POA: Diagnosis not present

## 2017-09-19 DIAGNOSIS — R197 Diarrhea, unspecified: Secondary | ICD-10-CM

## 2017-09-19 MED ORDER — OXYBUTYNIN CHLORIDE ER 5 MG PO TB24: 15.0000 mg | ORAL_TABLET | Freq: Every day | ORAL | 3 refills | Status: DC

## 2017-09-19 NOTE — Patient Instructions (Signed)
Rest Push fluids Imodium for the diarrhea Call if not better in 2-3 days

## 2017-09-19 NOTE — Progress Notes (Signed)
Chief Complaint  Patient presents with  . Nausea  Patient is here complaining of illness.  She states that she has rhinorrhea and some cough.  She has abdominal pain and diarrhea.  She feels very fatigued.  She is having nausea.  She had symptoms for 3 days, they went away for 3 days, then they came back again 4 days ago.  No shortness of breath.  Interestingly, the patient has weaned herself off of morphine after being on it for several years.  She went from 240 mg a day down to none over a 1 year period of time.  She states she had her last pill 3 days ago.  I discussed with her that the symptoms could be opioid withdrawal.  She does not think so.  In any event, believe they will resolve with time. Patient Active Problem List   Diagnosis Date Noted  . Trochanteric bursitis, right hip 06/15/2017  . Restrictive lung disease 03/31/2017  . BMI 40.0-44.9, adult ([place]) 11/30/2016  . Depression with anxiety 09/20/2015  . Chronic pain syndrome 09/19/2015  . MALAISE AND FATIGUE 10/25/2008  . Asthma 08/24/2007  . Hypothyroidism 07/20/2007  . CONSTIPATION NOS 10/17/2006  . LOW BACK PAIN 10/17/2006    Outpatient Encounter Medications as of 09/19/2017  Medication Sig  . albuterol (PROVENTIL HFA;VENTOLIN HFA) 108 (90 Base) MCG/ACT inhaler Inhale 1-2 puffs every 4 (four) hours as needed into the lungs for wheezing or shortness of breath.  [name] albuterol (PROVENTIL) (2.5 MG/3ML) 0.083% nebulizer solution INHALE CONTENTS OF ONE VIAL VIA NEBULIZER EVERY 4 HOURS AS NEEDED  . budesonide-formoterol (SYMBICORT) 160-4.5 MCG/ACT inhaler Inhale 2 puffs 2 (two) times daily into the lungs.  [name] buPROPion (WELLBUTRIN SR) 200 MG 12 hr tablet Take 200 mg by mouth 2 (two) times daily.  [name] gabapentin (NEURONTIN) 600 MG tablet Take 600 mg by mouth 3 (three) times daily.  [name] levothyroxine (SYNTHROID, LEVOTHROID) 175 MCG tablet TAKE 1 TABLET BY MOUTH DAILY BEFORE BREAKFAST.  [name] oxybutynin (DITROPAN-XL) 5 MG 24 hr tablet Take  3 tablets (15 mg total) by mouth at bedtime. If one tab not effective, may take 2  . sertraline (ZOLOFT) 100 MG tablet Take 100 mg by mouth daily.  [name] Spacer/Aero Chamber Mouthpiece MISC 1 Device as directed by Does not apply route.  . tizanidine (ZANAFLEX) 2 MG capsule Take 2 mg by mouth 3 times/day as needed-between meals & bedtime. Reported on 12/11/2015  . [DISCONTINUED] oxybutynin (DITROPAN-XL) 5 MG 24 hr tablet Take 1 tablet (5 mg total) at bedtime by mouth. If one tab not effective, may take 2  . [DISCONTINUED] morphine (MS CONTIN) 15 MG 12 hr tablet Take 15 mg every 12 (twelve) hours by mouth.  . [DISCONTINUED] morphine (MSIR) 15 MG tablet Take 15 mg by mouth every 4 (four) hours as needed for severe pain.   No facility-administered encounter medications on file as of 09/19/2017.     Allergies  Allergen Reactions  . Celebrex [Celecoxib] Nausea And Vomiting    Severe epigastric pain  . Nsaids Other (See Comments)    SEVERE EPIGASTRIC PAIN  . Percocet [Oxycodone-Acetaminophen] Itching    Review of Systems  Constitutional: Positive for chills, fatigue and fever. Negative for unexpected weight change.  HENT: Positive for postnasal drip and rhinorrhea. Negative for congestion and sore throat.   Eyes: Negative for redness and visual disturbance.  Respiratory: Positive for cough. Negative for shortness of breath and stridor.   Cardiovascular: Negative for chest pain, palpitations  and leg swelling.  Gastrointestinal: Positive for abdominal pain, diarrhea and nausea.  Genitourinary: Negative for difficulty urinating and dysuria.  Musculoskeletal: Positive for arthralgias, back pain and myalgias.  Neurological: Negative for light-headedness and headaches.  Psychiatric/Behavioral: Positive for sleep disturbance. Negative for dysphoric mood. The patient is not nervous/anxious.      BP 124/78 (BP Location: Left Arm, Patient Position: Sitting, Cuff Size: Large)   Pulse 84   Temp 98.9 F  (37.2 C) (Temporal)   Resp 18   Ht 5\' 6"  (1.676 m)   Wt 248 lb 1.9 oz (112.5 kg)   LMP 10/24/2012   SpO2 99%   BMI 40.05 kg/m   Physical Exam  Constitutional: She is oriented to person, place, and time. She appears well-developed and well-nourished. She appears distressed.  Moderately uncomfortable  HENT:  Head: Normocephalic and atraumatic.  Right Ear: External ear normal.  Left Ear: External ear normal.  Nose: Nose normal.  Mouth/Throat: Oropharynx is clear and moist.  Clear rhinorrhea  Eyes: Conjunctivae are normal. Pupils are equal, round, and reactive to light.  Neck: Normal range of motion. Neck supple.  Cardiovascular: Normal rate, regular rhythm and normal heart sounds.  Pulmonary/Chest: Effort normal and breath sounds normal. She has no wheezes.  Abdominal: Soft. Bowel sounds are normal. There is no tenderness.  No organomegaly  Lymphadenopathy:    She has no cervical adenopathy.  Neurological: She is alert and oriented to person, place, and time.  Skin: No rash noted.  Psychiatric: She has a normal mood and affect. Her behavior is normal.    ASSESSMENT/PLAN:  1. Diarrhea, unspecified type  2. Rhinorrhea  Patient believes her symptoms are infectious.  In that case it would certainly be a virus.  We discussed symptomatic care.  I did present her the possibility, depending on her use, that this could be opioid withdrawal.  I did congratulate her on her effort to stop her narcotic medications.  She states she actually has less pain than she did before .  She will call me if not better in a couple of days   Patient Instructions  Rest Push fluids Imodium for the diarrhea Call if not better in 2-3 days   Raylene Everts, MD

## 2017-09-20 ENCOUNTER — Telehealth: Payer: Self-pay

## 2017-09-20 ENCOUNTER — Other Ambulatory Visit: Payer: Self-pay | Admitting: Family Medicine

## 2017-09-20 DIAGNOSIS — R05 Cough: Secondary | ICD-10-CM

## 2017-09-20 DIAGNOSIS — R059 Cough, unspecified: Secondary | ICD-10-CM

## 2017-09-20 NOTE — Telephone Encounter (Signed)
Pt called and said cold has gone into her chest now.  Want to be seen again today. Please call.

## 2017-09-21 ENCOUNTER — Ambulatory Visit (HOSPITAL_COMMUNITY)
Admission: RE | Admit: 2017-09-21 | Discharge: 2017-09-21 | Disposition: A | Discharge status: Home / Self Care | Payer: PPO | Source: Ambulatory Visit | Attending: Family Medicine | Admitting: Family Medicine

## 2017-09-21 DIAGNOSIS — R059 Cough, unspecified: Secondary | ICD-10-CM

## 2017-09-21 DIAGNOSIS — J40 Bronchitis, not specified as acute or chronic: Secondary | ICD-10-CM | POA: Insufficient documentation

## 2017-09-21 DIAGNOSIS — R05 Cough: Secondary | ICD-10-CM | POA: Diagnosis not present

## 2017-09-21 NOTE — Progress Notes (Signed)
[name] [name] is aware.

## 2017-09-27 NOTE — Telephone Encounter (Signed)
Followed up with pt

## 2017-10-04 ENCOUNTER — Telehealth: Payer: Self-pay | Admitting: Adult Health

## 2017-10-04 DIAGNOSIS — M6250 Muscle wasting and atrophy, not elsewhere classified, unspecified site: Secondary | ICD-10-CM | POA: Diagnosis not present

## 2017-10-04 DIAGNOSIS — M170 Bilateral primary osteoarthritis of knee: Secondary | ICD-10-CM | POA: Diagnosis not present

## 2017-10-04 DIAGNOSIS — M961 Postlaminectomy syndrome, not elsewhere classified: Secondary | ICD-10-CM | POA: Diagnosis not present

## 2017-10-04 DIAGNOSIS — G894 Chronic pain syndrome: Secondary | ICD-10-CM | POA: Diagnosis not present

## 2017-10-04 NOTE — Telephone Encounter (Signed)
Can change to Dulera 200 2 puffs Twice daily  In place of Symbicort

## 2017-10-04 NOTE — Telephone Encounter (Addendum)
Spoke with pt, she states her Symbicort copay is too high. I placed a sample up front for her to pick up so we can have time decide which medication will be cheaper. She states the Symbicort really works for her and she does not want to switch but she will not be able to afford the cost. She does not have a formulary at this time. I looked up [employer] [facility] formulary but will have to know which inhaler is equivalent to Symbicort. Please advise. So far Flovent, Dulera, and advair are tier 3.

## 2017-10-04 NOTE — Telephone Encounter (Signed)
Left message for patient to call back  

## 2017-10-06 MED ORDER — MOMETASONE FURO-FORMOTEROL FUM 200-5 MCG/ACT IN AERO: 2.0000 | INHALATION_SPRAY | Freq: Two times a day (BID) | RESPIRATORY_TRACT | 5 refills | Status: DC

## 2017-10-06 NOTE — Telephone Encounter (Signed)
Called and spoke with patient regarding TP recommendations Can use Dulera 200 at 2 puffs twice daily in place of symbicort Placed order with pharmacy Dulera 200 Nothing further needed at this time

## 2017-10-07 ENCOUNTER — Other Ambulatory Visit: Payer: Self-pay | Admitting: Family Medicine

## 2017-10-07 ENCOUNTER — Ambulatory Visit (HOSPITAL_COMMUNITY): Payer: PPO

## 2017-10-07 DIAGNOSIS — Z1231 Encounter for screening mammogram for malignant neoplasm of breast: Secondary | ICD-10-CM

## 2017-10-12 ENCOUNTER — Ambulatory Visit (HOSPITAL_COMMUNITY)
Admission: RE | Admit: 2017-10-12 | Discharge: 2017-10-12 | Disposition: A | Discharge status: Home / Self Care | Payer: PPO | Source: Ambulatory Visit | Attending: Family Medicine | Admitting: Family Medicine

## 2017-10-12 DIAGNOSIS — Z1231 Encounter for screening mammogram for malignant neoplasm of breast: Secondary | ICD-10-CM | POA: Diagnosis not present

## 2017-10-17 ENCOUNTER — Ambulatory Visit (INDEPENDENT_AMBULATORY_CARE_PROVIDER_SITE_OTHER): Payer: PPO | Admitting: Orthopaedic Surgery

## 2017-10-18 ENCOUNTER — Ambulatory Visit (INDEPENDENT_AMBULATORY_CARE_PROVIDER_SITE_OTHER): Payer: PPO | Admitting: Orthopaedic Surgery

## 2017-11-16 ENCOUNTER — Other Ambulatory Visit: Payer: Self-pay

## 2017-11-16 MED ORDER — ALBUTEROL SULFATE (2.5 MG/3ML) 0.083% IN NEBU: INHALATION_SOLUTION | RESPIRATORY_TRACT | 1 refills | Status: DC

## 2017-12-07 ENCOUNTER — Encounter: Payer: Self-pay | Admitting: Family Medicine

## 2017-12-26 DIAGNOSIS — M170 Bilateral primary osteoarthritis of knee: Secondary | ICD-10-CM | POA: Diagnosis not present

## 2017-12-26 DIAGNOSIS — M25551 Pain in right hip: Secondary | ICD-10-CM | POA: Diagnosis not present

## 2017-12-26 DIAGNOSIS — M6250 Muscle wasting and atrophy, not elsewhere classified, unspecified site: Secondary | ICD-10-CM | POA: Diagnosis not present

## 2017-12-26 DIAGNOSIS — M961 Postlaminectomy syndrome, not elsewhere classified: Secondary | ICD-10-CM | POA: Diagnosis not present

## 2017-12-26 DIAGNOSIS — G894 Chronic pain syndrome: Secondary | ICD-10-CM | POA: Diagnosis not present

## 2017-12-26 DIAGNOSIS — Z79891 Long term (current) use of opiate analgesic: Secondary | ICD-10-CM | POA: Diagnosis not present

## 2018-01-10 ENCOUNTER — Encounter: Payer: PPO | Admitting: Family Medicine

## 2018-02-16 ENCOUNTER — Encounter: Payer: Self-pay | Admitting: Family Medicine

## 2018-02-17 ENCOUNTER — Encounter: Payer: Self-pay | Admitting: Family Medicine

## 2018-03-23 ENCOUNTER — Encounter: Payer: PPO | Admitting: Family Medicine

## 2018-03-23 DIAGNOSIS — M170 Bilateral primary osteoarthritis of knee: Secondary | ICD-10-CM | POA: Diagnosis not present

## 2018-03-23 DIAGNOSIS — M6250 Muscle wasting and atrophy, not elsewhere classified, unspecified site: Secondary | ICD-10-CM | POA: Diagnosis not present

## 2018-03-23 DIAGNOSIS — G894 Chronic pain syndrome: Secondary | ICD-10-CM | POA: Diagnosis not present

## 2018-03-23 DIAGNOSIS — M961 Postlaminectomy syndrome, not elsewhere classified: Secondary | ICD-10-CM | POA: Diagnosis not present

## 2018-04-27 ENCOUNTER — Ambulatory Visit (INDEPENDENT_AMBULATORY_CARE_PROVIDER_SITE_OTHER): Payer: PPO | Admitting: Family Medicine

## 2018-04-27 ENCOUNTER — Encounter: Payer: Self-pay | Admitting: Family Medicine

## 2018-04-27 VITALS — BP 128/86 | HR 70 | Ht 66.0 in | Wt 261.0 lb

## 2018-04-27 DIAGNOSIS — R7303 Prediabetes: Secondary | ICD-10-CM

## 2018-04-27 DIAGNOSIS — E785 Hyperlipidemia, unspecified: Secondary | ICD-10-CM

## 2018-04-27 DIAGNOSIS — Z23 Encounter for immunization: Secondary | ICD-10-CM | POA: Diagnosis not present

## 2018-04-27 DIAGNOSIS — R030 Elevated blood-pressure reading, without diagnosis of hypertension: Secondary | ICD-10-CM | POA: Diagnosis not present

## 2018-04-27 DIAGNOSIS — Z6841 Body Mass Index (BMI) 40.0 and over, adult: Secondary | ICD-10-CM | POA: Diagnosis not present

## 2018-04-27 DIAGNOSIS — M533 Sacrococcygeal disorders, not elsewhere classified: Secondary | ICD-10-CM | POA: Diagnosis not present

## 2018-04-27 DIAGNOSIS — M5440 Lumbago with sciatica, unspecified side: Secondary | ICD-10-CM | POA: Diagnosis not present

## 2018-04-27 DIAGNOSIS — E039 Hypothyroidism, unspecified: Secondary | ICD-10-CM

## 2018-04-27 DIAGNOSIS — J4530 Mild persistent asthma, uncomplicated: Secondary | ICD-10-CM | POA: Diagnosis not present

## 2018-04-27 NOTE — Progress Notes (Signed)
Patient ID: [name] [name], female    DOB: [birth year], 57 y.o.   MRN: [id number]  Chief Complaint  Patient presents with  . Thyroid Problem    follow up  . Back Problem    pt stated had back surgey 2014 ---fell 2 weeks ago--having lower back pain--appt with Dr. Suzzanne Cloud today    Allergies Celebrex [celecoxib]; Nsaids; and Percocet [oxycodone-acetaminophen]  Subjective:   [name] [name] is a 57 y.o. female who presents to [facility] today.  HPI Here for follow up visit.  Has previously been seen by Dr. Lysle Morales and was establishing care with me as her PCP today.  She reports that she is here for thyroid follow up. Has been hypothyroid and on medication for many years.  She reports that over the past several years her dose has been escalating.  Upon questioning, she reports that she takes medication in the morning but not on an empty stomach.  She reports she has not been taking the medication as she should because she did not really know that it was important.   She reports that she does have a history of asthma and has been followed by pulmonology.  She reports that she has not been using her Symbicort as directed.  She would like to get a flu shot and is due for pneumonia shot today.  She reports that she was hospitalized with a bad lung infection several years ago.  She reports that she occasionally does have to use her albuterol inhaler if she exerts herself because she gets wheezing.  She does not really know why she has not been using the inhaler as directed but did not feel like it was absolutely necessary.   Reports she is also been experiencing some back pain secondary to a fall and she has an appointment with back surgeon today b/c of back pain. Has had multiple surgeries in the past for her back and is followed by neurosurgery and pain management.  She is not here to discuss this problem today. She also reports that her cholesterol is been very high in the past.   She reports her diet is not very good.  She is morbidly obese and understands that she needs to lose weight.  Her exercise is limited due to her back pain.  She would be interested in seeing a nutritionist.  She was also diagnosed last year with prediabetes.  She has never been on any cholesterol medication. She denies any chest pain, palpitations, lower extremity edema.  She is followed by psychiatry for her mood.  She reports she does not feel down, depressed, or hopeless.  She is taking her Wellbutrin once a day and the Zoloft as directed.  She denies any suicidal or homicidal ideology.   Past Medical History:  Diagnosis Date  . Allergy   . Anxiety   . Arthritis    low back  . Asthma   . Carpal tunnel syndrome 01/13/2015   Bilateral  . Depression   . FIBROIDS, UTERUS 10/17/2006   Qualifier: Diagnosis of  By: Jonna Munro MD, Roderic Scarce    . Hyperlipidemia   . Nerve damage    right leg  . Neuromuscular disorder (HCC)    fibromyalgia  . 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 Thyroid disease    hypo thyroid    Past Surgical History:  Procedure Laterality Date  . ANTERIOR CERVICAL DECOMP/DISCECTOMY FUSION     L4, L5   .  SHOULDER ARTHROSCOPY WITH ROTATOR CUFF REPAIR AND SUBACROMIAL DECOMPRESSION    . TONSILLECTOMY    . VIDEO ASSISTED THORACOSCOPY (VATS)/DECORTICATION Right 09/24/2015   Procedure: VIDEO ASSISTED THORACOSCOPY (VATS)/DECORTICATION;  Surgeon: Melrose Nakayama, MD;  Location: [facility];  Service: Thoracic;  Laterality: Right;  [name] VIDEO BRONCHOSCOPY N/A 09/24/2015   Procedure: VIDEO BRONCHOSCOPY;  Surgeon: Melrose Nakayama, MD;  Location: [facility] OR;  Service: Thoracic;  Laterality: N/A;    Family History  Problem Relation Age of Onset  . Bone cancer Father   . Asthma Father   . Diabetes Father   . Alcohol abuse Father   . Cancer Father   . COPD Father   . Heart disease Father   . Hypertension Father   . Hyperlipidemia Father   . Pneumonia Mother         biological mother deceased at age 75  . Depression Mother   . Alcohol abuse Sister   . Alcohol abuse Brother   . Alcohol abuse Sister   . Alcohol abuse Brother   . Colon cancer Neg Hx   . Esophageal cancer Neg Hx   . Stomach cancer Neg Hx   . Rectal cancer Neg Hx      Social History   Socioeconomic History  . Marital status: Significant Other    Spouse name: Dianna  . Number of children: Not on file  . Years of education: 2  . Highest education level: Not on file  Occupational History  . Occupation: Disabled    Comment: Electrical engineer  Social Needs  . Financial resource strain: Not on file  . Food insecurity:    Worry: Not on file    Inability: Not on file  . Transportation needs:    Medical: Not on file    Non-medical: Not on file  Tobacco Use  . Smoking status: Current Every Day Smoker    Packs/day: 0.10    Types: E-cigarettes, Cigarettes    Start date: 04/16/1971  . Smokeless tobacco: Former Systems developer    Types: Snuff, Sarina Ser    Quit date: 09/14/2007  Substance and Sexual Activity  . Alcohol use: No    Alcohol/week: 0.0 standard drinks    Comment: Quit 11/11/1989  . Drug use: No    Types: "Crack" cocaine, Marijuana    Comment: Quit in 1991  . Sexual activity: Yes    Birth control/protection: Post-menopausal  Lifestyle  . Physical activity:    Days per week: Not on file    Minutes per session: Not on file  . Stress: Not on file  Relationships  . Social connections:    Talks on phone: Not on file    Gets together: Not on file    Attends religious service: Not on file    Active member of club or organization: Not on file    Attends meetings of clubs or organizations: Not on file    Relationship status: Not on file  Other Topics Concern  . Not on file  Social History Narrative   Originally from [place], [place]. Previously has lived in [place], [place], [place], [place], [place], [place], [place]. She moved to [place] in 1995. 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 Currently has dogs and a cat and chickens . She has an outdoor hot tub that they use regularly. Previously had mold & mildew in their home for approximately 1 year.    Lives  with [place]   Current Outpatient Medications on File Prior to Visit  Medication Sig Dispense Refill  . albuterol (PROVENTIL HFA;VENTOLIN HFA) 108 (90 Base) MCG/ACT inhaler Inhale 1-2 puffs every 4 (four) hours as needed into the lungs for wheezing or shortness of breath. 6 Inhaler 1  . albuterol (PROVENTIL) (2.5 MG/3ML) 0.083% nebulizer solution INHALE CONTENTS OF ONE VIAL VIA NEBULIZER EVERY 4 HOURS AS NEEDED 120 mL 1  . budesonide-formoterol (SYMBICORT) 160-4.5 MCG/ACT inhaler Inhale 2 puffs 2 (two) times daily into the lungs. 1 Inhaler 6  . buPROPion (WELLBUTRIN SR) 200 MG 12 hr tablet Take 200 mg by mouth 2 (two) times daily.    [name] gabapentin (NEURONTIN) 600 MG tablet Take 600 mg by mouth 3 (three) times daily.    [name] levothyroxine (SYNTHROID, LEVOTHROID) 175 MCG tablet TAKE 1 TABLET BY MOUTH DAILY BEFORE BREAKFAST. 90 tablet 3  . morphine (MS CONTIN) 15 MG 12 hr tablet Take by mouth.    . morphine (MSIR) 15 MG tablet Take by mouth.    . sertraline (ZOLOFT) 100 MG tablet Take 100 mg by mouth daily.    [name] Spacer/Aero Chamber Mouthpiece MISC 1 Device as directed by Does not apply route. 1 each 0  . tizanidine (ZANAFLEX) 2 MG capsule Take 2 mg by mouth 3 times/day as needed-between meals & bedtime. Reported on 12/11/2015     No current facility-administered medications on file prior to visit.     Review of Systems   Objective:   BP 128/86 (BP Location: Left Arm, Patient Position: Sitting, Cuff Size: Large)   Pulse 70   Ht 5\' 6"  (1.676 m)   Wt 261 lb (118.4 kg)   LMP 10/24/2012   SpO2 97%   BMI 42.13 kg/m   Physical Exam  Constitutional: She is oriented to person, place, and time. She appears well-developed and well-nourished. No  distress.  HENT:  Head: Normocephalic and atraumatic.  Eyes: Pupils are equal, round, and reactive to light.  Neck: Normal range of motion. Neck supple. No JVD present. No tracheal deviation present. No thyromegaly present.  Cardiovascular: Normal rate, regular rhythm and normal heart sounds.  Pulmonary/Chest: Effort normal and breath sounds normal. No respiratory distress. She has no wheezes.  Lymphadenopathy:    She has no cervical adenopathy.  Neurological: She is alert and oriented to person, place, and time. No cranial nerve deficit.  Skin: Skin is warm and dry.  Psychiatric: She has a normal mood and affect. Her behavior is normal. Judgment and thought content normal.  Nursing note and vitals reviewed.    Assessment and Plan  1. Prediabetes Discussed diet, exercise, and weight loss modifications with patient today.  She is agreeable to seeing nutritionist and believes this would help many aspects of her health.  Referral was placed today. - Amb ref to Medical Nutrition Therapy-MNT  2. Morbid obesity ([place]) The patient is asked to make an attempt to improve diet and exercise patterns to aid in medical management of this problem. Patient counseled in detail regarding the risks of medication. Told to call or return to clinic if develop any worrisome signs or symptoms. Patient voiced understanding.   - Amb ref to Medical Nutrition Therapy-[facility]  3. Mild persistent asthma without complication Vaccination given today.  She was encouraged to use her asthma inhalers as directed.  She reports she will follow-up with her pulmonologist as directed. - Pneumococcal polysaccharide vaccine 23-valent greater than or equal to 2yo subcutaneous/IM  4. Hypothyroidism, unspecified type We will defer testing of TSH today.  Patient reports that she will take her medication as directed and follow-up with new PCP office in 2 months for her labs.  5. Need for immunization against influenza Vaccination  given. - Flu Vaccine QUAD 36+ mos IM  6. Hyperlipidemia, unspecified hyperlipidemia type Cholesterol was discussed with patient today.  She is can make dietary changes and also follow-up with the nutritionist.  We will plan on rechecking cholesterol in 2 to 3 months. We did discuss how cholesterol that is uncontrolled increases her cardiovascular risk factors.  She will follow-up with psychiatry as directed.  She will continue her medications.  She will call with any questions or concerns.  Today the office visit was greater than 25 minutes.  Greater than 50% of office visit was spent counseling and coordinating care.  Today we discussed all of her medicines, route of administration, and reasons for taking medications as directed.  We also discussed diet modifications which could aid with weight loss.  We also discussed low-cholesterol and decrease sugar intake. No follow-ups on file. Caren Macadam, MD 04/27/2018

## 2018-04-27 NOTE — Patient Instructions (Signed)
Shringrix/Shingle vaccine-call insurance  Referral done for nutrition/[name] [name] You need to take your thyroid medication and get labs in 2-3 monthsLevothyroxine tablets What is this medicine? LEVOTHYROXINE (lee voe thye ROX een) is a thyroid hormone. This medicine can improve symptoms of thyroid deficiency such as slow speech, lack of energy, weight gain, hair loss, dry skin, and feeling cold. It also helps to treat goiter (an enlarged thyroid gland). It is also used to treat some kinds of thyroid cancer along with surgery and other medicines. This medicine may be used for other purposes; ask your health care provider or pharmacist if you have questions. COMMON BRAND NAME(S): Estre, Levo-T, Levothroid, Levoxyl, Synthroid, Thyro-Tabs, Unithroid What should I tell my health care provider before I take this medicine? They need to know if you have any of these conditions: -angina -blood clotting problems -diabetes -dieting or on a weight loss program -fertility problems -heart disease -high levels of thyroid hormone -pituitary gland problem -previous heart attack -an unusual or allergic reaction to levothyroxine, thyroid hormones, other medicines, foods, dyes, or preservatives -pregnant or trying to get pregnant -breast-feeding How should I use this medicine? Take this medicine by mouth with plenty of water. It is best to take on an empty stomach, at least 30 minutes before or 2 hours after food. Follow the directions on the prescription label. Take at the same time each day. Do not take your medicine more often than directed. Contact your pediatrician regarding the use of this medicine in children. While this drug may be prescribed for children and infants as young as a few days of age for selected conditions, precautions do apply. For infants, you may crush the tablet and place in a small amount of (5-10 ml or 1 to 2 teaspoonfuls) of water, breast milk, or non-soy based infant formula. Do  not mix with soy-based infant formula. Give as directed. Overdosage: If you think you have taken too much of this medicine contact a poison control center or emergency room at once. NOTE: This medicine is only for you. Do not share this medicine with others. What if I miss a dose? If you miss a dose, take it as soon as you can. If it is almost time for your next dose, take only that dose. Do not take double or extra doses. What may interact with this medicine? -amiodarone -antacids -anti-thyroid medicines -calcium supplements -carbamazepine -cholestyramine -colestipol -digoxin -female hormones, including contraceptive or birth control pills -iron supplements -ketamine -liquid nutrition products like Ensure -medicines for colds and breathing difficulties -medicines for diabetes -medicines for mental depression -medicines or herbals used to decrease weight or appetite -phenobarbital or other barbiturate medications -phenytoin -prednisone or other corticosteroids -rifabutin -rifampin -soy isoflavones -sucralfate -theophylline -warfarin This list may not describe all possible interactions. Give your health care provider a list of all the medicines, herbs, non-prescription drugs, or dietary supplements you use. Also tell them if you smoke, drink alcohol, or use illegal drugs. Some items may interact with your medicine. What should I watch for while using this medicine? Be sure to take this medicine with plenty of fluids. Some tablets may cause choking, gagging, or difficulty swallowing from the tablet getting stuck in your throat. Most of these problems disappear if the medicine is taken with the right amount of water or other fluids. Do not switch brands of this medicine unless your health care professional agrees with the change. Ask questions if you are uncertain. You will need regular exams and occasional blood tests  to check the response to treatment. If you are receiving this  medicine for an underactive thyroid, it may be several weeks before you notice an improvement. Check with your doctor or health care professional if your symptoms do not improve. It may be necessary for you to take this medicine for the rest of your life. Do not stop using this medicine unless your doctor or health care professional advises you to. This medicine can affect blood sugar levels. If you have diabetes, check your blood sugar as directed. You may lose some of your hair when you first start treatment. With time, this usually corrects itself. If you are going to have surgery, tell your doctor or health care professional that you are taking this medicine. What side effects may I notice from receiving this medicine? Side effects that you should report to your doctor or health care professional as soon as possible: -allergic reactions like skin rash, itching or hives, swelling of the face, lips, or tongue -chest pain -excessive sweating or intolerance to heat -fast or irregular heartbeat -nervousness -skin rash or hives -swelling of ankles, feet, or legs -tremors Side effects that usually do not require medical attention (report to your doctor or health care professional if they continue or are bothersome): -changes in appetite -changes in menstrual periods -diarrhea -hair loss -headache -trouble sleeping -weight loss This list may not describe all possible side effects. Call your doctor for medical advice about side effects. You may report side effects to FDA at 1-800-FDA-1088. Where should I keep my medicine? Keep out of the reach of children. Store at room temperature between 15 and 30 degrees C (59 and 86 degrees F). Protect from light and moisture. Keep container tightly closed. Throw away any unused medicine after the expiration date. NOTE: This sheet is a summary. It may not cover all possible information. If you have questions about this medicine, talk to your doctor,  pharmacist, or health care provider.  2018 Elsevier/Gold Standard (2008-12-06 14:28:07)  Hypothyroidism Hypothyroidism is a disorder of the thyroid. The thyroid is a large gland that is located in the lower front of the neck. The thyroid releases hormones that control how the body works. With hypothyroidism, the thyroid does not make enough of these hormones. What are the causes? Causes of hypothyroidism may include:  Viral infections.  Pregnancy.  Your own defense system (immune system) attacking your thyroid.  Certain medicines.  Birth defects.  Past radiation treatments to your head or neck.  Past treatment with radioactive iodine.  Past surgical removal of part or all of your thyroid.  Problems with the gland that is located in the center of your brain (pituitary).  What are the signs or symptoms? Signs and symptoms of hypothyroidism may include:  Feeling as though you have no energy (lethargy).  Inability to tolerate cold.  Weight gain that is not explained by a change in diet or exercise habits.  Dry skin.  Coarse hair.  Menstrual irregularity.  Slowing of thought processes.  Constipation.  Sadness or depression.  How is this diagnosed? Your health care provider may diagnose hypothyroidism with blood tests and ultrasound tests. How is this treated? Hypothyroidism is treated with medicine that replaces the hormones that your body does not make. After you begin treatment, it may take several weeks for symptoms to go away. Follow these instructions at home:  Take medicines only as directed by your health care provider.  If you start taking any new medicines, tell your health care  provider.  Keep all follow-up visits as directed by your health care provider. This is important. As your condition improves, your dosage needs may change. You will need to have blood tests regularly so that your health care provider can watch your condition. Contact a health  care provider if:  Your symptoms do not get better with treatment.  You are taking thyroid replacement medicine and: ? You sweat excessively. ? You have tremors. ? You feel anxious. ? You lose weight rapidly. ? You cannot tolerate heat. ? You have emotional swings. ? You have diarrhea. ? You feel weak. Get help right away if:  You develop chest pain.  You develop an irregular heartbeat.  You develop a rapid heartbeat. This information is not intended to replace advice given to you by your health care provider. Make sure you discuss any questions you have with your health care provider. Document Released: 08/30/2005 Document Revised: 02/05/2016 Document Reviewed: 01/15/2014 Elsevier Interactive Patient Education  2018 [employer].

## 2018-06-16 DIAGNOSIS — M5416 Radiculopathy, lumbar region: Secondary | ICD-10-CM | POA: Diagnosis not present

## 2018-06-16 DIAGNOSIS — M170 Bilateral primary osteoarthritis of knee: Secondary | ICD-10-CM | POA: Diagnosis not present

## 2018-06-16 DIAGNOSIS — M961 Postlaminectomy syndrome, not elsewhere classified: Secondary | ICD-10-CM | POA: Diagnosis not present

## 2018-06-19 ENCOUNTER — Ambulatory Visit: Payer: Medicare Other | Admitting: Nutrition

## 2018-06-19 DIAGNOSIS — M722 Plantar fascial fibromatosis: Secondary | ICD-10-CM | POA: Diagnosis not present

## 2018-06-19 DIAGNOSIS — M79671 Pain in right foot: Secondary | ICD-10-CM | POA: Diagnosis not present

## 2018-06-19 DIAGNOSIS — M79672 Pain in left foot: Secondary | ICD-10-CM | POA: Diagnosis not present

## 2018-07-13 DIAGNOSIS — M961 Postlaminectomy syndrome, not elsewhere classified: Secondary | ICD-10-CM | POA: Diagnosis not present

## 2018-07-31 DIAGNOSIS — M722 Plantar fascial fibromatosis: Secondary | ICD-10-CM | POA: Diagnosis not present

## 2018-07-31 DIAGNOSIS — M79671 Pain in right foot: Secondary | ICD-10-CM | POA: Diagnosis not present

## 2018-07-31 DIAGNOSIS — M79672 Pain in left foot: Secondary | ICD-10-CM | POA: Diagnosis not present

## 2018-08-01 ENCOUNTER — Ambulatory Visit: Payer: PPO | Admitting: Nutrition

## 2018-08-03 DIAGNOSIS — R7303 Prediabetes: Secondary | ICD-10-CM | POA: Diagnosis not present

## 2018-08-03 DIAGNOSIS — R030 Elevated blood-pressure reading, without diagnosis of hypertension: Secondary | ICD-10-CM | POA: Diagnosis not present

## 2018-08-03 DIAGNOSIS — E039 Hypothyroidism, unspecified: Secondary | ICD-10-CM | POA: Diagnosis not present

## 2018-08-03 DIAGNOSIS — B001 Herpesviral vesicular dermatitis: Secondary | ICD-10-CM | POA: Diagnosis not present

## 2018-08-22 DIAGNOSIS — F4310 Post-traumatic stress disorder, unspecified: Secondary | ICD-10-CM | POA: Diagnosis not present

## 2018-08-24 ENCOUNTER — Telehealth: Payer: Self-pay | Admitting: Adult Health

## 2018-08-24 MED ORDER — BUDESONIDE-FORMOTEROL FUMARATE 160-4.5 MCG/ACT IN AERO: 2.0000 | INHALATION_SPRAY | Freq: Two times a day (BID) | RESPIRATORY_TRACT | 0 refills | Status: DC

## 2018-08-24 NOTE — Telephone Encounter (Signed)
Spoke with the pt She is requesting refill in her symbicort  I advised will refill x 1 only and needs to keep appt with TP for 08/28/18  She verbalized understanding  Rx was sent

## 2018-08-25 DIAGNOSIS — M5416 Radiculopathy, lumbar region: Secondary | ICD-10-CM | POA: Diagnosis not present

## 2018-08-28 ENCOUNTER — Ambulatory Visit (INDEPENDENT_AMBULATORY_CARE_PROVIDER_SITE_OTHER): Payer: PPO | Admitting: Adult Health

## 2018-08-28 ENCOUNTER — Encounter: Payer: Self-pay | Admitting: Adult Health

## 2018-08-28 VITALS — BP 108/66 | HR 65 | Ht 66.0 in | Wt 260.4 lb

## 2018-08-28 DIAGNOSIS — J4520 Mild intermittent asthma, uncomplicated: Secondary | ICD-10-CM

## 2018-08-28 DIAGNOSIS — J984 Other disorders of lung: Secondary | ICD-10-CM

## 2018-08-28 DIAGNOSIS — J00 Acute nasopharyngitis [common cold]: Secondary | ICD-10-CM | POA: Diagnosis not present

## 2018-08-28 LAB — NITRIC OXIDE: Nitric Oxide: <5

## 2018-08-28 MED ORDER — PREDNISONE 10 MG PO TABS: ORAL_TABLET | ORAL | 0 refills | Status: DC

## 2018-08-28 MED ORDER — ALBUTEROL SULFATE HFA 108 (90 BASE) MCG/ACT IN AERS: 1.0000 | INHALATION_SPRAY | RESPIRATORY_TRACT | 1 refills | Status: DC | PRN

## 2018-08-28 NOTE — Assessment & Plan Note (Signed)
Spirometry is stable.  Chest x-ray earlier this year stable.

## 2018-08-28 NOTE — Assessment & Plan Note (Addendum)
Mild exacerbation with suspected rhinitis flare Spirometry and exhaled nitric oxide testing okay  Give a short prednisone taper to have on hold if symptoms do not improve. Patient education given  Plan  Patient Instructions  Continue on Symbicort 2 puffs Twice daily. Rinse after use.  Albuterol As needed  Wheezing .  Begin Zyrtec 10mg  At bedtime  For 2 weeks then daily As needed   Begin Flonase Nasal 2 puffs daily for 2 weeks then daily As needed   Prednisone taper to have on hold if symptoms do not improve and wheezing/cough continue  Follow up with Dr. Vaughan Browner in 3-4 months and As needed   Please contact office for sooner follow up if symptoms do not improve or worsen or seek emergency care

## 2018-08-28 NOTE — Patient Instructions (Signed)
Continue on Symbicort 2 puffs Twice daily. Rinse after use.  Albuterol As needed  Wheezing .  Begin Zyrtec 10mg  At bedtime  For 2 weeks then daily As needed   Begin Flonase Nasal 2 puffs daily for 2 weeks then daily As needed   Prednisone taper to have on hold if symptoms do not improve and wheezing/cough continue  Follow up with Dr. Vaughan Browner in 3-4 months and As needed   Please contact office for sooner follow up if symptoms do not improve or worsen or seek emergency care

## 2018-08-28 NOTE — Assessment & Plan Note (Signed)
Mild flare Add Flonase and Zyrtec

## 2018-08-28 NOTE — Progress Notes (Signed)
@Patient  ID: [name] [name], female    DOB: July 03, 1961, 57 y.o.   MRN: [id number]  Chief Complaint  Patient presents with  . Follow-up    Asthma     Referring provider: No ref. provider found  HPI: 57 year old female former smoker, cigarettes and e cigs , followed for moderate persistent asthma And mild restrictive lung disease.-Previous VATS and decortication in January 2017 for empyema. 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, Dealer, lived at multiple places including [place], [place], [place], [place] and [place] Has outdoor hot tub   TEST/EVENTS :  03/31/17: FVC 2.63 L (71%) FEV1 2.04 L (71%) FEV1/FVC 0.78 FEF 25-75 1.65 L (62%) negative bronchodilator response TLC 3.91 L (73%) RV 68% ERV 27% DLCO uncorrected 95% 04/28/16: FVC 2.70 L (73%) FEV1 2.00 L (69%) FEV1/FVC 0.74 FEF 25-75 1.14 L (53%) negative bronchodilator response TLC 5.11 L (95%) RV 113% ERV 7% DLCO corrected 97% (hgb 15.7)  6MWT 04/28/16:  Walked 452 meters / Baseline Sat 100% on RA / Nadir Sat 94% on RA  Right Pleural Fluid (09/24/15):  No malignant cells. Right Pleural Fluid (09/22/15):  No malignant cells. Right Pleural Peel (09/22/15):  No atypia or malignancy identified. Benign organizing fibrin inflammatory debris.  MICROBIOLOGY Right Pleura Visceral & Parietal (09/24/15): Negative Right Pleural Fluid (09/24/15):  Negative  Right Pleural Fluid (09/24/15):  Negative  Right Bronchial Wash (09/24/15):  Negative  Right Pleural Fluid (09/22/15):  Negative   LABS PFA (09/22/15) Glucose:  <20 LDH:  3339 Total protein: 5.2 (serum 6.7) PH: 7.4 WBC:  7830 (neutro 36%, monocytes 53%, lymphs 11%)  08/28/2018 Follow up: Asthma, Restrictive lung dz. Patient presents for a 1 year follow-up.  Patient has underlying moderate persistent asthma.  She says overall breathing has been doing well until 3 weeks ago , started to have some nasal drainage and drip ,  intermittent dry cough and wheezing. No fever , discolored mucus, chest pain or orthopnea.  Recently restarted back on Symbicort 1 month ago.  Spirometry shows no significant change in mild restrictive  with an FEV1 at 74%, ratio 76, FVC 76%.  Exhaled nitric oxide testing today is normal (less than 5) She remains on Symbicort 2 puffs twice daily.  Has increased her albuterol use in the last 2 to 3 weeks.  Usually uses it once or twice a week however over the last couple weeks has been using it daily.  Previous history of right-sided VATS and decortication for empyema in January 2017.  Chest x-ray January 2019 showed stable changes.   Allergies  Allergen Reactions  . Celebrex [Celecoxib] Nausea And Vomiting    Severe epigastric pain  . Nsaids Other (See Comments)    SEVERE EPIGASTRIC PAIN  . Percocet [Oxycodone-Acetaminophen] Itching    Immunization History  Administered Date(s) Administered  . Influenza Whole 07/20/2007  . Influenza,inj,Quad PF,6+ Mos 11/30/2016, 07/12/2017, 04/27/2018  . Pneumococcal Conjugate-13 11/13/2015  . Pneumococcal Polysaccharide-23 07/20/2007, 04/27/2018  . Td 06/06/2007  . Tdap 03/15/2015    Past Medical History:  Diagnosis Date  . Allergy   . Anxiety   . Arthritis    low back  . Asthma   . Carpal tunnel syndrome 01/13/2015   Bilateral  . Depression   . FIBROIDS, UTERUS 10/17/2006   Qualifier: Diagnosis of  By: Jonna Munro MD, Roderic Scarce    . Hyperlipidemia   . Nerve damage    right leg  . Neuromuscular disorder (HCC)    fibromyalgia  .  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 Thyroid disease    hypo thyroid    Tobacco History: Social History   Tobacco Use  Smoking Status Former Smoker  . Packs/day: 1.00  . Types: E-cigarettes, Cigarettes  . Start date: 04/16/1971  . Last attempt to quit: 09/14/2007  . Years since quitting: 10.9  Smokeless Tobacco Former Systems developer  . Types: Snuff, Chew  . Quit date: 09/14/2007   Counseling given: Not  Answered   Outpatient Medications Prior to Visit  Medication Sig Dispense Refill  . albuterol (PROVENTIL) (2.5 MG/3ML) 0.083% nebulizer solution INHALE CONTENTS OF ONE VIAL VIA NEBULIZER EVERY 4 HOURS AS NEEDED 120 mL 1  . budesonide-formoterol (SYMBICORT) 160-4.5 MCG/ACT inhaler Inhale 2 puffs into the lungs 2 (two) times daily. 1 Inhaler 0  . gabapentin (NEURONTIN) 600 MG tablet Take 1,800 mg by mouth 3 (three) times daily.     [name] levothyroxine (SYNTHROID, LEVOTHROID) 175 MCG tablet TAKE 1 TABLET BY MOUTH DAILY BEFORE BREAKFAST. 90 tablet 3  . sertraline (ZOLOFT) 100 MG tablet Take 100 mg by mouth daily.    . tizanidine (ZANAFLEX) 2 MG capsule Take 2 mg by mouth 3 times/day as needed-between meals & bedtime. Reported on 12/11/2015    . albuterol (PROVENTIL HFA;VENTOLIN HFA) 108 (90 Base) MCG/ACT inhaler Inhale 1-2 puffs every 4 (four) hours as needed into the lungs for wheezing or shortness of breath. 6 Inhaler 1  . buPROPion (WELLBUTRIN SR) 200 MG 12 hr tablet Take 200 mg by mouth 2 (two) times daily.    [name] Spacer/Aero Chamber Mouthpiece MISC 1 Device as directed by Does not apply route. (Patient not taking: Reported on 08/28/2018) 1 each 0   No facility-administered medications prior to visit.      Review of Systems  Constitutional:   No  weight loss, night sweats,  Fevers, chills, fatigue, or  lassitude.  HEENT:   No headaches,  Difficulty swallowing,  Tooth/dental problems, or  Sore throat,                No sneezing, itching, ear ache,  +nasal congestion, post nasal drip,   CV:  No chest pain,  Orthopnea, PND, swelling in lower extremities, anasarca, dizziness, palpitations, syncope.   GI  No heartburn, indigestion, abdominal pain, nausea, vomiting, diarrhea, change in bowel habits, loss of appetite, bloody stools.   Resp:   No chest wall deformity  Skin: no rash or lesions.  GU: no dysuria, change in color of urine, no urgency or frequency.  No flank pain, no hematuria   MS:   No joint pain or swelling.  No decreased range of motion.  No back pain.    Physical Exam  BP 108/66 (BP Location: Left Arm, Cuff Size: Large)   Pulse 65   Ht 5\' 6"  (1.676 m)   Wt 260 lb 6.4 oz (118.1 kg)   LMP 10/24/2012   SpO2 96%   BMI 42.03 kg/m   GEN: A/Ox3; pleasant , NAD, obese   HEENT:  [place]/AT,  EACs-clear, TMs-wnl, NOSE-clear drainage tHROAT-clear, no lesions, no postnasal drip or exudate noted.   NECK:  Supple w/ fair ROM; no JVD; normal carotid impulses w/o bruits; no thyromegaly or nodules palpated; no lymphadenopathy.    RESP trace wheezes on forced exhalation no  accessory muscle use, no dullness to percussion, speaks in full sentences.  Positive cough with inspiration  CARD:  RRR, no m/r/g, no peripheral edema, pulses intact, no cyanosis or clubbing.  GI:   Soft & nt; nml bowel sounds; no organomegaly or masses detected.   Musco: Warm bil, no deformities or joint swelling noted.   Neuro: alert, no focal deficits noted.    Skin: Warm, no lesions or rashes    Lab Results:  CBC  BNP No results found for: BNP  ProBNP No results found for: PROBNP  Imaging: No results found.    PFT Results Latest Ref Rng & Units 03/31/2017 04/28/2016  FVC-Pre L 2.63 2.70  FVC-Predicted Pre % 71 73  FVC-Post L 2.65 2.96  FVC-Predicted Post % 72 80  Pre FEV1/FVC % % 78 74  Post FEV1/FCV % % 81 75  FEV1-Pre L 2.04 2.00  FEV1-Predicted Pre % 71 69  FEV1-Post L 2.13 2.22  DLCO UNC% % 95 104  DLCO COR %Predicted % 107 112  TLC L - 5.11  TLC % Predicted % - 95  RV % Predicted % - 113    Lab Results  Component Value Date   NITRICOXIDE <5 08/28/2018        Assessment & Plan:   Asthma Mild exacerbation with suspected rhinitis flare Spirometry and exhaled nitric oxide testing okay  Give a short prednisone taper to have on hold if symptoms do not improve. Patient education given  Plan  Patient Instructions  Continue on Symbicort 2 puffs Twice daily.  Rinse after use.  Albuterol As needed  Wheezing .  Begin Zyrtec 10mg  At bedtime  For 2 weeks then daily As needed   Begin Flonase Nasal 2 puffs daily for 2 weeks then daily As needed   Prednisone taper to have on hold if symptoms do not improve and wheezing/cough continue  Follow up with Dr. Vaughan Browner in 3-4 months and As needed   Please contact office for sooner follow up if symptoms do not improve or worsen or seek emergency care       Restrictive lung disease Spirometry is stable.  Chest x-ray earlier this year stable.  Acute rhinitis Mild flare Add Flonase and Zyrtec     Stephenson Cichy, NP 08/28/2018

## 2018-09-12 ENCOUNTER — Other Ambulatory Visit: Payer: Self-pay | Admitting: Acute Care

## 2018-10-02 ENCOUNTER — Ambulatory Visit (HOSPITAL_COMMUNITY): Payer: PPO

## 2018-10-02 ENCOUNTER — Other Ambulatory Visit (HOSPITAL_COMMUNITY): Payer: Self-pay | Admitting: Family Medicine

## 2018-10-02 DIAGNOSIS — Z1231 Encounter for screening mammogram for malignant neoplasm of breast: Secondary | ICD-10-CM

## 2018-10-09 DIAGNOSIS — M79672 Pain in left foot: Secondary | ICD-10-CM | POA: Diagnosis not present

## 2018-10-09 DIAGNOSIS — S9030XA Contusion of unspecified foot, initial encounter: Secondary | ICD-10-CM | POA: Diagnosis not present

## 2018-10-09 DIAGNOSIS — S92332A Displaced fracture of third metatarsal bone, left foot, initial encounter for closed fracture: Secondary | ICD-10-CM | POA: Diagnosis not present

## 2018-10-16 ENCOUNTER — Ambulatory Visit (HOSPITAL_COMMUNITY): Payer: PPO

## 2018-10-20 DIAGNOSIS — G894 Chronic pain syndrome: Secondary | ICD-10-CM | POA: Diagnosis not present

## 2018-10-20 DIAGNOSIS — M961 Postlaminectomy syndrome, not elsewhere classified: Secondary | ICD-10-CM | POA: Diagnosis not present

## 2018-10-20 DIAGNOSIS — M170 Bilateral primary osteoarthritis of knee: Secondary | ICD-10-CM | POA: Diagnosis not present

## 2018-10-30 ENCOUNTER — Other Ambulatory Visit: Payer: Self-pay | Admitting: Adult Health

## 2018-11-06 ENCOUNTER — Other Ambulatory Visit: Payer: Self-pay | Admitting: Acute Care

## 2018-11-09 DIAGNOSIS — R262 Difficulty in walking, not elsewhere classified: Secondary | ICD-10-CM | POA: Diagnosis not present

## 2018-11-09 DIAGNOSIS — M5442 Lumbago with sciatica, left side: Secondary | ICD-10-CM | POA: Diagnosis not present

## 2018-11-09 DIAGNOSIS — Z7409 Other reduced mobility: Secondary | ICD-10-CM | POA: Diagnosis not present

## 2018-11-09 DIAGNOSIS — G8929 Other chronic pain: Secondary | ICD-10-CM | POA: Diagnosis not present

## 2018-11-09 DIAGNOSIS — R531 Weakness: Secondary | ICD-10-CM | POA: Diagnosis not present

## 2018-11-09 DIAGNOSIS — M5441 Lumbago with sciatica, right side: Secondary | ICD-10-CM | POA: Diagnosis not present

## 2018-11-15 DIAGNOSIS — J441 Chronic obstructive pulmonary disease with (acute) exacerbation: Secondary | ICD-10-CM | POA: Diagnosis not present

## 2018-11-15 DIAGNOSIS — J9801 Acute bronchospasm: Secondary | ICD-10-CM | POA: Diagnosis not present

## 2018-11-22 DIAGNOSIS — M961 Postlaminectomy syndrome, not elsewhere classified: Secondary | ICD-10-CM | POA: Diagnosis not present

## 2018-11-22 DIAGNOSIS — M25551 Pain in right hip: Secondary | ICD-10-CM | POA: Diagnosis not present

## 2018-12-15 ENCOUNTER — Telehealth: Payer: Self-pay | Admitting: Pulmonary Disease

## 2018-12-15 MED ORDER — BUDESONIDE-FORMOTEROL FUMARATE 160-4.5 MCG/ACT IN AERO: INHALATION_SPRAY | RESPIRATORY_TRACT | 5 refills | Status: DC

## 2018-12-15 NOTE — Telephone Encounter (Signed)
Symbicort Rx has been sent to pt's preferred pharmacy. Attempted to call pt to let her know this had been done but unable to reach her.left detailed message on pt's machine letting her know this was taken care of for her. Nothing further needed.

## 2018-12-28 ENCOUNTER — Ambulatory Visit: Payer: PPO | Admitting: Pulmonary Disease

## 2019-01-17 DIAGNOSIS — M170 Bilateral primary osteoarthritis of knee: Secondary | ICD-10-CM | POA: Diagnosis not present

## 2019-01-17 DIAGNOSIS — M961 Postlaminectomy syndrome, not elsewhere classified: Secondary | ICD-10-CM | POA: Diagnosis not present

## 2019-02-01 ENCOUNTER — Other Ambulatory Visit: Payer: Self-pay | Admitting: Acute Care

## 2019-02-27 DIAGNOSIS — K2900 Acute gastritis without bleeding: Secondary | ICD-10-CM | POA: Diagnosis not present

## 2019-03-14 DIAGNOSIS — M961 Postlaminectomy syndrome, not elsewhere classified: Secondary | ICD-10-CM | POA: Diagnosis not present

## 2019-03-14 DIAGNOSIS — M170 Bilateral primary osteoarthritis of knee: Secondary | ICD-10-CM | POA: Diagnosis not present

## 2019-04-04 ENCOUNTER — Telehealth: Payer: Self-pay

## 2019-04-04 NOTE — Telephone Encounter (Signed)
lmom to call and set up appt with Dr Holly Bodily - Per [facility] she has OMW gap we need to clear by 7/25 - lm for patient to call me at [facility]

## 2019-04-09 DIAGNOSIS — F418 Other specified anxiety disorders: Secondary | ICD-10-CM | POA: Diagnosis not present

## 2019-04-09 DIAGNOSIS — J4520 Mild intermittent asthma, uncomplicated: Secondary | ICD-10-CM | POA: Diagnosis not present

## 2019-04-09 DIAGNOSIS — E039 Hypothyroidism, unspecified: Secondary | ICD-10-CM | POA: Diagnosis not present

## 2019-04-09 DIAGNOSIS — J441 Chronic obstructive pulmonary disease with (acute) exacerbation: Secondary | ICD-10-CM | POA: Diagnosis not present

## 2019-04-09 DIAGNOSIS — J45901 Unspecified asthma with (acute) exacerbation: Secondary | ICD-10-CM | POA: Diagnosis not present

## 2019-04-18 ENCOUNTER — Other Ambulatory Visit: Payer: Self-pay | Admitting: Adult Health

## 2019-04-25 ENCOUNTER — Encounter: Payer: Self-pay | Admitting: Pulmonary Disease

## 2019-04-25 ENCOUNTER — Ambulatory Visit (INDEPENDENT_AMBULATORY_CARE_PROVIDER_SITE_OTHER): Payer: PPO | Admitting: Pulmonary Disease

## 2019-04-25 ENCOUNTER — Other Ambulatory Visit: Payer: Self-pay

## 2019-04-25 DIAGNOSIS — J4540 Moderate persistent asthma, uncomplicated: Secondary | ICD-10-CM

## 2019-04-25 NOTE — Progress Notes (Signed)
[name] [name]    [id number]    1960-12-13  Primary Care Physician:Patient, No Pcp Per  Referring Physician: No referring provider defined for this encounter.  Chief complaint: Follow-up for asthma  HPI: 57 year old with history of moderate persistent asthma, mild restrictive lung disease, VATS decortication in January 2017 for empyema. Here for follow-up of asthma.  She is on Symbicort and albuterol.  States that her breathing is doing well with no issues.  Hardly needs to use her rescue inhaler  Pets: Has 2 cats, dogs, outdoor chicken Occupation: Was in the [employer] as a Psychologist, forensic and worked as an Engineer, technical sales Exposures: No known exposures.  No mold, has an outdoor hot tub which she cleans regularly. Smoking history: 12-pack-year smoker.  Quit in 2005 Travel history: Originally from [place] at [place], [place]. Previously has lived in [place], [place], [place], [place], [place], [place], [place]. She moved to [place] in 1995. Relevant family history: No significant family history of lung disease  Outpatient Encounter Medications as of 04/25/2019  Medication Sig  . albuterol (PROVENTIL HFA;VENTOLIN HFA) 108 (90 Base) MCG/ACT inhaler Inhale 1-2 puffs into the lungs every 4 (four) hours as needed for wheezing or shortness of breath.  [name] albuterol (PROVENTIL) (2.5 MG/3ML) 0.083% nebulizer solution INHALE THE CONTENTS OF ONE VIAL EVERY 4 HOURS AS NEEDED  . budesonide-formoterol (SYMBICORT) 160-4.5 MCG/ACT inhaler INHALE TWO PUFFS INTO THE LUNGS TWICE DAILY  . gabapentin (NEURONTIN) 600 MG tablet Take 600 mg by mouth daily.   [name] levothyroxine (SYNTHROID, LEVOTHROID) 175 MCG tablet TAKE 1 TABLET BY MOUTH DAILY BEFORE BREAKFAST.  [name] morphine (MS CONTIN) 15 MG 12 hr tablet Take 15 mg by mouth 2 (two) times daily.  [name] morphine (MSIR) 15 MG tablet TAKE 1 TABLET BY MOUTH DAILY AS NEEDED FOR UP TO 30 DAYS  . sertraline (ZOLOFT) 100 MG tablet Take 100 mg by mouth daily.  [name] Spacer/Aero Chamber  Mouthpiece MISC 1 Device as directed by Does not apply route.  . tizanidine (ZANAFLEX) 2 MG capsule Take 2 mg by mouth 3 times/day as needed-between meals & bedtime. Reported on 12/11/2015  . buPROPion (WELLBUTRIN SR) 200 MG 12 hr tablet Take 200 mg by mouth 2 (two) times daily.  . [DISCONTINUED] predniSONE (DELTASONE) 10 MG tablet 4 tabs for 2 days, then 3 tabs for 2 days, 2 tabs for 2 days, then 1 tab for 2 days, then stop   No facility-administered encounter medications on file as of 04/25/2019.     Allergies as of 04/25/2019 - Review Complete 04/25/2019  Allergen Reaction Noted  . Celebrex [celecoxib] Nausea And Vomiting 03/15/2015  . Nsaids Other (See Comments) 03/15/2015  . Percocet [oxycodone-acetaminophen] Itching 03/15/2015    Past Medical History:  Diagnosis Date  . Allergy   . Anxiety   . Arthritis    low back  . Asthma   . Carpal tunnel syndrome 01/13/2015   Bilateral  . Depression   . FIBROIDS, UTERUS 10/17/2006   Qualifier: Diagnosis of  By: Jonna Munro MD, Roderic Scarce    . Hyperlipidemia   . Nerve damage    right leg  . Neuromuscular disorder (HCC)    fibromyalgia  . 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 Thyroid disease    hypo thyroid    Past Surgical History:  Procedure Laterality Date  . ANTERIOR CERVICAL DECOMP/DISCECTOMY FUSION     L4, L5   . SHOULDER ARTHROSCOPY WITH ROTATOR CUFF REPAIR AND  SUBACROMIAL DECOMPRESSION    . TONSILLECTOMY    . VIDEO ASSISTED THORACOSCOPY (VATS)/DECORTICATION Right 09/24/2015   Procedure: VIDEO ASSISTED THORACOSCOPY (VATS)/DECORTICATION;  Surgeon: Melrose Nakayama, MD;  Location: [facility];  Service: Thoracic;  Laterality: Right;  [name] VIDEO BRONCHOSCOPY N/A 09/24/2015   Procedure: VIDEO BRONCHOSCOPY;  Surgeon: Melrose Nakayama, MD;  Location: [facility] OR;  Service: Thoracic;  Laterality: N/A;    Family History  Problem Relation Age of Onset  . Bone cancer Father   . Asthma Father   . Diabetes Father   . Alcohol abuse  Father   . Cancer Father   . COPD Father   . Heart disease Father   . Hypertension Father   . Hyperlipidemia Father   . Pneumonia Mother        biological mother deceased at age 62  . Depression Mother   . Alcohol abuse Sister   . Alcohol abuse Brother   . Alcohol abuse Sister   . Alcohol abuse Brother   . Colon cancer Neg Hx   . Esophageal cancer Neg Hx   . Stomach cancer Neg Hx   . Rectal cancer Neg Hx     Social History   Socioeconomic History  . Marital status: Significant Other    Spouse name: Dianna  . Number of children: Not on file  . Years of education: 46  . Highest education level: Not on file  Occupational History  . Occupation: Disabled    Comment: Electrical engineer  Social Needs  . Financial resource strain: Not on file  . Food insecurity    Worry: Not on file    Inability: Not on file  . Transportation needs    Medical: Not on file    Non-medical: Not on file  Tobacco Use  . Smoking status: Former Smoker    Packs/day: 1.00    Types: E-cigarettes, Cigarettes    Start date: 04/16/1971    Quit date: 09/14/2007    Years since quitting: 11.6  . Smokeless tobacco: Former Systems developer    Types: Snuff, Sarina Ser    Quit date: 09/14/2007  Substance and Sexual Activity  . Alcohol use: No    Alcohol/week: 0.0 standard drinks    Comment: Quit 11/11/1989  . Drug use: No    Types: "Crack" cocaine, Marijuana    Comment: Quit in 1991  . Sexual activity: Yes    Birth control/protection: Post-menopausal  Lifestyle  . Physical activity    Days per week: Not on file    Minutes per session: Not on file  . Stress: Not on file  Relationships  . Social Herbalist on phone: Not on file    Gets together: Not on file    Attends religious service: Not on file    Active member of club or organization: Not on file    Attends meetings of clubs or organizations: Not on file    Relationship status: Not on file  . Intimate partner violence    Fear of current or ex partner:  Not on file    Emotionally abused: Not on file    Physically abused: Not on file    Forced sexual activity: Not on file  Other Topics Concern  . Not on file  Social History Narrative   Originally from [place], [place]. Previously has lived in [place], [place], [place], [place], [place], [place], [place]. She moved to [place] in 1995. 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 Currently has dogs and a cat and chickens . She has an outdoor hot tub that they use regularly. Previously had mold & mildew in their home for approximately 1 year.    Lives with [place]   [place]ives on acres    Review of systems: Review of Systems  Constitutional: Negative for fever and chills.  HENT: Negative.   Eyes: Negative for blurred vision.  Respiratory: as per HPI  Cardiovascular: Negative for chest pain and palpitations.  Gastrointestinal: Negative for vomiting, diarrhea, blood per rectum. Genitourinary: Negative for dysuria, urgency, frequency and hematuria.  Musculoskeletal: Negative for myalgias, back pain and joint pain.  Skin: Negative for itching and rash.  Neurological: Negative for dizziness, tremors, focal weakness, seizures and loss of consciousness.  Endo/Heme/Allergies: Negative for environmental allergies.  Psychiatric/Behavioral: Negative for depression, suicidal ideas and hallucinations.  All other systems reviewed and are negative.  Physical Exam: Last menstrual period 10/24/2012. Gen:      No acute distress HEENT:  EOMI, sclera anicteric Neck:     No masses; no thyromegaly Lungs:    Clear to auscultation bilaterally; normal respiratory effort CV:         Regular rate and rhythm; no murmurs Abd:      + bowel sounds; soft, non-tender; no palpable masses, no distension Ext:    No edema; adequate peripheral perfusion Skin:      Warm and dry; no rash Neuro: alert and oriented x 3 Psych: normal mood and affect  Data Reviewed: Imaging:  CT chest 09/21/2015-right pleural effusion with right lower lobe consolidation, right middle lobe consolidation.  Chest x-ray 09/21/2017-mild interstitial prominence, peribronchial thickening I have reviewed the images personally.  PFTs: 03/31/17: FVC 2.63 L (71%) FEV1 2.04 L (71%) FEV1/FVC 0.78 FEF 25-75 1.65 L (62%) negative bronchodilator response TLC 3.91 L (73%) RV 68% ERV 27% DLCO uncorrected 95% 04/28/16: FVC 2.70 L (73%) FEV1 2.00 L (69%) FEV1/FVC 0.74 FEF 25-75 1.14 L (53%) negative bronchodilator response TLC 5.11 L (95%) RV 113% ERV 7% DLCO corrected 97% (hgb 15.7)  6MWT 04/28/16: Walked 452 meters / Baseline Sat 100% on RA / Nadir Sat 94% on RA  ACQ 04/25/19- 1.6  Labs: Right Pleural Fluid (09/24/15): No malignant cells. Right Pleural Fluid (09/22/15): No malignant cells. Right Pleural Peel (09/22/15): No atypia or malignancy identified. Benign organizing fibrin inflammatory debris.  Micro Right Pleura Visceral & Parietal (09/24/15): Negative Right Pleural Fluid (09/24/15): Negative  Right Pleural Fluid (09/24/15): Negative  Right Bronchial Wash (09/24/15): Negative  Right Pleural Fluid (09/22/15): Negative   PFA (09/22/15) Glucose: <20 LDH: 3339 Total protein: 5.2 (serum 6.7) PH: 7.4 WBC: 7830 (neutro 36%, monocytes 53%, lymphs 11%)  Assessment:  Moderate persistent asthma Stable on Symbicort and albuterol as needed Continue current therapy.  Health maintenance 11/13/2015- Prevnar 04/27/2018-Pneumovax  Plan/Recommendations: - Continue inhalers  Marshell Garfinkel MD [facility] 04/25/2019, 10:58 AM  CC: No ref. provider found

## 2019-04-25 NOTE — Patient Instructions (Signed)
I am glad you are doing well with regard to your breathing Continue the Symbicort and albuterol as needed Follow-up in 1 year.  Please give [place] a call sooner if there is any change in his symptoms.

## 2019-05-16 DIAGNOSIS — G894 Chronic pain syndrome: Secondary | ICD-10-CM | POA: Diagnosis not present

## 2019-05-16 DIAGNOSIS — M961 Postlaminectomy syndrome, not elsewhere classified: Secondary | ICD-10-CM | POA: Diagnosis not present

## 2019-05-16 DIAGNOSIS — Z79891 Long term (current) use of opiate analgesic: Secondary | ICD-10-CM | POA: Diagnosis not present

## 2019-05-30 ENCOUNTER — Encounter: Payer: Self-pay | Admitting: Pulmonary Disease

## 2019-05-30 ENCOUNTER — Ambulatory Visit (INDEPENDENT_AMBULATORY_CARE_PROVIDER_SITE_OTHER): Payer: PPO | Admitting: Pulmonary Disease

## 2019-05-30 ENCOUNTER — Telehealth: Payer: Self-pay | Admitting: Pulmonary Disease

## 2019-05-30 ENCOUNTER — Other Ambulatory Visit: Payer: Self-pay

## 2019-05-30 DIAGNOSIS — J4541 Moderate persistent asthma with (acute) exacerbation: Secondary | ICD-10-CM | POA: Diagnosis not present

## 2019-05-30 DIAGNOSIS — R6889 Other general symptoms and signs: Secondary | ICD-10-CM | POA: Diagnosis not present

## 2019-05-30 DIAGNOSIS — U071 COVID-19: Secondary | ICD-10-CM | POA: Diagnosis not present

## 2019-05-30 MED ORDER — PREDNISONE 20 MG PO TABS: 40.0000 mg | ORAL_TABLET | Freq: Every day | ORAL | 0 refills | Status: DC

## 2019-05-30 NOTE — Progress Notes (Signed)
Virtual Visit via Telephone Note  I connected with [name] [name] on 05/30/19 at 10:30 AM EDT by telephone and verified that I am speaking with the correct person using two identifiers.  Location: Patient: Home Provider: [facility], [place], [place]   I discussed the limitations, risks, security and privacy concerns of performing an evaluation and management service by telephone and the availability of in person appointments. I also discussed with the patient that there may be a patient responsible charge related to this service. The patient expressed understanding and agreed to proceed.   History of Present Illness: Chief complaint:  Acute tele-visit for dyspnea  HPI: 58 year old with history of moderate persistent asthma, mild restrictive lung disease, VATS decortication in January 2017 for empyema.  Maintained on Symbicort and albuterol  Complains of increasing cough, nonproductive for the past 1 week.  She has runny nose, nasal congestion which she attributes to allergies Has increased dyspnea.  No wheeze.  Reports low-grade fevers up to 100.5 No sick contacts but she does go to the flea markets on the weekends.  Has been wearing a mask regularly.   Observations/Objective: Moderate persistent asthma with exacerbation Concern for COVID-19 given low-grade fevers  Assessment and Plan: - Continue Symbicort, albuterol - Prednisone 40 mg a day for 5 days - Continue Xanax, Claritin for allergic rhinitis - Sent for COVID-19 swab  Follow Up Instructions: Back if no improvement in symptoms Follow-up in 3 months  I discussed the assessment and treatment plan with the patient. The patient was provided an opportunity to ask questions and all were answered. The patient agreed with the plan and demonstrated an understanding of the instructions.   The patient was advised to call back or seek an in-person evaluation if the symptoms worsen or if the condition fails  to improve as anticipated.  I provided 25 minutes of non-face-to-face time during this encounter.   Marshell Garfinkel MD [facility] 05/30/2019, 10:39 AM

## 2019-05-30 NOTE — Patient Instructions (Signed)
I am sorry you not feeling well We will call in a prescription for prednisone 40 mg a day for 5 days Continue using your Symbicort and albuterol inhaler Use Nasonex over-the-counter and Claritin for allergic rhinitis Since you are having low-grade fevers we will get you tested for COVID-19  Follow-up in 3 months.

## 2019-05-30 NOTE — Telephone Encounter (Signed)
Spoke with the pt  She is c/o increased SOB and cough x 1 wk  She states her "lungs felt like they are burning" when weather was humid last wk  televisit with Dr. Vaughan Browner at 10:30 am today

## 2019-05-31 LAB — NOVEL CORONAVIRUS, NAA: SARS-CoV-2, NAA: NOT DETECTED

## 2019-06-07 ENCOUNTER — Other Ambulatory Visit: Payer: Self-pay | Admitting: Pulmonary Disease

## 2019-07-23 DIAGNOSIS — M722 Plantar fascial fibromatosis: Secondary | ICD-10-CM | POA: Diagnosis not present

## 2019-07-23 DIAGNOSIS — M79672 Pain in left foot: Secondary | ICD-10-CM | POA: Diagnosis not present

## 2019-07-27 DIAGNOSIS — G894 Chronic pain syndrome: Secondary | ICD-10-CM | POA: Diagnosis not present

## 2019-07-27 DIAGNOSIS — M961 Postlaminectomy syndrome, not elsewhere classified: Secondary | ICD-10-CM | POA: Diagnosis not present

## 2019-07-27 DIAGNOSIS — M5416 Radiculopathy, lumbar region: Secondary | ICD-10-CM | POA: Diagnosis not present

## 2019-09-03 DIAGNOSIS — M79672 Pain in left foot: Secondary | ICD-10-CM | POA: Diagnosis not present

## 2019-09-03 DIAGNOSIS — M722 Plantar fascial fibromatosis: Secondary | ICD-10-CM | POA: Diagnosis not present

## 2019-09-04 DIAGNOSIS — J029 Acute pharyngitis, unspecified: Secondary | ICD-10-CM | POA: Diagnosis not present

## 2019-09-25 DIAGNOSIS — M961 Postlaminectomy syndrome, not elsewhere classified: Secondary | ICD-10-CM | POA: Diagnosis not present

## 2019-09-25 DIAGNOSIS — M7918 Myalgia, other site: Secondary | ICD-10-CM | POA: Diagnosis not present

## 2019-09-25 DIAGNOSIS — G894 Chronic pain syndrome: Secondary | ICD-10-CM | POA: Diagnosis not present

## 2019-09-25 DIAGNOSIS — M5416 Radiculopathy, lumbar region: Secondary | ICD-10-CM | POA: Diagnosis not present

## 2019-11-21 ENCOUNTER — Telehealth: Payer: Self-pay | Admitting: Pulmonary Disease

## 2019-11-21 MED ORDER — BUDESONIDE-FORMOTEROL FUMARATE 160-4.5 MCG/ACT IN AERO: INHALATION_SPRAY | RESPIRATORY_TRACT | 0 refills | Status: DC

## 2019-11-21 NOTE — Telephone Encounter (Signed)
I called and made patient aware that she is due for a follow up and it can be telephone or video since she is taking care of her mother. I have sent her in a refill for her Symbicort.

## 2019-12-20 DIAGNOSIS — M170 Bilateral primary osteoarthritis of knee: Secondary | ICD-10-CM | POA: Diagnosis not present

## 2019-12-20 DIAGNOSIS — M25551 Pain in right hip: Secondary | ICD-10-CM | POA: Diagnosis not present

## 2019-12-20 DIAGNOSIS — M961 Postlaminectomy syndrome, not elsewhere classified: Secondary | ICD-10-CM | POA: Diagnosis not present

## 2019-12-20 DIAGNOSIS — M7918 Myalgia, other site: Secondary | ICD-10-CM | POA: Diagnosis not present

## 2019-12-20 DIAGNOSIS — M5416 Radiculopathy, lumbar region: Secondary | ICD-10-CM | POA: Diagnosis not present

## 2020-01-15 ENCOUNTER — Other Ambulatory Visit: Payer: Self-pay

## 2020-01-15 ENCOUNTER — Other Ambulatory Visit (INDEPENDENT_AMBULATORY_CARE_PROVIDER_SITE_OTHER): Payer: PPO

## 2020-01-15 ENCOUNTER — Encounter: Payer: Self-pay | Admitting: Pulmonary Disease

## 2020-01-15 ENCOUNTER — Ambulatory Visit: Payer: PPO | Admitting: Pulmonary Disease

## 2020-01-15 VITALS — BP 118/72 | HR 66 | Temp 97.9°F | Ht 65.5 in | Wt 250.2 lb

## 2020-01-15 DIAGNOSIS — J4541 Moderate persistent asthma with (acute) exacerbation: Secondary | ICD-10-CM | POA: Diagnosis not present

## 2020-01-15 LAB — CBC WITH DIFFERENTIAL/PLATELET
Basophils Absolute: 0.1 10*3/uL (ref 0.0–0.1)
Basophils Relative: 1.1 % (ref 0.0–3.0)
Eosinophils Absolute: 0.3 10*3/uL (ref 0.0–0.7)
Eosinophils Relative: 2.8 % (ref 0.0–5.0)
HCT: 41.3 % (ref 36.0–46.0)
Hemoglobin: 14 g/dL (ref 12.0–15.0)
Lymphocytes Relative: 30.6 % (ref 12.0–46.0)
Lymphs Abs: 2.8 10*3/uL (ref 0.7–4.0)
MCHC: 33.9 g/dL (ref 30.0–36.0)
MCV: 91.7 fl (ref 78.0–100.0)
Monocytes Absolute: 0.4 10*3/uL (ref 0.1–1.0)
Monocytes Relative: 4 % (ref 3.0–12.0)
Neutro Abs: 5.5 10*3/uL (ref 1.4–7.7)
Neutrophils Relative %: 61.5 % (ref 43.0–77.0)
Platelets: 257 10*3/uL (ref 150.0–400.0)
RBC: 4.5 Mil/uL (ref 3.87–5.11)
RDW: 12.9 % (ref 11.5–15.5)
WBC: 9 10*3/uL (ref 4.0–10.5)

## 2020-01-15 MED ORDER — BUDESONIDE-FORMOTEROL FUMARATE 160-4.5 MCG/ACT IN AERO
INHALATION_SPRAY | RESPIRATORY_TRACT | 3 refills | Status: DC
Start: 2020-01-15 — End: 2020-04-17

## 2020-01-15 NOTE — Progress Notes (Signed)
GLORIE CHESEBRO    OU:[id number]    Jan 28, 1961  Primary Care Physician:Hagler, Apolonio Schneiders, MD  Referring Physician: No referring provider defined for this encounter.  Chief complaint: Follow-up for asthma  HPI: 59 year old with history of moderate persistent asthma, mild restrictive lung disease, VATS decortication in January 2017 for empyema. Here for follow-up of asthma.  She is on Symbicort and albuterol.  States that her breathing is doing well with no issues.  Hardly needs to use her rescue inhaler  Pets: Has 2 cats, dogs, outdoor chicken Occupation: Was in the [employer] as a Psychologist, forensic and worked as an Engineer, technical sales Exposures: No known exposures.  No mold, has an outdoor hot tub which she cleans regularly. Smoking history: 12-pack-year smoker.  Quit in 2005 Travel history: Originally from [place], [place]. Previously has lived in [place], [place], [place], [place], [place], [place], [place]. She moved to [place] in 1995. Relevant family history: No significant family history of lung disease  Interim history: Patient reports she is doing well.  Continues on Symbicort twice a day She is using her albuterol 2 times a day for dyspnea.  Recently went to [place] to be with her sick mother and has started eating more due to stress and put on weight.   Outpatient Encounter Medications as of 01/15/2020  Medication Sig  . albuterol (PROVENTIL HFA;VENTOLIN HFA) 108 (90 Base) MCG/ACT inhaler Inhale 1-2 puffs into the lungs every 4 (four) hours as needed for wheezing or shortness of breath.  [name] albuterol (PROVENTIL) (2.5 MG/3ML) 0.083% nebulizer solution INHALE THE CONTENTS OF ONE VIAL EVERY 4 HOURS AS NEEDED  . budesonide-formoterol (SYMBICORT) 160-4.5 MCG/ACT inhaler INHALE TWO PUFFS INTO THE LUNGS TWICE DAILY  . gabapentin (NEURONTIN) 600 MG tablet Take 600 mg by mouth daily.   [name] levothyroxine (SYNTHROID, LEVOTHROID) 175 MCG tablet TAKE 1 TABLET BY MOUTH DAILY BEFORE BREAKFAST.  [name]  sertraline (ZOLOFT) 100 MG tablet Take 100 mg by mouth daily.  [name] Spacer/Aero Chamber Mouthpiece MISC 1 Device as directed by Does not apply route.  . tizanidine (ZANAFLEX) 2 MG capsule Take 2 mg by mouth 3 times/day as needed-between meals & bedtime. Reported on 12/11/2015  . [DISCONTINUED] budesonide-formoterol (SYMBICORT) 160-4.5 MCG/ACT inhaler INHALE TWO PUFFS INTO THE LUNGS TWICE DAILY  . [DISCONTINUED] buPROPion (WELLBUTRIN SR) 200 MG 12 hr tablet Take 200 mg by mouth 2 (two) times daily.  . [DISCONTINUED] morphine (MS CONTIN) 15 MG 12 hr tablet Take 15 mg by mouth 2 (two) times daily.  . [DISCONTINUED] morphine (MSIR) 15 MG tablet TAKE 1 TABLET BY MOUTH DAILY AS NEEDED FOR UP TO 30 DAYS  . [DISCONTINUED] predniSONE (DELTASONE) 20 MG tablet Take 2 tablets (40 mg total) by mouth daily with breakfast.   No facility-administered encounter medications on file as of 01/15/2020.    Allergies as of 01/15/2020 - Review Complete 01/15/2020  Allergen Reaction Noted  . Celebrex [celecoxib] Nausea And Vomiting 03/15/2015  . Nsaids Other (See Comments) 03/15/2015  . Percocet [oxycodone-acetaminophen] Itching 03/15/2015    Past Medical History:  Diagnosis Date  . Allergy   . Anxiety   . Arthritis    low back  . Asthma   . Carpal tunnel syndrome 01/13/2015   Bilateral  . Depression   . FIBROIDS, UTERUS 10/17/2006   Qualifier: Diagnosis of  By: Jonna Munro MD, Roderic Scarce    . Hyperlipidemia   . Nerve damage    right leg  . Neuromuscular disorder ([facility])  fibromyalgia  . 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 Thyroid disease    hypo thyroid    Past Surgical History:  Procedure Laterality Date  . ANTERIOR CERVICAL DECOMP/DISCECTOMY FUSION     L4, L5   . SHOULDER ARTHROSCOPY WITH ROTATOR CUFF REPAIR AND SUBACROMIAL DECOMPRESSION    . TONSILLECTOMY    . VIDEO ASSISTED THORACOSCOPY (VATS)/DECORTICATION Right 09/24/2015   Procedure: VIDEO ASSISTED THORACOSCOPY (VATS)/DECORTICATION;   Surgeon: Melrose Nakayama, MD;  Location: [facility];  Service: Thoracic;  Laterality: Right;  [name] VIDEO BRONCHOSCOPY N/A 09/24/2015   Procedure: VIDEO BRONCHOSCOPY;  Surgeon: Melrose Nakayama, MD;  Location: [facility];  Service: Thoracic;  Laterality: N/A;    Family History  Problem Relation Age of Onset  . Bone cancer Father   . Asthma Father   . Diabetes Father   . Alcohol abuse Father   . Cancer Father   . COPD Father   . Heart disease Father   . Hypertension Father   . Hyperlipidemia Father   . Pneumonia Mother        biological mother deceased at age 47  . Depression Mother   . Alcohol abuse Sister   . Alcohol abuse Brother   . Alcohol abuse Sister   . Alcohol abuse Brother   . Colon cancer Neg Hx   . Esophageal cancer Neg Hx   . Stomach cancer Neg Hx   . Rectal cancer Neg Hx     Social History   Socioeconomic History  . Marital status: Significant Other    Spouse name: Dianna  . Number of children: Not on file  . Years of education: 16  . Highest education level: Not on file  Occupational History  . Occupation: Disabled    Comment: Electrical engineer  Tobacco Use  . Smoking status: Former Smoker    Packs/day: 1.00    Types: E-cigarettes, Cigarettes    Start date: 04/16/1971    Quit date: 09/14/2007    Years since quitting: 12.3  . Smokeless tobacco: Former Systems developer    Types: Snuff, Sarina Ser    Quit date: 09/14/2007  Substance and Sexual Activity  . Alcohol use: No    Alcohol/week: 0.0 standard drinks    Comment: Quit 11/11/1989  . Drug use: No    Types: "Crack" cocaine, Marijuana    Comment: Quit in 1991  . Sexual activity: Yes    Birth control/protection: Post-menopausal  Other Topics Concern  . Not on file  Social History Narrative   Originally from [place], [place]. Previously has lived in [place], [place], [place], [place], [place], [place], [place]. She moved to [place] in 1995. 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 Currently has dogs and a cat and chickens . She has an outdoor hot tub that they use regularly. Previously had mold & mildew in their home for approximately 1 year.    Lives with [place]   Lives on acres   Social Determinants of Health   Financial Resource Strain:   . Difficulty of Paying Living Expenses:   Food Insecurity:   . Worried About Charity fundraiser in the Last Year:   . Arboriculturist in the Last Year:   Transportation Needs:   . Film/video editor (Medical):   [name] Lack of Transportation (Non-Medical):   Physical Activity:   . Days of Exercise per Week:   .  Minutes of Exercise per Session:   Stress:   . Feeling of Stress :   Social Connections:   . Frequency of Communication with Friends and Family:   . Frequency of Social Gatherings with Friends and Family:   . Attends Religious Services:   . Active Member of Clubs or Organizations:   . Attends Archivist Meetings:   [name] Marital Status:   Intimate Partner Violence:   . Fear of Current or Ex-Partner:   . Emotionally Abused:   [name] Physically Abused:   . Sexually Abused:     Review of systems: Review of Systems  Constitutional: Negative for fever and chills.  HENT: Negative.   Eyes: Negative for blurred vision.  Respiratory: as per HPI  Cardiovascular: Negative for chest pain and palpitations.  Gastrointestinal: Negative for vomiting, diarrhea, blood per rectum. Genitourinary: Negative for dysuria, urgency, frequency and hematuria.  Musculoskeletal: Negative for myalgias, back pain and joint pain.  Skin: Negative for itching and rash.  Neurological: Negative for dizziness, tremors, focal weakness, seizures and loss of consciousness.  Endo/Heme/Allergies: Negative for environmental allergies.  Psychiatric/Behavioral: Negative for depression, suicidal ideas and hallucinations.  All other systems reviewed and are negative.  Physical Exam: Blood pressure 118/72, pulse 66,  temperature 97.9 F (36.6 C), temperature source Temporal, height 5' 5.5" (1.664 m), weight 250 lb 3.2 oz (113.5 kg), last menstrual period 10/24/2012, SpO2 98 %. Gen:      No acute distress HEENT:  EOMI, sclera anicteric Neck:     No masses; no thyromegaly Lungs:    Clear to auscultation bilaterally; normal respiratory effort CV:         Regular rate and rhythm; no murmurs Abd:      + bowel sounds; soft, non-tender; no palpable masses, no distension Ext:    No edema; adequate peripheral perfusion Skin:      Warm and dry; no rash Neuro: alert and oriented x 3 Psych: normal mood and affect  Data Reviewed: Imaging: CT chest 09/21/2015-right pleural effusion with right lower lobe consolidation, right middle lobe consolidation.  Chest x-ray 09/21/2017-mild interstitial prominence, peribronchial thickening I have reviewed the images personally.  PFTs: 03/31/17: FVC 2.63 L (71%) FEV1 2.04 L (71%) FEV1/FVC 0.78 FEF 25-75 1.65 L (62%) negative bronchodilator response TLC 3.91 L (73%) RV 68% ERV 27% DLCO uncorrected 95% 04/28/16: FVC 2.70 L (73%) FEV1 2.00 L (69%) FEV1/FVC 0.74 FEF 25-75 1.14 L (53%) negative bronchodilator response TLC 5.11 L (95%) RV 113% ERV 7% DLCO corrected 97% (hgb 15.7)  6MWT 04/28/16: Walked 452 meters / Baseline Sat 100% on RA / Nadir Sat 94% on RA  ACQ 04/25/19- 1.6  Labs: Right Pleural Fluid (09/24/15): No malignant cells. Right Pleural Fluid (09/22/15): No malignant cells. Right Pleural Peel (09/22/15): No atypia or malignancy identified. Benign organizing fibrin inflammatory debris.  Micro Right Pleura Visceral & Parietal (09/24/15): Negative Right Pleural Fluid (09/24/15): Negative  Right Pleural Fluid (09/24/15): Negative  Right Bronchial Wash (09/24/15): Negative  Right Pleural Fluid (09/22/15): Negative   PFA (09/22/15) Glucose: <20 LDH: 3339 Total protein: 5.2 (serum 6.7) PH: 7.4 WBC: 7830 (neutro 36%, monocytes 53%, lymphs 11%)  Assessment:   Moderate persistent asthma On Symbicort. Patient states that her breathing is stable and asthma is well controlled but is still using albuterol twice daily with  ACT score of 13.  She may be minimizing her symptoms and is having persistent airway inflammation. Discussed escalation of therapy with biologics but she wants to lose some  weight first and then decide In the meantime we will get CBC differential, IgE and repeat pulmonary function test. Reassess in 6 months.  Health maintenance 11/13/2015- Prevnar 04/27/2018-Pneumovax  Plan/Recommendations: - Continue Symbicort - Reassess with CBC, IgE, PFTs.  Marshell Garfinkel MD [facility] 01/15/2020, 12:07 PM  CC: No ref. provider found

## 2020-01-15 NOTE — Patient Instructions (Signed)
Check CBC differential, IgE Schedule pulmonary function test in 6 months Follow-up in clinic in 6 months time.

## 2020-01-15 NOTE — Progress Notes (Deleted)
[name] [name]    [id number]    01-Jul-1961  Primary Care Physician:Hagler, Apolonio Schneiders, MD  Referring Physician: No referring provider defined for this encounter.  Chief complaint: Follow-up for asthma  HPI: 59 year old with history of moderate persistent asthma, mild restrictive lung disease, VATS decortication in January 2017 for empyema. Here for follow-up of asthma.  She is on Symbicort and albuterol.  States that her breathing is doing well with no issues.  Hardly needs to use her rescue inhaler  Pets: Has 2 cats, dogs, outdoor chicken Occupation: Was in the [employer] as a Psychologist, forensic and worked as an Engineer, technical sales Exposures: No known exposures.  No mold, has an outdoor hot tub which she cleans regularly. Smoking history: 12-pack-year smoker.  Quit in 2005 Travel history: Originally from [place], [place]. Previously has lived in [place], [place], [place], [place], [place], [place], [place]. She moved to [place] in 1995. Relevant family history: No significant family history of lung disease  Outpatient Encounter Medications as of 01/15/2020  Medication Sig  . albuterol (PROVENTIL HFA;VENTOLIN HFA) 108 (90 Base) MCG/ACT inhaler Inhale 1-2 puffs into the lungs every 4 (four) hours as needed for wheezing or shortness of breath.  [name] albuterol (PROVENTIL) (2.5 MG/3ML) 0.083% nebulizer solution INHALE THE CONTENTS OF ONE VIAL EVERY 4 HOURS AS NEEDED  . budesonide-formoterol (SYMBICORT) 160-4.5 MCG/ACT inhaler INHALE TWO PUFFS INTO THE LUNGS TWICE DAILY  . gabapentin (NEURONTIN) 600 MG tablet Take 600 mg by mouth daily.   [name] levothyroxine (SYNTHROID, LEVOTHROID) 175 MCG tablet TAKE 1 TABLET BY MOUTH DAILY BEFORE BREAKFAST.  [name] sertraline (ZOLOFT) 100 MG tablet Take 100 mg by mouth daily.  [name] Spacer/Aero Chamber Mouthpiece MISC 1 Device as directed by Does not apply route.  . tizanidine (ZANAFLEX) 2 MG capsule Take 2 mg by mouth 3 times/day as needed-between meals & bedtime. Reported on  12/11/2015  . [DISCONTINUED] buPROPion (WELLBUTRIN SR) 200 MG 12 hr tablet Take 200 mg by mouth 2 (two) times daily.  . [DISCONTINUED] morphine (MS CONTIN) 15 MG 12 hr tablet Take 15 mg by mouth 2 (two) times daily.  . [DISCONTINUED] morphine (MSIR) 15 MG tablet TAKE 1 TABLET BY MOUTH DAILY AS NEEDED FOR UP TO 30 DAYS  . [DISCONTINUED] predniSONE (DELTASONE) 20 MG tablet Take 2 tablets (40 mg total) by mouth daily with breakfast.   No facility-administered encounter medications on file as of 01/15/2020.    Allergies as of 01/15/2020 - Review Complete 01/15/2020  Allergen Reaction Noted  . Celebrex [celecoxib] Nausea And Vomiting 03/15/2015  . Nsaids Other (See Comments) 03/15/2015  . Percocet [oxycodone-acetaminophen] Itching 03/15/2015    Past Medical History:  Diagnosis Date  . Allergy   . Anxiety   . Arthritis    low back  . Asthma   . Carpal tunnel syndrome 01/13/2015   Bilateral  . Depression   . FIBROIDS, UTERUS 10/17/2006   Qualifier: Diagnosis of  By: Jonna Munro MD, Roderic Scarce    . Hyperlipidemia   . Nerve damage    right leg  . Neuromuscular disorder ([facility])    fibromyalgia  . 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 Thyroid disease    hypo thyroid    Past Surgical History:  Procedure Laterality Date  . ANTERIOR CERVICAL DECOMP/DISCECTOMY FUSION     L4, L5   . SHOULDER ARTHROSCOPY WITH ROTATOR CUFF REPAIR AND SUBACROMIAL DECOMPRESSION    . TONSILLECTOMY    . VIDEO  ASSISTED THORACOSCOPY (VATS)/DECORTICATION Right 09/24/2015   Procedure: VIDEO ASSISTED THORACOSCOPY (VATS)/DECORTICATION;  Surgeon: Melrose Nakayama, MD;  Location: [facility];  Service: Thoracic;  Laterality: Right;  [name] VIDEO BRONCHOSCOPY N/A 09/24/2015   Procedure: VIDEO BRONCHOSCOPY;  Surgeon: Melrose Nakayama, MD;  Location: [facility] OR;  Service: Thoracic;  Laterality: N/A;    Family History  Problem Relation Age of Onset  . Bone cancer Father   . Asthma Father   . Diabetes Father   . Alcohol  abuse Father   . Cancer Father   . COPD Father   . Heart disease Father   . Hypertension Father   . Hyperlipidemia Father   . Pneumonia Mother        biological mother deceased at age 22  . Depression Mother   . Alcohol abuse Sister   . Alcohol abuse Brother   . Alcohol abuse Sister   . Alcohol abuse Brother   . Colon cancer Neg Hx   . Esophageal cancer Neg Hx   . Stomach cancer Neg Hx   . Rectal cancer Neg Hx     Social History   Socioeconomic History  . Marital status: Significant Other    Spouse name: Dianna  . Number of children: Not on file  . Years of education: 40  . Highest education level: Not on file  Occupational History  . Occupation: Disabled    Comment: Electrical engineer  Tobacco Use  . Smoking status: Former Smoker    Packs/day: 1.00    Types: E-cigarettes, Cigarettes    Start date: 04/16/1971    Quit date: 09/14/2007    Years since quitting: 12.3  . Smokeless tobacco: Former Systems developer    Types: Snuff, Sarina Ser    Quit date: 09/14/2007  Substance and Sexual Activity  . Alcohol use: No    Alcohol/week: 0.0 standard drinks    Comment: Quit 11/11/1989  . Drug use: No    Types: "Crack" cocaine, Marijuana    Comment: Quit in 1991  . Sexual activity: Yes    Birth control/protection: Post-menopausal  Other Topics Concern  . Not on file  Social History Narrative   Originally from [place], [place]. Previously has lived in [place], [place], [place], [place], [place], [place], [place]. She moved to [place] in 1995. 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 Currently has dogs and a cat and chickens . She has an outdoor hot tub that they use regularly. Previously had mold & mildew in their home for approximately 1 year.    Lives with [place]   Lives on acres   Social Determinants of Health   Financial Resource Strain:   . Difficulty of Paying Living Expenses:   Food Insecurity:   . Worried About Charity fundraiser  in the Last Year:   . Arboriculturist in the Last Year:   Transportation Needs:   . Film/video editor (Medical):   [name] Lack of Transportation (Non-Medical):   Physical Activity:   . Days of Exercise per Week:   . Minutes of Exercise per Session:   Stress:   . Feeling of Stress :   Social Connections:   . Frequency of Communication with Friends and Family:   . Frequency of Social Gatherings with Friends and Family:   . Attends Religious Services:   . Active Member of Clubs or Organizations:   . Attends Archivist Meetings:   .  Marital Status:   Intimate Partner Violence:   . Fear of Current or Ex-Partner:   . Emotionally Abused:   [name] Physically Abused:   . Sexually Abused:     Review of systems: Review of Systems  Constitutional: Negative for fever and chills.  HENT: Negative.   Eyes: Negative for blurred vision.  Respiratory: as per HPI  Cardiovascular: Negative for chest pain and palpitations.  Gastrointestinal: Negative for vomiting, diarrhea, blood per rectum. Genitourinary: Negative for dysuria, urgency, frequency and hematuria.  Musculoskeletal: Negative for myalgias, back pain and joint pain.  Skin: Negative for itching and rash.  Neurological: Negative for dizziness, tremors, focal weakness, seizures and loss of consciousness.  Endo/Heme/Allergies: Negative for environmental allergies.  Psychiatric/Behavioral: Negative for depression, suicidal ideas and hallucinations.  All other systems reviewed and are negative.  Physical Exam: Last menstrual period 10/24/2012. Gen:      No acute distress HEENT:  EOMI, sclera anicteric Neck:     No masses; no thyromegaly Lungs:    Clear to auscultation bilaterally; normal respiratory effort CV:         Regular rate and rhythm; no murmurs Abd:      + bowel sounds; soft, non-tender; no palpable masses, no distension Ext:    No edema; adequate peripheral perfusion Skin:      Warm and dry; no rash Neuro: alert and  oriented x 3 Psych: normal mood and affect  Data Reviewed: Imaging: CT chest 09/21/2015-right pleural effusion with right lower lobe consolidation, right middle lobe consolidation.  Chest x-ray 09/21/2017-mild interstitial prominence, peribronchial thickening I have reviewed the images personally.  PFTs: 03/31/17: FVC 2.63 L (71%) FEV1 2.04 L (71%) FEV1/FVC 0.78 FEF 25-75 1.65 L (62%) negative bronchodilator response TLC 3.91 L (73%) RV 68% ERV 27% DLCO uncorrected 95% 04/28/16: FVC 2.70 L (73%) FEV1 2.00 L (69%) FEV1/FVC 0.74 FEF 25-75 1.14 L (53%) negative bronchodilator response TLC 5.11 L (95%) RV 113% ERV 7% DLCO corrected 97% (hgb 15.7)  6MWT 04/28/16: Walked 452 meters / Baseline Sat 100% on RA / Nadir Sat 94% on RA  ACQ 04/25/19- 1.6  Labs: Right Pleural Fluid (09/24/15): No malignant cells. Right Pleural Fluid (09/22/15): No malignant cells. Right Pleural Peel (09/22/15): No atypia or malignancy identified. Benign organizing fibrin inflammatory debris.  Micro Right Pleura Visceral & Parietal (09/24/15): Negative Right Pleural Fluid (09/24/15): Negative  Right Pleural Fluid (09/24/15): Negative  Right Bronchial Wash (09/24/15): Negative  Right Pleural Fluid (09/22/15): Negative   PFA (09/22/15) Glucose: <20 LDH: 3339 Total protein: 5.2 (serum 6.7) PH: 7.4 WBC: 7830 (neutro 36%, monocytes 53%, lymphs 11%)  Assessment:  Moderate persistent asthma Stable on Symbicort and albuterol as needed Continue current therapy.  Health maintenance 11/13/2015- Prevnar 04/27/2018-Pneumovax  Plan/Recommendations: - Continue inhalers  Marshell Garfinkel MD [facility] 01/15/2020, 12:04 PM  CC: No ref. provider found

## 2020-01-16 ENCOUNTER — Other Ambulatory Visit: Payer: Self-pay | Admitting: Pulmonary Disease

## 2020-01-16 LAB — IGE: IgE (Immunoglobulin E), Serum: 46 kU/L (ref <=114)

## 2020-01-23 ENCOUNTER — Telehealth: Payer: Self-pay | Admitting: Pulmonary Disease

## 2020-01-23 MED ORDER — ALBUTEROL SULFATE HFA 108 (90 BASE) MCG/ACT IN AERS: 1.0000 | INHALATION_SPRAY | RESPIRATORY_TRACT | 3 refills | Status: DC | PRN

## 2020-01-23 MED ORDER — ALBUTEROL SULFATE (2.5 MG/3ML) 0.083% IN NEBU
INHALATION_SOLUTION | RESPIRATORY_TRACT | 11 refills | Status: DC
Start: 2020-01-23 — End: 2021-03-18

## 2020-01-23 NOTE — Telephone Encounter (Signed)
Refill for pt's nebulizer solution has been sent to preferred pharmacy. Called and spoke with pt letting her know this had been done and she verbalized understanding. Nothing further needed.

## 2020-01-23 NOTE — Telephone Encounter (Signed)
rx for ventolin inhaler has been sent to preferred pharmacy for pt. Called and spoke with pt letting her know this had been done and she verbalized understanding.nothing further needed.

## 2020-01-25 DIAGNOSIS — J441 Chronic obstructive pulmonary disease with (acute) exacerbation: Secondary | ICD-10-CM | POA: Diagnosis not present

## 2020-01-28 ENCOUNTER — Telehealth: Payer: Self-pay | Admitting: Pulmonary Disease

## 2020-01-28 NOTE — Telephone Encounter (Signed)
Spoke to pt and she thinks she misplaced her most recent refill for Symbicort. I called and spoke to pharmacist at [facility] and she was able to over ride the denial and refill was approved. Notified pt that she can pick up rx. Nothing further needed at this time.

## 2020-03-05 DIAGNOSIS — F4323 Adjustment disorder with mixed anxiety and depressed mood: Secondary | ICD-10-CM | POA: Diagnosis not present

## 2020-03-11 DIAGNOSIS — J4520 Mild intermittent asthma, uncomplicated: Secondary | ICD-10-CM | POA: Diagnosis not present

## 2020-03-11 DIAGNOSIS — J441 Chronic obstructive pulmonary disease with (acute) exacerbation: Secondary | ICD-10-CM | POA: Diagnosis not present

## 2020-03-11 DIAGNOSIS — J45901 Unspecified asthma with (acute) exacerbation: Secondary | ICD-10-CM | POA: Diagnosis not present

## 2020-03-11 DIAGNOSIS — E039 Hypothyroidism, unspecified: Secondary | ICD-10-CM | POA: Diagnosis not present

## 2020-03-24 DIAGNOSIS — J4520 Mild intermittent asthma, uncomplicated: Secondary | ICD-10-CM | POA: Diagnosis not present

## 2020-03-24 DIAGNOSIS — J45901 Unspecified asthma with (acute) exacerbation: Secondary | ICD-10-CM | POA: Diagnosis not present

## 2020-03-24 DIAGNOSIS — E039 Hypothyroidism, unspecified: Secondary | ICD-10-CM | POA: Diagnosis not present

## 2020-03-24 DIAGNOSIS — J441 Chronic obstructive pulmonary disease with (acute) exacerbation: Secondary | ICD-10-CM | POA: Diagnosis not present

## 2020-04-17 ENCOUNTER — Other Ambulatory Visit: Payer: Self-pay | Admitting: Pulmonary Disease

## 2020-04-29 DIAGNOSIS — N390 Urinary tract infection, site not specified: Secondary | ICD-10-CM | POA: Diagnosis not present

## 2020-05-23 ENCOUNTER — Other Ambulatory Visit: Payer: Self-pay | Admitting: Family Medicine

## 2020-05-23 ENCOUNTER — Other Ambulatory Visit (HOSPITAL_COMMUNITY)
Admission: RE | Admit: 2020-05-23 | Discharge: 2020-05-23 | Disposition: A | Discharge status: Home / Self Care | Payer: PPO | Source: Ambulatory Visit | Attending: Family Medicine | Admitting: Family Medicine

## 2020-05-23 DIAGNOSIS — F418 Other specified anxiety disorders: Secondary | ICD-10-CM | POA: Diagnosis not present

## 2020-05-23 DIAGNOSIS — Z0000 Encounter for general adult medical examination without abnormal findings: Secondary | ICD-10-CM | POA: Diagnosis not present

## 2020-05-23 DIAGNOSIS — Z124 Encounter for screening for malignant neoplasm of cervix: Secondary | ICD-10-CM | POA: Insufficient documentation

## 2020-05-23 DIAGNOSIS — E039 Hypothyroidism, unspecified: Secondary | ICD-10-CM | POA: Diagnosis not present

## 2020-05-23 DIAGNOSIS — Z1231 Encounter for screening mammogram for malignant neoplasm of breast: Secondary | ICD-10-CM | POA: Diagnosis not present

## 2020-05-23 DIAGNOSIS — G894 Chronic pain syndrome: Secondary | ICD-10-CM | POA: Diagnosis not present

## 2020-05-23 DIAGNOSIS — Z1151 Encounter for screening for human papillomavirus (HPV): Secondary | ICD-10-CM | POA: Diagnosis not present

## 2020-05-23 DIAGNOSIS — Z23 Encounter for immunization: Secondary | ICD-10-CM | POA: Diagnosis not present

## 2020-05-23 DIAGNOSIS — R7303 Prediabetes: Secondary | ICD-10-CM | POA: Diagnosis not present

## 2020-05-23 DIAGNOSIS — B001 Herpesviral vesicular dermatitis: Secondary | ICD-10-CM | POA: Diagnosis not present

## 2020-05-23 DIAGNOSIS — Z79899 Other long term (current) drug therapy: Secondary | ICD-10-CM | POA: Diagnosis not present

## 2020-05-23 DIAGNOSIS — E7800 Pure hypercholesterolemia, unspecified: Secondary | ICD-10-CM | POA: Diagnosis not present

## 2020-05-27 LAB — CYTOLOGY - PAP
Comment: NEGATIVE
Diagnosis: NEGATIVE
High risk HPV: NEGATIVE

## 2020-06-18 DIAGNOSIS — M170 Bilateral primary osteoarthritis of knee: Secondary | ICD-10-CM | POA: Diagnosis not present

## 2020-06-18 DIAGNOSIS — M961 Postlaminectomy syndrome, not elsewhere classified: Secondary | ICD-10-CM | POA: Diagnosis not present

## 2020-06-18 DIAGNOSIS — M7918 Myalgia, other site: Secondary | ICD-10-CM | POA: Diagnosis not present

## 2020-06-18 DIAGNOSIS — M5416 Radiculopathy, lumbar region: Secondary | ICD-10-CM | POA: Diagnosis not present

## 2020-08-15 ENCOUNTER — Ambulatory Visit: Payer: PPO | Admitting: Pulmonary Disease

## 2020-08-26 DIAGNOSIS — F4323 Adjustment disorder with mixed anxiety and depressed mood: Secondary | ICD-10-CM | POA: Diagnosis not present

## 2020-09-15 ENCOUNTER — Other Ambulatory Visit: Payer: Self-pay | Admitting: Pulmonary Disease

## 2020-09-26 ENCOUNTER — Encounter: Payer: Self-pay | Admitting: Pulmonary Disease

## 2020-09-26 ENCOUNTER — Other Ambulatory Visit: Payer: Self-pay

## 2020-09-26 ENCOUNTER — Ambulatory Visit (INDEPENDENT_AMBULATORY_CARE_PROVIDER_SITE_OTHER): Payer: PPO | Admitting: Pulmonary Disease

## 2020-09-26 VITALS — BP 120/74 | HR 67 | Temp 97.3°F | Ht 66.0 in | Wt 258.0 lb

## 2020-09-26 DIAGNOSIS — J4541 Moderate persistent asthma with (acute) exacerbation: Secondary | ICD-10-CM

## 2020-09-26 LAB — PULMONARY FUNCTION TEST
DL/VA % pred: 134 %
DL/VA: 5.6 ml/min/mmHg/L
DLCO cor % pred: 123 %
DLCO cor: 26.83 ml/min/mmHg
DLCO unc % pred: 123 %
DLCO unc: 26.83 ml/min/mmHg
FEF 25-75 Post: 2.64 L/sec
FEF 25-75 Pre: 2.11 L/sec
FEF2575-%Change-Post: 24 %
FEF2575-%Pred-Post: 105 %
FEF2575-%Pred-Pre: 84 %
FEV1-%Change-Post: 7 %
FEV1-%Pred-Post: 89 %
FEV1-%Pred-Pre: 83 %
FEV1-Post: 2.5 L
FEV1-Pre: 2.32 L
FEV1FVC-%Change-Post: -1 %
FEV1FVC-%Pred-Pre: 100 %
FEV6-%Change-Post: 9 %
FEV6-%Pred-Post: 93 %
FEV6-%Pred-Pre: 85 %
FEV6-Post: 3.23 L
FEV6-Pre: 2.96 L
FEV6FVC-%Pred-Post: 103 %
FEV6FVC-%Pred-Pre: 103 %
FVC-%Change-Post: 9 %
FVC-%Pred-Post: 90 %
FVC-%Pred-Pre: 82 %
FVC-Post: 3.23 L
FVC-Pre: 2.96 L
Post FEV1/FVC ratio: 77 %
Post FEV6/FVC ratio: 100 %
Pre FEV1/FVC ratio: 79 %
Pre FEV6/FVC Ratio: 100 %
RV % pred: 110 %
RV: 2.28 L
TLC % pred: 99 %
TLC: 5.31 L

## 2020-09-26 MED ORDER — BUDESONIDE-FORMOTEROL FUMARATE 80-4.5 MCG/ACT IN AERO: 2.0000 | INHALATION_SPRAY | Freq: Two times a day (BID) | RESPIRATORY_TRACT | 12 refills | Status: DC

## 2020-09-26 NOTE — Progress Notes (Signed)
[name] [name]    [id number]    1961/02/18  Primary Care Physician:Hagler, Apolonio Schneiders, MD  Referring Physician: Caren Macadam, [place],  [place] [address]  Chief complaint: Follow-up for asthma  HPI: 60 year old with history of moderate persistent asthma, mild restrictive lung disease, VATS decortication in January 2017 for empyema. Here for follow-up of asthma.  She is on Symbicort and albuterol.  States that her breathing is doing well with no issues.  Hardly needs to use her rescue inhaler  Pets: Has 2 cats, dogs, outdoor chicken Occupation: Was in the [employer] as a Psychologist, forensic and worked as an Engineer, technical sales Exposures: No known exposures.  No mold, has an outdoor hot tub which she cleans regularly. Smoking history: 12-pack-year smoker.  Quit in 2005 Travel history: Originally from [place], [place]. Previously has lived in [place], [place], [place], [place], [place], [place], [place]. She moved to [place] in 1995. Relevant family history: No significant family history of lung disease  Interim history: Patient reports she is doing well.  Continues on Symbicort twice a day Using her albuterol intermittently  She stopped smoking marijuana and has an significant improvement in breathing.  Outpatient Encounter Medications as of 09/26/2020  Medication Sig  . budesonide-formoterol (SYMBICORT) 160-4.5 MCG/ACT inhaler INHALE TWO PUFFS TWICE DAILY  . fluticasone (FLONASE) 50 MCG/ACT nasal spray INSTILL 2 SPRAYS IN EACH NOSTRIL ONCE DAILY  . gabapentin (NEURONTIN) 600 MG tablet Take 600 mg by mouth daily.   [name] levothyroxine (SYNTHROID, LEVOTHROID) 175 MCG tablet TAKE 1 TABLET BY MOUTH DAILY BEFORE BREAKFAST.  [name] sertraline (ZOLOFT) 100 MG tablet Take 100 mg by mouth daily.  [name] Spacer/Aero Chamber Mouthpiece MISC 1 Device as directed by Does not apply route.  . tizanidine (ZANAFLEX) 2 MG capsule Take 2 mg by mouth 3 times/day as needed-between meals & bedtime. Reported  on 12/11/2015  . albuterol (PROVENTIL) (2.5 MG/3ML) 0.083% nebulizer solution INHALE THE CONTENTS OF ONE VIAL EVERY 4 HOURS AS NEEDED (Patient not taking: Reported on 09/26/2020)  . albuterol (VENTOLIN HFA) 108 (90 Base) MCG/ACT inhaler Inhale 1-2 puffs into the lungs every 4 (four) hours as needed for wheezing or shortness of breath. (Patient not taking: Reported on 09/26/2020)   No facility-administered encounter medications on file as of 09/26/2020.    Physical Exam: Blood pressure 120/74, pulse 67, temperature (!) 97.3 F (36.3 C), temperature source Oral, height 5\' 6"  (1.676 m), weight 258 lb (117 kg), last menstrual period 10/24/2012, SpO2 96 %. Gen:      No acute distress HEENT:  EOMI, sclera anicteric Neck:     No masses; no thyromegaly Lungs:    Clear to auscultation bilaterally; normal respiratory effort CV:         Regular rate and rhythm; no murmurs Abd:      + bowel sounds; soft, non-tender; no palpable masses, no distension Ext:    No edema; adequate peripheral perfusion Skin:      Warm and dry; no rash Neuro: alert and oriented x 3 Psych: normal mood and affect  Data Reviewed: Imaging: CT chest 09/21/2015-right pleural effusion with right lower lobe consolidation, right middle lobe consolidation.  Chest x-ray 09/21/2017-mild interstitial prominence, peribronchial thickening I have reviewed the images personally.  PFTs: 03/31/17: FVC 2.63 L (71%) FEV1 2.04 L (71%) FEV1/FVC 0.78 FEF 25-75 1.65 L (62%) negative bronchodilator response TLC 3.91 L (73%) RV 68% ERV 27% DLCO uncorrected 95% 04/28/16: FVC 2.70 L (  73%) FEV1 2.00 L (69%) FEV1/FVC 0.74 FEF 25-75 1.14 L (53%) negative bronchodilator response TLC 5.11 L (95%) RV 113% ERV 7% DLCO corrected 97% (hgb 15.7)  09/26/20 FVC 3.23 [90%], FEV1 2.50 [89%], F/F 77, TLC 5.31 [99%], DLCO 26.83 (23%] Normal test  6MWT 04/28/16: Walked 452 meters / Baseline Sat 100% on RA / Nadir Sat 94% on RA  ACQ 04/25/19- 1.6  Labs: Right  Pleural Fluid (09/24/15): No malignant cells. Right Pleural Fluid (09/22/15): No malignant cells. Right Pleural Peel (09/22/15): No atypia or malignancy identified. Benign organizing fibrin inflammatory debris.  Micro Right Pleura Visceral & Parietal (09/24/15): Negative Right Pleural Fluid (09/24/15): Negative  Right Pleural Fluid (09/24/15): Negative  Right Bronchial Wash (09/24/15): Negative  Right Pleural Fluid (09/22/15): Negative   PFA (09/22/15) Glucose: <20 LDH: 3339 Total protein: 5.2 (serum 6.7) PH: 7.4 WBC: 7830 (neutro 36%, monocytes 53%, lymphs 11%)  Assessment:  Moderate persistent asthma On Symbicort. She has had improvement in symptoms since she quit smoking marijuana  Will attempt to de-escalate inhalers.  Perhaps we can get her off them altogether as her lung function test is normal with no significant peripheral eosinophilia or IgE.  Reduced Symbicort 80/4.5  Advised her to continue weight loss with diet and exercise  Health maintenance 11/13/2015- Prevnar 04/27/2018-Pneumovax  Plan/Recommendations: - Continue Symbicort.  Reduce dose to 80/4.5   Marshell Garfinkel MD [facility] 09/26/2020, 10:11 AM  CC: Caren Macadam, MD

## 2020-09-26 NOTE — Progress Notes (Signed)
Full PFT performed today. °

## 2020-09-26 NOTE — Patient Instructions (Signed)
I am glad you are doing well with regard to your breathing Continue Symbicort.  Will reduce dose to 80/4.5 Follow-up in 6 months.

## 2020-09-26 NOTE — Addendum Note (Signed)
Addended byCoralie Keens on: 09/26/2020 10:29 AM   Modules accepted: Orders

## 2020-11-05 DIAGNOSIS — F4323 Adjustment disorder with mixed anxiety and depressed mood: Secondary | ICD-10-CM | POA: Diagnosis not present

## 2020-12-10 DIAGNOSIS — E039 Hypothyroidism, unspecified: Secondary | ICD-10-CM | POA: Diagnosis not present

## 2020-12-10 DIAGNOSIS — E7800 Pure hypercholesterolemia, unspecified: Secondary | ICD-10-CM | POA: Diagnosis not present

## 2020-12-10 DIAGNOSIS — F419 Anxiety disorder, unspecified: Secondary | ICD-10-CM | POA: Diagnosis not present

## 2020-12-10 DIAGNOSIS — R7303 Prediabetes: Secondary | ICD-10-CM | POA: Diagnosis not present

## 2020-12-10 DIAGNOSIS — G894 Chronic pain syndrome: Secondary | ICD-10-CM | POA: Diagnosis not present

## 2020-12-10 DIAGNOSIS — J4540 Moderate persistent asthma, uncomplicated: Secondary | ICD-10-CM | POA: Diagnosis not present

## 2020-12-10 DIAGNOSIS — Z79899 Other long term (current) drug therapy: Secondary | ICD-10-CM | POA: Diagnosis not present

## 2020-12-17 DIAGNOSIS — F4323 Adjustment disorder with mixed anxiety and depressed mood: Secondary | ICD-10-CM | POA: Diagnosis not present

## 2021-01-05 ENCOUNTER — Other Ambulatory Visit (HOSPITAL_COMMUNITY): Payer: Self-pay | Admitting: Family Medicine

## 2021-01-05 DIAGNOSIS — Z1231 Encounter for screening mammogram for malignant neoplasm of breast: Secondary | ICD-10-CM

## 2021-01-12 ENCOUNTER — Ambulatory Visit (HOSPITAL_COMMUNITY): Admission: RE | Admit: 2021-01-12 | Payer: PPO | Source: Ambulatory Visit

## 2021-01-21 ENCOUNTER — Ambulatory Visit (HOSPITAL_COMMUNITY): Payer: PPO

## 2021-01-22 ENCOUNTER — Ambulatory Visit (HOSPITAL_COMMUNITY)
Admission: RE | Admit: 2021-01-22 | Discharge: 2021-01-22 | Disposition: A | Discharge status: Home / Self Care | Payer: PPO | Source: Ambulatory Visit | Attending: Family Medicine | Admitting: Family Medicine

## 2021-01-22 ENCOUNTER — Other Ambulatory Visit: Payer: Self-pay

## 2021-01-22 DIAGNOSIS — Z1231 Encounter for screening mammogram for malignant neoplasm of breast: Secondary | ICD-10-CM

## 2021-03-05 DIAGNOSIS — J4520 Mild intermittent asthma, uncomplicated: Secondary | ICD-10-CM | POA: Diagnosis not present

## 2021-03-05 DIAGNOSIS — E039 Hypothyroidism, unspecified: Secondary | ICD-10-CM | POA: Diagnosis not present

## 2021-03-05 DIAGNOSIS — J441 Chronic obstructive pulmonary disease with (acute) exacerbation: Secondary | ICD-10-CM | POA: Diagnosis not present

## 2021-03-05 DIAGNOSIS — E7800 Pure hypercholesterolemia, unspecified: Secondary | ICD-10-CM | POA: Diagnosis not present

## 2021-03-05 DIAGNOSIS — J4540 Moderate persistent asthma, uncomplicated: Secondary | ICD-10-CM | POA: Diagnosis not present

## 2021-03-05 DIAGNOSIS — J45901 Unspecified asthma with (acute) exacerbation: Secondary | ICD-10-CM | POA: Diagnosis not present

## 2021-03-17 ENCOUNTER — Other Ambulatory Visit: Payer: Self-pay | Admitting: Pulmonary Disease

## 2021-03-27 ENCOUNTER — Ambulatory Visit
Admission: EM | Admit: 2021-03-27 | Discharge: 2021-03-27 | Disposition: A | Discharge status: Home / Self Care | Payer: PPO | Attending: Emergency Medicine | Admitting: Emergency Medicine

## 2021-03-27 ENCOUNTER — Encounter: Payer: Self-pay | Admitting: Emergency Medicine

## 2021-03-27 ENCOUNTER — Ambulatory Visit (INDEPENDENT_AMBULATORY_CARE_PROVIDER_SITE_OTHER): Payer: PPO

## 2021-03-27 DIAGNOSIS — M79671 Pain in right foot: Secondary | ICD-10-CM | POA: Diagnosis not present

## 2021-03-27 DIAGNOSIS — S93401A Sprain of unspecified ligament of right ankle, initial encounter: Secondary | ICD-10-CM | POA: Diagnosis not present

## 2021-03-27 DIAGNOSIS — M25571 Pain in right ankle and joints of right foot: Secondary | ICD-10-CM

## 2021-03-27 DIAGNOSIS — M7989 Other specified soft tissue disorders: Secondary | ICD-10-CM | POA: Diagnosis not present

## 2021-03-27 NOTE — ED Triage Notes (Signed)
Right ankle swelling.  States she stepped down from a ladder and twisted her ankle a few days ago.

## 2021-03-27 NOTE — ED Provider Notes (Signed)
[name] [name]   [id number] 03/27/21 Arrival Time: 81  CC: RT ankle/ foot PAIN  SUBJECTIVE: History from: patient. [name] [name] is a 60 y.o. female complains of RT ankle/ foot pain and injury that occurred a few days ago.  Twisted ankle while stepping off ladder.  Localizes the pain to the outside of foot.  Describes the pain as intermittent, throbbing, and achy in character.  Has tried OTC medications without relief.  Symptoms are made worse with weight-bearing.  Denies similar symptoms in the past.  Complains of effusion.  Denies fever, chills, erythema, ecchymosis, weakness, numbness and tingling.  ROS: As per HPI.  All other pertinent ROS negative.     Past Medical History:  Diagnosis Date   Allergy    Anxiety    Arthritis    low back   Asthma    Carpal tunnel syndrome 01/13/2015   Bilateral   Depression    FIBROIDS, UTERUS 10/17/2006   Qualifier: Diagnosis of  By: Jonna Munro MD, Cornelius     Hyperlipidemia    Nerve damage    right leg   Neuromuscular disorder ([facility])    fibromyalgia   Substance abuse ([place])    recovered alcoholic   Thyroid disease    hypo thyroid   Past Surgical History:  Procedure Laterality Date   ANTERIOR CERVICAL DECOMP/DISCECTOMY FUSION     L4, L5    SHOULDER ARTHROSCOPY WITH ROTATOR CUFF REPAIR AND SUBACROMIAL DECOMPRESSION     TONSILLECTOMY     VIDEO ASSISTED THORACOSCOPY (VATS)/DECORTICATION Right 09/24/2015   Procedure: VIDEO ASSISTED THORACOSCOPY (VATS)/DECORTICATION;  Surgeon: Melrose Nakayama, MD;  Location: [facility];  Service: Thoracic;  Laterality: Right;   VIDEO BRONCHOSCOPY N/A 09/24/2015   Procedure: VIDEO BRONCHOSCOPY;  Surgeon: Melrose Nakayama, MD;  Location: [facility];  Service: Thoracic;  Laterality: N/A;   Allergies  Allergen Reactions   Celebrex [Celecoxib] Nausea And Vomiting    Severe epigastric pain   Nsaids Other (See Comments)    SEVERE EPIGASTRIC PAIN   Other Other (See Comments)   Percocet  [Oxycodone-Acetaminophen] Itching   No current facility-administered medications on file prior to encounter.   Current Outpatient Medications on File Prior to Encounter  Medication Sig Dispense Refill   albuterol (PROVENTIL) (2.5 MG/3ML) 0.083% nebulizer solution INHALE THE CONTENTS OF ONE VIAL EVERY 4 HOURS AS NEEDED 90 mL 0   albuterol (VENTOLIN HFA) 108 (90 Base) MCG/ACT inhaler INHALE 1-2 PUFFS BY MOUTH EVERY 4 HOURS AS NEEDED FOR WHEEZING, SHORTNESS OF BREATH 8.5 g 0   budesonide-formoterol (SYMBICORT) 160-4.5 MCG/ACT inhaler INHALE TWO PUFFS TWICE DAILY 10.2 g 3   budesonide-formoterol (SYMBICORT) 80-4.5 MCG/ACT inhaler Inhale 2 puffs into the lungs in the morning and at bedtime. 1 each 12   fluticasone (FLONASE) 50 MCG/ACT nasal spray INSTILL 2 SPRAYS IN EACH NOSTRIL ONCE DAILY 16 g 5   gabapentin (NEURONTIN) 600 MG tablet Take 600 mg by mouth daily.      levothyroxine (SYNTHROID, LEVOTHROID) 175 MCG tablet TAKE 1 TABLET BY MOUTH DAILY BEFORE BREAKFAST. 90 tablet 3   sertraline (ZOLOFT) 100 MG tablet Take 100 mg by mouth daily.     Spacer/Aero Chamber Mouthpiece MISC 1 Device as directed by Does not apply route. 1 each 0   tizanidine (ZANAFLEX) 2 MG capsule Take 2 mg by mouth 3 times/day as needed-between meals & bedtime. Reported on 12/11/2015     Social History   Socioeconomic History   Marital status: Significant Other  Spouse name: [name] [name]   Number of children: Not on file   Years of education: 14   Highest education level: Not on file  Occupational History   Occupation: Disabled    Comment: Electrical engineer  Tobacco Use   Smoking status: Former    Packs/day: 1.00    Types: E-cigarettes, Cigarettes    Start date: 04/16/1971    Quit date: 09/14/2007    Years since quitting: 13.5   Smokeless tobacco: Former    Types: Snuff, Sarina Ser    Quit date: 09/14/2007  Vaping Use   Vaping Use: Former   Quit date: 07/26/2017  Substance and Sexual Activity   Alcohol use: No     Alcohol/week: 0.0 standard drinks    Comment: Quit 11/11/1989   Drug use: No    Types: "Crack" cocaine, Marijuana    Comment: Quit in 1991   Sexual activity: Yes    Birth control/protection: Post-menopausal  Other Topics Concern   Not on file  Social History Narrative   Originally from [place], [place]. Previously has lived in [place], [place], [place], [place], [place], [place], [place]. She moved to [place] in 1995. 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 Currently has dogs and a cat and chickens . She has an outdoor hot tub that they use regularly. Previously had mold & mildew in their home for approximately 1 year.    Lives with [place]   Lives on acres   Social Determinants of Health   Financial Resource Strain: Not on file  Food Insecurity: Not on file  Transportation Needs: Not on file  Physical Activity: Not on file  Stress: Not on file  Social Connections: Not on file  Intimate Partner Violence: Not on file   Family History  Problem Relation Age of Onset   Bone cancer Father    Asthma Father    Diabetes Father    Alcohol abuse Father    Cancer Father    COPD Father    Heart disease Father    Hypertension Father    Hyperlipidemia Father    Pneumonia Mother        biological mother deceased at age 57   Depression Mother    Alcohol abuse Sister    Alcohol abuse Brother    Alcohol abuse Sister    Alcohol abuse Brother    Colon cancer Neg Hx    Esophageal cancer Neg Hx    Stomach cancer Neg Hx    Rectal cancer Neg Hx     OBJECTIVE:  Vitals:   03/27/21 1627  BP: 121/75  Pulse: 71  Resp: 16  Temp: 98.6 F (37 C)  TempSrc: Oral  SpO2: 96%    General appearance: ALERT; in no acute distress.  Head: NCAT Lungs: Normal respiratory effort CV: Dorsalis pedis pulse 2+  Musculoskeletal: RT ankle/ foot Inspection: Skin warm, dry, clear and intact without obvious erythema, effusion, or ecchymosis.   Palpation: Diffusely TTP over fifth and fourth MTs Strength: deferred Skin: warm and dry Neurologic: Ambulates with minimal difficulty; Sensation intact about the upper/ lower extremities Psychological: alert and cooperative; normal mood and affect  DIAGNOSTIC STUDIES:  DG Ankle Complete Right  Result Date: 03/27/2021 CLINICAL DATA:  Twisting injury with pain and swelling EXAM: RIGHT ANKLE - COMPLETE 3+ VIEW COMPARISON:  None. FINDINGS: No acute displaced fracture or malalignment. Mortise grossly intact. Small plantar calcaneal spur IMPRESSION: No acute osseous abnormality  Electronically Signed   By: Donavan Foil M.D.   On: 03/27/2021 16:56     X-rays negative for bony abnormalities including fracture, or dislocation.  No soft tissue swelling.    I have reviewed the x-rays myself and the radiologist interpretation. I am in agreement with the radiologist interpretation.     ASSESSMENT & PLAN:  1. Right foot pain   2. Acute right ankle pain     X-rays negative for fracture or dislocation Continue conservative management of rest, ice, and elevation Alternate ibuprofen and tylenol for pain Follow up with PCP if symptoms persist Return or go to the ER if you have any new or worsening symptoms (fever, chills, chest pain, redness, swelling, bruising, deformity, etc...)    Reviewed expectations re: course of current medical issues. Questions answered. Outlined signs and symptoms indicating need for more acute intervention. Patient verbalized understanding.      Lestine Box, PA-C 03/27/21 1717

## 2021-04-08 DIAGNOSIS — R499 Unspecified voice and resonance disorder: Secondary | ICD-10-CM | POA: Diagnosis not present

## 2021-04-08 DIAGNOSIS — F39 Unspecified mood [affective] disorder: Secondary | ICD-10-CM | POA: Diagnosis not present

## 2021-04-08 DIAGNOSIS — E039 Hypothyroidism, unspecified: Secondary | ICD-10-CM | POA: Diagnosis not present

## 2021-04-08 DIAGNOSIS — R1310 Dysphagia, unspecified: Secondary | ICD-10-CM | POA: Diagnosis not present

## 2021-04-08 DIAGNOSIS — N393 Stress incontinence (female) (male): Secondary | ICD-10-CM | POA: Diagnosis not present

## 2021-04-13 ENCOUNTER — Encounter: Payer: Self-pay | Admitting: Gastroenterology

## 2021-04-22 DIAGNOSIS — M25551 Pain in right hip: Secondary | ICD-10-CM | POA: Diagnosis not present

## 2021-04-29 ENCOUNTER — Ambulatory Visit: Payer: PPO | Admitting: Physician Assistant

## 2021-05-12 DIAGNOSIS — M25551 Pain in right hip: Secondary | ICD-10-CM | POA: Diagnosis not present

## 2021-05-20 ENCOUNTER — Ambulatory Visit (INDEPENDENT_AMBULATORY_CARE_PROVIDER_SITE_OTHER): Payer: PPO | Admitting: Otolaryngology

## 2021-05-20 ENCOUNTER — Other Ambulatory Visit: Payer: Self-pay

## 2021-05-20 DIAGNOSIS — K219 Gastro-esophageal reflux disease without esophagitis: Secondary | ICD-10-CM

## 2021-05-20 DIAGNOSIS — R490 Dysphonia: Secondary | ICD-10-CM

## 2021-05-20 MED ORDER — OMEPRAZOLE 40 MG PO CPDR: 40.0000 mg | DELAYED_RELEASE_CAPSULE | Freq: Every day | ORAL | 3 refills | Status: DC

## 2021-05-20 NOTE — Progress Notes (Addendum)
HPI: [name] [name] is a 60 y.o. female who presents is referred by her PCP for evaluation of hoarseness and voice change as well as occasional trouble swallowing.  She states that a couple times a month she has trouble swallowing food that makes her choke and cough.  This does not happen necessarily regularly but once or twice a month. On conversation in the office today she has minimal hoarseness. She used to smoke but quit about 5 years ago. She is moderately overweight and trying to lose weight..  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   Thyroid disease    hypo thyroid   Past Surgical History:  Procedure Laterality Date   ANTERIOR CERVICAL DECOMP/DISCECTOMY FUSION     L4, L5    SHOULDER ARTHROSCOPY WITH ROTATOR CUFF REPAIR AND SUBACROMIAL DECOMPRESSION     TONSILLECTOMY     VIDEO ASSISTED THORACOSCOPY (VATS)/DECORTICATION Right 09/24/2015   Procedure: VIDEO ASSISTED THORACOSCOPY (VATS)/DECORTICATION;  Surgeon: Melrose Nakayama, MD;  Location: [facility];  Service: Thoracic;  Laterality: Right;   VIDEO BRONCHOSCOPY N/A 09/24/2015   Procedure: VIDEO BRONCHOSCOPY;  Surgeon: Melrose Nakayama, MD;  Location: [facility];  Service: Thoracic;  Laterality: N/A;   Social History   Socioeconomic History   Marital status: Significant Other    Spouse name: Dianna   Number of children: Not on file   Years of education: 14   Highest education level: Not on file  Occupational History   Occupation: Disabled    Comment: Electrical engineer  Tobacco Use   Smoking  status: Former    Packs/day: 1.00    Types: E-cigarettes, Cigarettes    Start date: 04/16/1971    Quit date: 09/14/2007    Years since quitting: 13.6   Smokeless tobacco: Former    Types: Snuff, Chew    Quit date: 09/14/2007  Vaping Use   Vaping Use: Former   Quit date: 07/26/2017  Substance and Sexual Activity   Alcohol use: No    Alcohol/week: 0.0 standard drinks    Comment: Quit 11/11/1989   Drug use: No    Types: "Crack" cocaine, Marijuana    Comment: Quit in 1991   Sexual activity: Yes    Birth control/protection: Post-menopausal  Other Topics Concern   Not on file  Social History Narrative   Originally from [place], [place]. Previously has lived in [place], [place], [place], [place], [place], [place], [place]. She moved to [place] in 1995. 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 Currently has dogs and a cat and chickens . She has an outdoor hot tub that they use regularly. Previously had mold & mildew in their home for approximately 1 year.    Lives with [place]   Lives on acres   Social Determinants of Health   Financial Resource Strain: Not on file  Food Insecurity: Not on  file  Transportation Needs: Not on file  Physical Activity: Not on file  Stress: Not on file  Social Connections: Not on file   Family History  Problem Relation Age of Onset   Bone cancer Father    Asthma Father    Diabetes Father    Alcohol abuse Father    Cancer Father    COPD Father    Heart disease Father    Hypertension Father    Hyperlipidemia Father    Pneumonia Mother        biological mother deceased at age 5   Depression Mother    Alcohol abuse Sister    Alcohol abuse Brother    Alcohol abuse Sister    Alcohol abuse Brother    Colon cancer Neg Hx    Esophageal cancer Neg Hx    Stomach cancer Neg Hx    Rectal cancer Neg Hx    Allergies  Allergen Reactions   Celebrex [Celecoxib] Nausea And Vomiting    Severe epigastric  pain   Nsaids Other (See Comments)    SEVERE EPIGASTRIC PAIN   Other Other (See Comments)   Percocet [Oxycodone-Acetaminophen] Itching   Prior to Admission medications   Medication Sig Start Date End Date Taking? Authorizing Provider  albuterol (PROVENTIL) (2.5 MG/3ML) 0.083% nebulizer solution INHALE THE CONTENTS OF ONE VIAL EVERY 4 HOURS AS NEEDED 03/18/21   Mannam, Praveen, MD  albuterol (VENTOLIN HFA) 108 (90 Base) MCG/ACT inhaler INHALE 1-2 PUFFS BY MOUTH EVERY 4 HOURS AS NEEDED FOR WHEEZING, SHORTNESS OF BREATH 03/18/21   Mannam, Praveen, MD  budesonide-formoterol (SYMBICORT) 160-4.5 MCG/ACT inhaler INHALE TWO PUFFS TWICE DAILY 04/17/20   Mannam, Praveen, MD  budesonide-formoterol (SYMBICORT) 80-4.5 MCG/ACT inhaler Inhale 2 puffs into the lungs in the morning and at bedtime. 09/26/20   Mannam, Hart Robinsons, MD  fluticasone (FLONASE) 50 MCG/ACT nasal spray INSTILL 2 SPRAYS IN EACH NOSTRIL ONCE DAILY 09/15/20   Mannam, Praveen, MD  gabapentin (NEURONTIN) 600 MG tablet Take 600 mg by mouth daily.     [provider]  levothyroxine (SYNTHROID, LEVOTHROID) 175 MCG tablet TAKE 1 TABLET BY MOUTH DAILY BEFORE BREAKFAST. 07/12/17   Raylene Everts, MD  sertraline (ZOLOFT) 100 MG tablet Take 100 mg by mouth daily. 03/14/14   [provider]  Spacer/Aero Chamber Mouthpiece MISC 1 Device as directed by Does not apply route. 07/26/17   Javier Glazier, MD  tizanidine (ZANAFLEX) 2 MG capsule Take 2 mg by mouth 3 times/day as needed-between meals & bedtime. Reported on 12/11/2015    [provider]     Positive ROS: Otherwise negative  All other systems have been reviewed and were otherwise negative with the exception of those mentioned in the HPI and as above.  Physical Exam: Constitutional: Alert, well-appearing, no acute distress Ears: External ears without lesions or tenderness. Ear canals are clear bilaterally with intact, clear TMs.  Nasal: External nose without lesions.  Septum with minimal deviation mild rhinitis.  She does not complain of any trouble breathing through her nose.. Clear nasal passages otherwise. Oral: Lips and gums without lesions. Tongue and palate mucosa without lesions. Posterior oropharynx clear.  She is status post tonsillectomy. Fiberoptic laryngoscopy was performed to the right nostril.  The nasopharynx was clear.  The base of tongue vallecula epiglottis were normal.  The vocal cords were clear bilaterally with normal vocal cord mobility.  However she had moderate edema of the arytenoid mucosa with mild erythema consistent with probable reflux type symptoms.  No specific mucosal lesions noted and piriform sinuses were clear. Neck: No palpable adenopathy or masses Respiratory: Breathing comfortably  Skin: No facial/neck lesions or rash noted.  Laryngoscopy  Date/Time: 05/20/2021 12:35 PM Performed by: Rozetta Nunnery, MD Authorized by: Rozetta Nunnery, MD   Consent:    Consent obtained:  Verbal   Consent given by:  Patient Procedure details:    Indications: direct visualization of the upper aerodigestive tract and hoarseness, dysphagia, or aspiration     Medication:  Afrin   Instrument: flexible fiberoptic laryngoscope     Scope location: right nare   Sinus:    Right middle meatus: normal     Right nasopharynx: normal   Mouth:    Oropharynx: normal     Vallecula: normal     Base of tongue: normal     Epiglottis: normal   Throat:    Pyriform sinus: normal     True vocal cords: normal   Comments:     On fiberoptic laryngoscopy the vocal cords were clear bilaterally with normal vocal mobility.  However she had moderate edema of the arytenoid mucosa as well as erythema consistent with reflux symptoms.  Assessment: I suspect the voice change in intermittent difficulty swallowing is related to reflux disease.  As vocal cords are clear bilaterally.  She does have moderate edema and erythema of the arytenoid mucosa  consistent with reflux.  Plan: Placed on omeprazole 40 mg daily before dinner for the next 2 months. If she continues to have difficulty with swallowing consider further evaluation with gastroenterology Also discussed other measures to help reduce reflux such as elevating the head of the bed 2 or 3 inches as well as not eating within 2 to 3 hours of going to bed.  Radene Journey, MD   CC:

## 2021-05-21 DIAGNOSIS — M25551 Pain in right hip: Secondary | ICD-10-CM | POA: Diagnosis not present

## 2021-05-25 DIAGNOSIS — M25551 Pain in right hip: Secondary | ICD-10-CM | POA: Diagnosis not present

## 2021-05-26 ENCOUNTER — Other Ambulatory Visit: Payer: Self-pay | Admitting: Pulmonary Disease

## 2021-05-27 ENCOUNTER — Ambulatory Visit: Payer: PPO | Admitting: Gastroenterology

## 2021-07-08 DIAGNOSIS — R351 Nocturia: Secondary | ICD-10-CM | POA: Diagnosis not present

## 2021-07-08 DIAGNOSIS — N3944 Nocturnal enuresis: Secondary | ICD-10-CM | POA: Diagnosis not present

## 2021-07-08 DIAGNOSIS — R350 Frequency of micturition: Secondary | ICD-10-CM | POA: Diagnosis not present

## 2021-07-16 ENCOUNTER — Ambulatory Visit (INDEPENDENT_AMBULATORY_CARE_PROVIDER_SITE_OTHER): Payer: PPO

## 2021-07-16 ENCOUNTER — Ambulatory Visit: Payer: PPO

## 2021-07-16 ENCOUNTER — Ambulatory Visit
Admission: EM | Admit: 2021-07-16 | Discharge: 2021-07-16 | Disposition: A | Discharge status: Home / Self Care | Payer: PPO | Attending: Urgent Care | Admitting: Urgent Care

## 2021-07-16 ENCOUNTER — Other Ambulatory Visit: Payer: Self-pay

## 2021-07-16 DIAGNOSIS — M25571 Pain in right ankle and joints of right foot: Secondary | ICD-10-CM

## 2021-07-16 DIAGNOSIS — S93401A Sprain of unspecified ligament of right ankle, initial encounter: Secondary | ICD-10-CM

## 2021-07-16 MED ORDER — IBUPROFEN 800 MG PO TABS: 800.0000 mg | ORAL_TABLET | Freq: Three times a day (TID) | ORAL | 0 refills | Status: AC

## 2021-07-16 NOTE — ED Provider Notes (Signed)
[name] [name]   MRN: [id number] DOB: Sep 07, 1961  Subjective:   [name] [name] is a 60 y.o. female presenting for 1 day history of persistent right ankle pain with swelling.  Patient twisted her ankle when she stepped out of the truck into this.  Has been doing conservative management, icing, wrapping, Tylenol.  Would like to make sure she does not have a fracture.  No current facility-administered medications for this encounter.  Current Outpatient Medications:    albuterol (PROVENTIL) (2.5 MG/3ML) 0.083% nebulizer solution, INHALE THE CONTENTS OF ONE VIAL EVERY 4 HOURS AS NEEDED, Disp: 90 mL, Rfl: 0   albuterol (VENTOLIN HFA) 108 (90 Base) MCG/ACT inhaler, INHALE 1-2 PUFFS BY MOUTH EVERY 4 HOURS AS NEEDED FOR WHEEZING, SHORTNESS OF BREATH, Disp: 8.5 g, Rfl: 3   budesonide-formoterol (SYMBICORT) 160-4.5 MCG/ACT inhaler, INHALE TWO PUFFS TWICE DAILY, Disp: 10.2 g, Rfl: 3   budesonide-formoterol (SYMBICORT) 80-4.5 MCG/ACT inhaler, Inhale 2 puffs into the lungs in the morning and at bedtime., Disp: 1 each, Rfl: 12   fluticasone (FLONASE) 50 MCG/ACT nasal spray, INSTILL 2 SPRAYS IN EACH NOSTRIL ONCE DAILY, Disp: 16 g, Rfl: 5   gabapentin (NEURONTIN) 600 MG tablet, Take 600 mg by mouth daily. , Disp: , Rfl:    levothyroxine (SYNTHROID, LEVOTHROID) 175 MCG tablet, TAKE 1 TABLET BY MOUTH DAILY BEFORE BREAKFAST., Disp: 90 tablet, Rfl: 3   omeprazole (PRILOSEC) 40 MG capsule, Take 1 capsule (40 mg total) by mouth daily. 1 tablet daily before dinner, Disp: 30 capsule, Rfl: 3   sertraline (ZOLOFT) 100 MG tablet, Take 100 mg by mouth daily., Disp: , Rfl:    Tour manager MISC, 1 Device as directed by Does not apply route., Disp: 1 each, Rfl: 0   tizanidine (ZANAFLEX) 2 MG capsule, Take 2 mg by mouth 3 times/day as needed-between meals & bedtime. Reported on 12/11/2015, Disp: , Rfl:    Allergies  Allergen Reactions   Celebrex [Celecoxib] Nausea And Vomiting    Severe  epigastric pain   Nsaids Other (See Comments)    SEVERE EPIGASTRIC PAIN   Other Other (See Comments)   Percocet [Oxycodone-Acetaminophen] Itching    Past Medical History:  Diagnosis Date   Allergy    Anxiety    Arthritis    low back   Asthma    Carpal tunnel syndrome 01/13/2015   Bilateral   Depression    FIBROIDS, UTERUS 10/17/2006   Qualifier: Diagnosis of  By: Jonna Munro MD, Cornelius     Fibromyalgia    HLD (hyperlipidemia)    Hyperlipidemia    Lumbar degenerative disc disease    Nerve damage    right leg   Neuromuscular disorder ([place])    fibromyalgia   Substance abuse ([place])    recovered alcoholic   Thyroid disease    hypo thyroid     Past Surgical History:  Procedure Laterality Date   ANTERIOR CERVICAL DECOMP/DISCECTOMY FUSION     L4, L5    SHOULDER ARTHROSCOPY WITH ROTATOR CUFF REPAIR AND SUBACROMIAL DECOMPRESSION     TONSILLECTOMY     VIDEO ASSISTED THORACOSCOPY (VATS)/DECORTICATION Right 09/24/2015   Procedure: VIDEO ASSISTED THORACOSCOPY (VATS)/DECORTICATION;  Surgeon: Melrose Nakayama, MD;  Location: [place];  Service: Thoracic;  Laterality: Right;   VIDEO BRONCHOSCOPY N/A 09/24/2015   Procedure: VIDEO BRONCHOSCOPY;  Surgeon: Melrose Nakayama, MD;  Location: [facility] OR;  Service: Thoracic;  Laterality: N/A;    Family History  Problem Relation Age of Onset   Bone cancer  Father    Asthma Father    Diabetes Father    Alcohol abuse Father    Cancer Father    COPD Father    Heart disease Father    Hypertension Father    Hyperlipidemia Father    Pneumonia Mother        biological mother deceased at age 63   Depression Mother    Alcohol abuse Sister    Alcohol abuse Brother    Alcohol abuse Sister    Alcohol abuse Brother    Colon cancer Neg Hx    Esophageal cancer Neg Hx    Stomach cancer Neg Hx    Rectal cancer Neg Hx     Social History   Tobacco Use   Smoking status: Former    Packs/day: 1.00    Types: E-cigarettes, Cigarettes    Start  date: 04/16/1971    Quit date: 09/14/2007    Years since quitting: 13.8   Smokeless tobacco: Former    Types: Snuff, Chew    Quit date: 09/14/2007  Vaping Use   Vaping Use: Former   Quit date: 07/26/2017  Substance Use Topics   Alcohol use: No    Alcohol/week: 0.0 standard drinks    Comment: Quit 11/11/1989   Drug use: No    Types: "Crack" cocaine, Marijuana    Comment: Quit in 1991    ROS   Objective:   Vitals: BP 122/76 (BP Location: Right Arm)   Pulse 80   Temp 98.3 F (36.8 C) (Oral)   Resp 18   LMP 10/24/2012   SpO2 94%   Physical Exam Constitutional:      General: She is not in acute distress.    Appearance: Normal appearance. She is well-developed. She is not ill-appearing, toxic-appearing or diaphoretic.  HENT:     Head: Normocephalic and atraumatic.     Nose: Nose normal.     Mouth/Throat:     Mouth: Mucous membranes are moist.     Pharynx: Oropharynx is clear.  Eyes:     General: No scleral icterus.       Right eye: No discharge.        Left eye: No discharge.     Extraocular Movements: Extraocular movements intact.     Conjunctiva/sclera: Conjunctivae normal.     Pupils: Pupils are equal, round, and reactive to light.  Cardiovascular:     Rate and Rhythm: Normal rate.  Pulmonary:     Effort: Pulmonary effort is normal.  Musculoskeletal:     Right ankle: No swelling, deformity, ecchymosis or lacerations. Tenderness present over the lateral malleolus, ATF ligament and AITF ligament. No medial malleolus, CF ligament, posterior TF ligament, base of 5th metatarsal or proximal fibula tenderness. Decreased range of motion.     Right Achilles Tendon: No tenderness or defects. Thompson's test negative.  Skin:    General: Skin is warm and dry.  Neurological:     General: No focal deficit present.     Mental Status: She is alert and oriented to person, place, and time.     Motor: No weakness.     Coordination: Coordination normal.     Gait: Gait normal.      Deep Tendon Reflexes: Reflexes normal.  Psychiatric:        Mood and Affect: Mood normal.        Behavior: Behavior normal.        Thought Content: Thought content normal.        Judgment:  Judgment normal.    DG Ankle Complete Right  Result Date: 07/16/2021 CLINICAL DATA:  Twisted ankle. EXAM: RIGHT ANKLE - COMPLETE 3+ VIEW COMPARISON:  Right ankle radiograph 03/27/2021. FINDINGS: There is no evidence of fracture, dislocation, or joint effusion. Moderate degenerative changes. Regional soft tissues are unremarkable. IMPRESSION: No acute osseous abnormality in the right ankle. Electronically Signed   By: Audie Pinto M.D.   On: 07/16/2021 19:56    Right ankle wrapped using 4" Ace wrap in figure-8 method.   Assessment and Plan :   PDMP not reviewed this encounter.  1. Sprain of right ankle, unspecified ligament, initial encounter   2. Acute right ankle pain     Will manage for ankle sprain with rice method, NSAID. Counseled patient on potential for adverse effects with medications prescribed/recommended today, ER and return-to-clinic precautions discussed, patient verbalized understanding.     Jaynee Eagles, PA-C 07/16/21 2002

## 2021-07-16 NOTE — ED Triage Notes (Signed)
Patient presents to the Urgent Care with complaints of right ankle pain. Stepped out of truck into a ditch and twisted ankle. Pt states she has had tylenol for pain, least dose at 2pm. Pt states she tried ice, elevation and wrapping it up but nothing is working.

## 2021-07-20 DIAGNOSIS — F4323 Adjustment disorder with mixed anxiety and depressed mood: Secondary | ICD-10-CM | POA: Diagnosis not present

## 2021-07-24 DIAGNOSIS — E7800 Pure hypercholesterolemia, unspecified: Secondary | ICD-10-CM | POA: Diagnosis not present

## 2021-07-24 DIAGNOSIS — G894 Chronic pain syndrome: Secondary | ICD-10-CM | POA: Diagnosis not present

## 2021-07-24 DIAGNOSIS — R7303 Prediabetes: Secondary | ICD-10-CM | POA: Diagnosis not present

## 2021-07-24 DIAGNOSIS — Z79899 Other long term (current) drug therapy: Secondary | ICD-10-CM | POA: Diagnosis not present

## 2021-07-24 DIAGNOSIS — E039 Hypothyroidism, unspecified: Secondary | ICD-10-CM | POA: Diagnosis not present

## 2021-07-24 DIAGNOSIS — Z23 Encounter for immunization: Secondary | ICD-10-CM | POA: Diagnosis not present

## 2021-07-24 DIAGNOSIS — N393 Stress incontinence (female) (male): Secondary | ICD-10-CM | POA: Diagnosis not present

## 2021-07-24 DIAGNOSIS — J4520 Mild intermittent asthma, uncomplicated: Secondary | ICD-10-CM | POA: Diagnosis not present

## 2021-07-24 DIAGNOSIS — Z0000 Encounter for general adult medical examination without abnormal findings: Secondary | ICD-10-CM | POA: Diagnosis not present

## 2021-07-24 DIAGNOSIS — F39 Unspecified mood [affective] disorder: Secondary | ICD-10-CM | POA: Diagnosis not present

## 2021-08-04 DIAGNOSIS — F4310 Post-traumatic stress disorder, unspecified: Secondary | ICD-10-CM | POA: Diagnosis not present

## 2021-08-04 DIAGNOSIS — F4323 Adjustment disorder with mixed anxiety and depressed mood: Secondary | ICD-10-CM | POA: Diagnosis not present

## 2021-08-12 DIAGNOSIS — N3946 Mixed incontinence: Secondary | ICD-10-CM | POA: Diagnosis not present

## 2021-08-13 DIAGNOSIS — F4323 Adjustment disorder with mixed anxiety and depressed mood: Secondary | ICD-10-CM | POA: Diagnosis not present

## 2021-08-13 DIAGNOSIS — F4310 Post-traumatic stress disorder, unspecified: Secondary | ICD-10-CM | POA: Diagnosis not present

## 2021-08-18 DIAGNOSIS — F4310 Post-traumatic stress disorder, unspecified: Secondary | ICD-10-CM | POA: Diagnosis not present

## 2021-08-18 DIAGNOSIS — F4323 Adjustment disorder with mixed anxiety and depressed mood: Secondary | ICD-10-CM | POA: Diagnosis not present

## 2021-08-19 DIAGNOSIS — N3946 Mixed incontinence: Secondary | ICD-10-CM | POA: Diagnosis not present

## 2021-08-20 ENCOUNTER — Ambulatory Visit: Payer: PPO | Admitting: Podiatry

## 2021-08-20 ENCOUNTER — Ambulatory Visit (INDEPENDENT_AMBULATORY_CARE_PROVIDER_SITE_OTHER): Payer: PPO

## 2021-08-20 ENCOUNTER — Other Ambulatory Visit: Payer: Self-pay

## 2021-08-20 DIAGNOSIS — M7751 Other enthesopathy of right foot: Secondary | ICD-10-CM

## 2021-08-20 DIAGNOSIS — M79671 Pain in right foot: Secondary | ICD-10-CM | POA: Diagnosis not present

## 2021-08-20 DIAGNOSIS — M21621 Bunionette of right foot: Secondary | ICD-10-CM

## 2021-08-23 NOTE — Progress Notes (Signed)
Subjective:   Patient ID: [name] [name], female   DOB: 60 y.o.   MRN: [id number]   HPI 60 year old female presents the office with concerns of right foot pain.  She states that she gets pain to the outside aspect of the foot foot lateral fifth metatarsal head will radiate to the top of the foot.  No recent injury or trauma.  This is been intermittent for the last couple of years.  She said that when she wears tennis shoes it hurts in the boot seem to help.  Icing also helps.  No other concerns.  She is wearing gravity defining shoes.  Past Medical History:  Diagnosis Date   Allergy    Anxiety    Arthritis    low back   Asthma    Carpal tunnel syndrome 01/13/2015   Bilateral   Depression    FIBROIDS, UTERUS 10/17/2006   Qualifier: Diagnosis of  By: Jonna Munro MD, Cornelius     Fibromyalgia    HLD (hyperlipidemia)    Hyperlipidemia    Lumbar degenerative disc disease    Nerve damage    right leg   Neuromuscular disorder ([facility])    fibromyalgia   Substance abuse ([place])    recovered alcoholic   Thyroid disease    hypo thyroid    Past Surgical History:  Procedure Laterality Date   ANTERIOR CERVICAL DECOMP/DISCECTOMY FUSION     L4, L5    SHOULDER ARTHROSCOPY WITH ROTATOR CUFF REPAIR AND SUBACROMIAL DECOMPRESSION     TONSILLECTOMY     VIDEO ASSISTED THORACOSCOPY (VATS)/DECORTICATION Right 09/24/2015   Procedure: VIDEO ASSISTED THORACOSCOPY (VATS)/DECORTICATION;  Surgeon: Melrose Nakayama, MD;  Location: [place];  Service: Thoracic;  Laterality: Right;   VIDEO BRONCHOSCOPY N/A 09/24/2015   Procedure: VIDEO BRONCHOSCOPY;  Surgeon: Melrose Nakayama, MD;  Location: [place];  Service: Thoracic;  Laterality: N/A;     Current Outpatient Medications:    albuterol (PROVENTIL) (2.5 MG/3ML) 0.083% nebulizer solution, INHALE THE CONTENTS OF ONE VIAL EVERY 4 HOURS AS NEEDED, Disp: 90 mL, Rfl: 0   albuterol (VENTOLIN HFA) 108 (90 Base) MCG/ACT inhaler, INHALE 1-2 PUFFS BY MOUTH EVERY 4  HOURS AS NEEDED FOR WHEEZING, SHORTNESS OF BREATH, Disp: 8.5 g, Rfl: 3   budesonide-formoterol (SYMBICORT) 160-4.5 MCG/ACT inhaler, INHALE TWO PUFFS TWICE DAILY, Disp: 10.2 g, Rfl: 3   budesonide-formoterol (SYMBICORT) 80-4.5 MCG/ACT inhaler, Inhale 2 puffs into the lungs in the morning and at bedtime., Disp: 1 each, Rfl: 12   fluticasone (FLONASE) 50 MCG/ACT nasal spray, INSTILL 2 SPRAYS IN EACH NOSTRIL ONCE DAILY, Disp: 16 g, Rfl: 5   gabapentin (NEURONTIN) 600 MG tablet, Take 600 mg by mouth daily. , Disp: , Rfl:    ibuprofen (ADVIL) 800 MG tablet, Take 1 tablet (800 mg total) by mouth 3 (three) times daily., Disp: 21 tablet, Rfl: 0   levothyroxine (SYNTHROID, LEVOTHROID) 175 MCG tablet, TAKE 1 TABLET BY MOUTH DAILY BEFORE BREAKFAST., Disp: 90 tablet, Rfl: 3   omeprazole (PRILOSEC) 40 MG capsule, Take 1 capsule (40 mg total) by mouth daily. 1 tablet daily before dinner, Disp: 30 capsule, Rfl: 3   sertraline (ZOLOFT) 100 MG tablet, Take 100 mg by mouth daily., Disp: , Rfl:    Tour manager MISC, 1 Device as directed by Does not apply route., Disp: 1 each, Rfl: 0   tizanidine (ZANAFLEX) 2 MG capsule, Take 2 mg by mouth 3 times/day as needed-between meals & bedtime. Reported on 12/11/2015, Disp: , Rfl:  Allergies  Allergen Reactions   Celebrex [Celecoxib] Nausea And Vomiting    Severe epigastric pain   Nsaids Other (See Comments)    SEVERE EPIGASTRIC PAIN   Other Other (See Comments)   Percocet [Oxycodone-Acetaminophen] Itching         Objective:  Physical Exam  General: AAO x3, NAD  Dermatological: Skin is warm, dry and supple bilateral.  There are no open sores, no preulcerative lesions, no rash or signs of infection present.  Vascular: Dorsalis Pedis artery and Posterior Tibial artery pedal pulses are 2/4 bilateral with immedate capillary fill time. There is no pain with calf compression, swelling, warmth, erythema.   Neruologic: Grossly intact via light touch  bilateral.  Musculoskeletal: Tenderness palpation on the lateral aspect of the fifth metatarsal head on the right foot.  No pain with dorsal plantar aspect metatarsal.  There is no other areas of discomfort.  There is minimal edema to this area but no other areas.  No erythema.  Muscular strength 5/5 in all groups tested bilateral.  Gait: Unassisted, Nonantalgic.       Assessment:   Capsulitis right fifth MPJ     Plan:  -Treatment options discussed including all alternatives, risks, and complications -Etiology of symptoms were discussed -X-rays were obtained and reviewed with the patient.  Mild tailor's bunion is present.  Possible old fracture of the fifth digit. -We discussed both conservative as well as surgical treatment options.  Continue conservative management for now.  She want to proceed with a steroid injection.  Skin was prepped with Betadine, alcohol and mixture of 0.5 cc of Kenalog 10, 0.5 cc of Marcaine plain was infiltrated into the area of the fifth metatarsal phalangeal joint laterally without any complications.  Postinjection care discussed.  She tolerated the procedure well and complications. -Tailors bunion offloading pads were dispensed. -Discussed doing topical medication such as Voltaren gel. -Discussed changing shoes.  I believe the shoes that she is wearing is putting pressure on this area.  She was wearing these because of her heel pain which is resolved by things are radiating the current symptoms.  Trula Slade DPM

## 2021-08-27 DIAGNOSIS — F4323 Adjustment disorder with mixed anxiety and depressed mood: Secondary | ICD-10-CM | POA: Diagnosis not present

## 2021-08-27 DIAGNOSIS — F4310 Post-traumatic stress disorder, unspecified: Secondary | ICD-10-CM | POA: Diagnosis not present

## 2021-09-15 IMAGING — MG MM DIGITAL SCREENING BILAT W/ TOMO AND CAD
6 of 10 series · 6 of 30 positions shown · non-contrast
Comparison: Previous exam(s).

ACR Breast Density Category a: The breast tissue is almost entirely
fatty.

CLINICAL DATA: Screening.

EXAM:
DIGITAL SCREENING BILATERAL MAMMOGRAM WITH TOMOSYNTHESIS AND CAD
TECHNIQUE: Bilateral screening digital craniocaudal and mediolateral oblique
mammograms were obtained. Bilateral screening digital breast
tomosynthesis was performed. The images were evaluated with
computer-aided detection.

[R MLO synth-2D (1 of 2)]
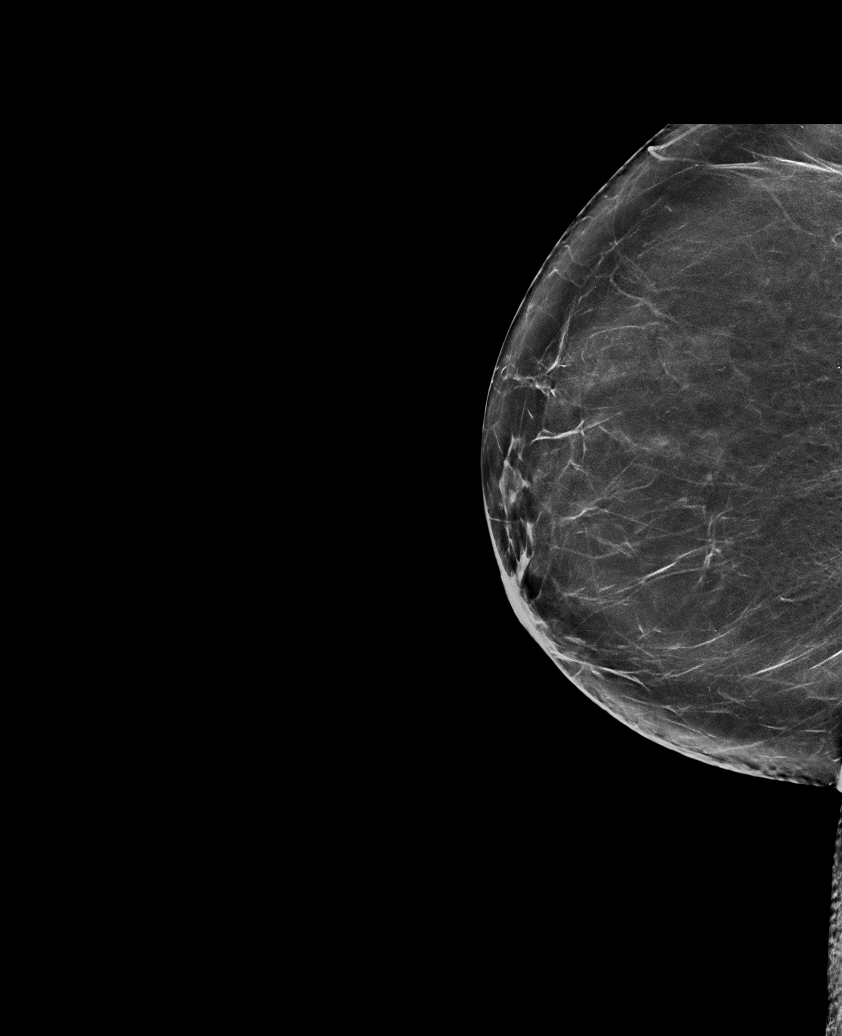

[L CC synth-2D]
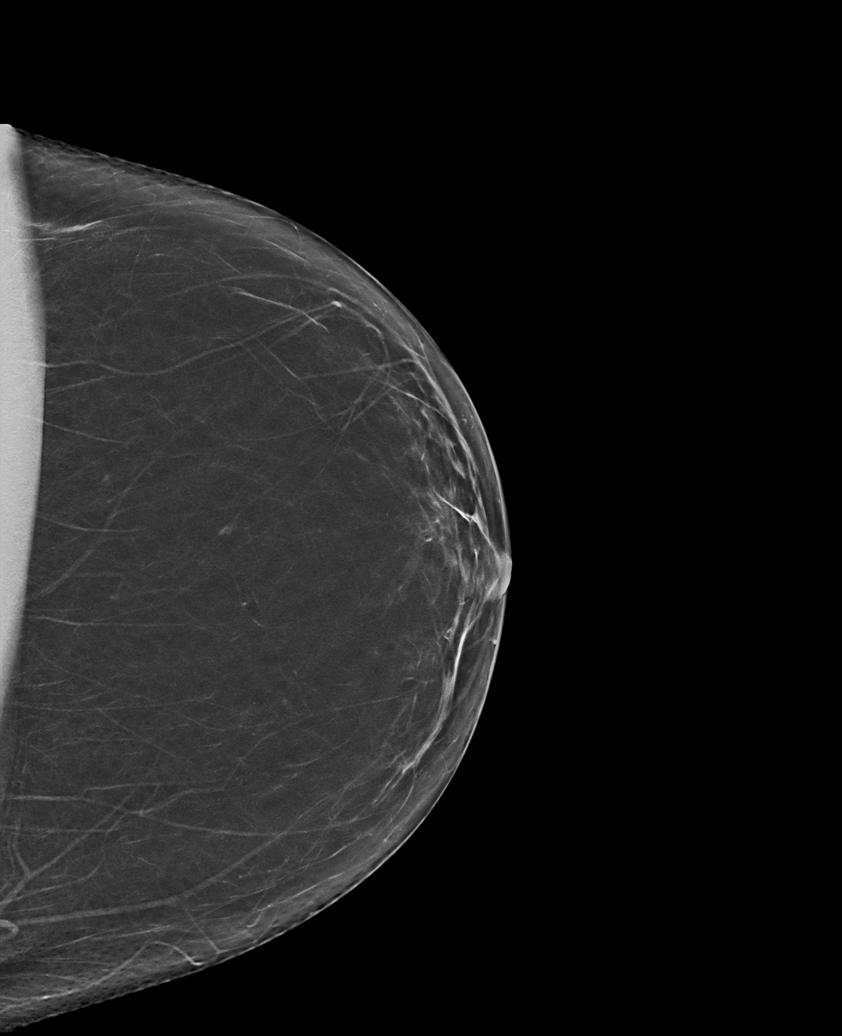

[L MLO synth-2D]
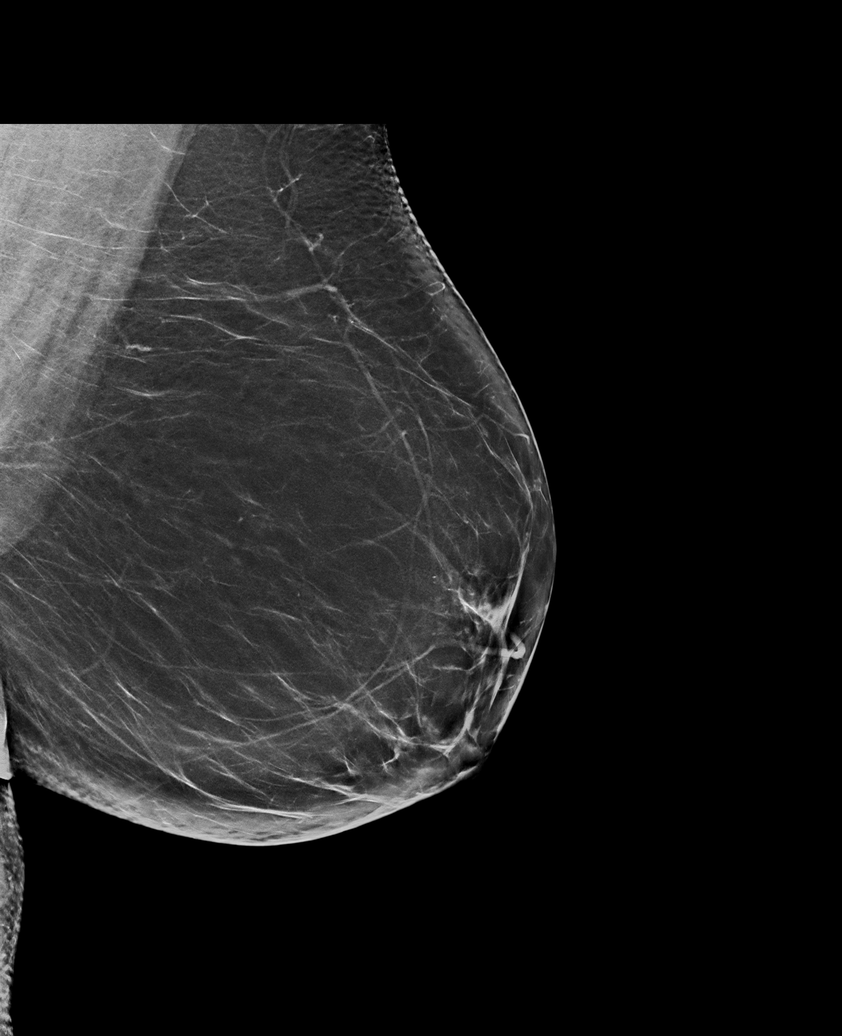

[R CC synth-2D]
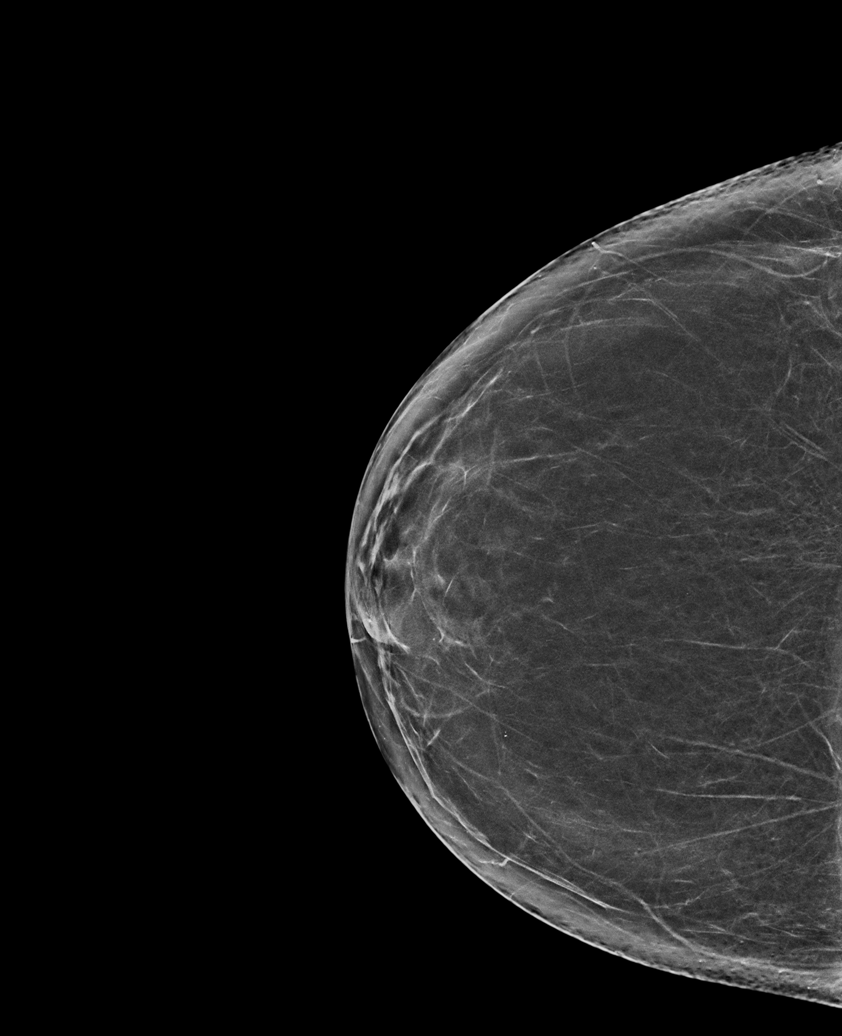

[R MLO synth-2D (2 of 2)]
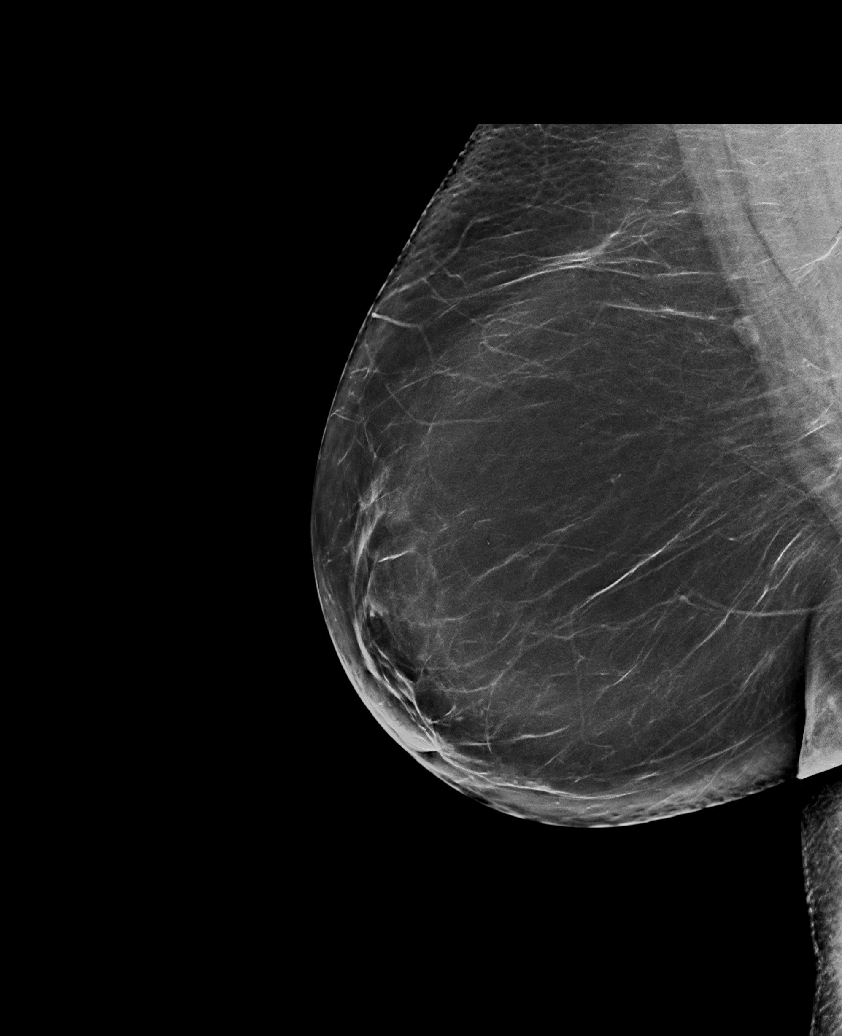

[R CC tomo · tomo slice 35/70.0]
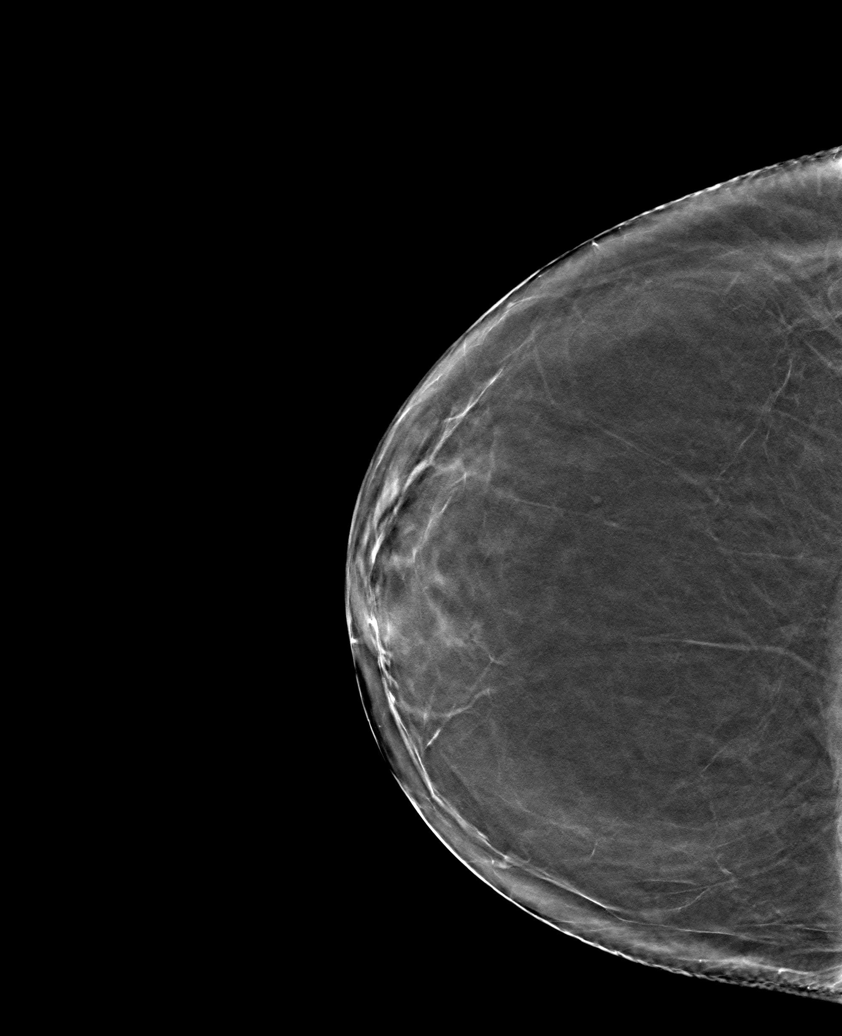

[6 of 30 positions shown; findings below may reference images not displayed]

FINDINGS: There are no findings suspicious for malignancy. The images were
evaluated with computer-aided detection.
IMPRESSION: No mammographic evidence of malignancy. A result letter of this
screening mammogram will be mailed directly to the patient.

RECOMMENDATION:
Screening mammogram in one year. (Code:JP-J-DD5)

BI-RADS CATEGORY  1: Negative.

## 2021-10-21 ENCOUNTER — Other Ambulatory Visit: Payer: Self-pay

## 2021-10-21 ENCOUNTER — Ambulatory Visit
Admission: EM | Admit: 2021-10-21 | Discharge: 2021-10-21 | Disposition: A | Discharge status: Home / Self Care | Payer: PPO | Attending: Family Medicine | Admitting: Family Medicine

## 2021-10-21 DIAGNOSIS — N898 Other specified noninflammatory disorders of vagina: Secondary | ICD-10-CM | POA: Diagnosis not present

## 2021-10-21 MED ORDER — FLUCONAZOLE 150 MG PO TABS: ORAL_TABLET | ORAL | 0 refills | Status: DC

## 2021-10-21 NOTE — ED Triage Notes (Signed)
Pt reports vaginal itching and white vaginal discharge x 1 day.

## 2021-10-22 ENCOUNTER — Telehealth (HOSPITAL_COMMUNITY): Payer: Self-pay | Admitting: Emergency Medicine

## 2021-10-22 LAB — CERVICOVAGINAL ANCILLARY ONLY
Bacterial Vaginitis (gardnerella): POSITIVE — AB
Candida Glabrata: NEGATIVE
Candida Vaginitis: POSITIVE — AB
Comment: NEGATIVE
Comment: NEGATIVE
Comment: NEGATIVE

## 2021-10-22 MED ORDER — METRONIDAZOLE 500 MG PO TABS: 500.0000 mg | ORAL_TABLET | Freq: Two times a day (BID) | ORAL | 0 refills | Status: DC

## 2021-10-24 NOTE — ED Provider Notes (Signed)
[place]   [id number] 10/21/21 Arrival Time: 7510  ASSESSMENT & PLAN:  1. Vaginal itching    Will treat empirically for yeast infection. Meds ordered this encounter  Medications   fluconazole (DIFLUCAN) 150 MG tablet    Sig: Take one tablet by mouth as a single dose. May repeat in 3 days if symptoms persist.    Dispense:  2 tablet    Refill:  0   Vaginal cytology pending. Without s/s of PID.   Follow-up Information     Caren Macadam, MD.   Specialty: Family Medicine Why: As needed. Contact information: [place] [phone]                Will notify of any positive results.   Reviewed expectations re: course of current medical issues. Questions answered. Outlined signs and symptoms indicating need for more acute intervention. Patient verbalized understanding. After Visit Summary given.   SUBJECTIVE:  [name] [name] is a 61 y.o. female who presents with complaint of vaginal itching and white discharge; x 1 day. H/O yeast infections. No concerns over STI.[name] Afebrile. No abdominal or pelvic pain. Normal PO intake wihout n/v. No genital rashes or lesions.  No treatment PTA.  Patient's last menstrual period was 10/24/2012.   OBJECTIVE:  Vitals:   10/21/21 1529  BP: 125/81  Pulse: 72  Resp: 20  Temp: 98 F (36.7 C)  TempSrc: Oral  SpO2: 95%     General appearance: alert, cooperative, appears stated age and no distress Lungs: unlabored respirations; speaks full sentences without difficulty Back: no CVA tenderness; FROM at waist Abdomen: soft GU: deferred Skin: warm and dry Psychological: alert and cooperative; normal mood and affect.  Labs Reviewed  CERVICOVAGINAL ANCILLARY ONLY - Abnormal; Notable for the following components:    Allergies  Allergen Reactions   Celecoxib Nausea And Vomiting    Severe epigastric pain Other reaction(s): Other, Unknown   Codeine Itching    Other reaction(s):  itching   Nsaids Other (See Comments)    SEVERE EPIGASTRIC PAIN   Other Other (See Comments)   Oxycodone-Acetaminophen     Other reaction(s): Unknown   Percocet [Oxycodone-Acetaminophen] Itching    Past Medical History:  Diagnosis Date   Allergy    Anxiety    Arthritis    low back   Asthma    Carpal tunnel syndrome 01/13/2015   Bilateral   Depression    FIBROIDS, UTERUS 10/17/2006   Qualifier: Diagnosis of  By: Jonna Munro MD, Cornelius     Fibromyalgia    HLD (hyperlipidemia)    Hyperlipidemia    Lumbar degenerative disc disease    Nerve damage    right leg   Neuromuscular disorder ([place])    fibromyalgia   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   Thyroid disease    hypo thyroid   Family History  Problem Relation Age of Onset   Bone cancer Father    Asthma Father    Diabetes Father    Alcohol abuse Father    Cancer Father    COPD Father    Heart disease Father    Hypertension Father    Hyperlipidemia Father    Pneumonia Mother        biological mother deceased at age 92   Depression Mother    Alcohol abuse Sister    Alcohol abuse Brother    Alcohol abuse Sister    Alcohol abuse Brother    Colon  cancer Neg Hx    Esophageal cancer Neg Hx    Stomach cancer Neg Hx    Rectal cancer Neg Hx    Social History   Socioeconomic History   Marital status: Significant Other    Spouse name: Dianna   Number of children: Not on file   Years of education: 14   Highest education level: Not on file  Occupational History   Occupation: Disabled    Comment: Electrical engineer  Tobacco Use   Smoking status: Former    Packs/day: 1.00    Types: E-cigarettes, Cigarettes    Start date: 04/16/1971    Quit date: 09/14/2007    Years since quitting: 14.1   Smokeless tobacco: Former    Types: Snuff, Chew    Quit date: 09/14/2007  Vaping Use   Vaping Use: Former   Quit date: 07/26/2017  Substance and Sexual Activity   Alcohol use: No    Alcohol/week: 0.0 standard drinks     Comment: Quit 11/11/1989   Drug use: No    Types: "Crack" cocaine, Marijuana    Comment: Quit in 1991   Sexual activity: Yes    Birth control/protection: Post-menopausal  Other Topics Concern   Not on file  Social History Narrative   Originally from [place], [place]. Previously has lived in [place], [place], [place], [place], [place], [place], [place]. She moved to [place] in 1995. 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 Currently has dogs and a cat and chickens . She has an outdoor hot tub that they use regularly. Previously had mold & mildew in their home for approximately 1 year.    Lives with [place]   [place] acres   Social Determinants of Health   Financial Resource Strain: Not on file  Food Insecurity: Not on file  Transportation Needs: Not on file  Physical Activity: Not on file  Stress: Not on file  Social Connections: Not on file  Intimate Partner Violence: Not on file           Vanessa Kick, MD 10/24/21 1324

## 2021-11-03 ENCOUNTER — Ambulatory Visit (INDEPENDENT_AMBULATORY_CARE_PROVIDER_SITE_OTHER): Payer: PPO | Admitting: Podiatry

## 2021-11-03 ENCOUNTER — Ambulatory Visit (INDEPENDENT_AMBULATORY_CARE_PROVIDER_SITE_OTHER): Payer: PPO

## 2021-11-03 ENCOUNTER — Other Ambulatory Visit: Payer: Self-pay

## 2021-11-03 DIAGNOSIS — M7751 Other enthesopathy of right foot: Secondary | ICD-10-CM

## 2021-11-03 DIAGNOSIS — M79671 Pain in right foot: Secondary | ICD-10-CM

## 2021-11-03 DIAGNOSIS — M21621 Bunionette of right foot: Secondary | ICD-10-CM | POA: Diagnosis not present

## 2021-11-03 DIAGNOSIS — M778 Other enthesopathies, not elsewhere classified: Secondary | ICD-10-CM

## 2021-11-03 NOTE — Patient Instructions (Signed)

## 2021-11-04 ENCOUNTER — Telehealth: Payer: Self-pay

## 2021-11-04 NOTE — Telephone Encounter (Signed)
[name] [name] called to cancel her surgery with Dr. Jacqualyn Posey on 11/11/2021. She stated she is not prepared to have surgery at this time. She will call back when she is ready. Notified Dr. Jacqualyn Posey and Caren Griffins with [facility]

## 2021-11-08 NOTE — Progress Notes (Signed)
Subjective: 61 year old female presents the office today for concerns of ongoing right foot pain.  This been ongoing for many years and she has tried different things to help with limiting her symptoms.  She continues to get discomfort and she wants to discuss other options.  Objective: AAO x3, NAD DP/PT pulses palpable bilaterally, CRT less than 3 seconds Tenderness is localized on the right foot along the lateral aspect of the fifth metatarsal head directly.  There is discomfort also to the plantar aspect the metatarsal head.  There is minimal edema but there is no erythema warmth or any open lesions. No pain with calf compression, swelling, warmth, erythema  Assessment: Tailor's bunion, capsulitis  Plan: -All treatment options discussed with the patient including all alternatives, risks, complications.  -Again reviewed the x-rays.  The tenderness appears to be very localized along the area of the prominence of the fifth metatarsal head laterally.  We discussed both conservative as well as surgical treatment options.  Conservative discussed continue shoe modifications, offloading.  Unfortunately last injection was not helpful.  Also discussed surgical intervention for exostectomy.  After discussion she wishes to proceed with this. -The incision placement as well as the postoperative course was discussed with the patient. I discussed risks of the surgery which include, but not limited to, infection, bleeding, pain, swelling, need for further surgery, delayed or nonhealing, painful or ugly scar, numbness or sensation changes, over/under correction, recurrence, transfer lesions, further deformity, hardware failure, DVT/PE, loss of toe/foot. Patient understands these risks and wishes to proceed with surgery. The surgical consent was reviewed with the patient all 3 pages were signed. No promises or guarantees were given to the outcome of the procedure. All questions were answered to the best of my  ability. Before the surgery the patient was encouraged to call the office if there is any further questions. The surgery will be performed at the [facility] on an outpatient basis. -Patient encouraged to call the office with any questions, concerns, change in symptoms.   Trula Slade DPM

## 2021-11-10 ENCOUNTER — Other Ambulatory Visit: Payer: Self-pay | Admitting: Podiatry

## 2021-11-10 DIAGNOSIS — M778 Other enthesopathies, not elsewhere classified: Secondary | ICD-10-CM

## 2021-11-16 ENCOUNTER — Encounter: Payer: PPO | Admitting: Podiatry

## 2021-11-26 ENCOUNTER — Encounter: Payer: PPO | Admitting: Podiatry

## 2021-12-09 DIAGNOSIS — M79671 Pain in right foot: Secondary | ICD-10-CM | POA: Diagnosis not present

## 2021-12-09 DIAGNOSIS — M25551 Pain in right hip: Secondary | ICD-10-CM | POA: Diagnosis not present

## 2021-12-10 ENCOUNTER — Encounter: Payer: PPO | Admitting: Podiatry

## 2021-12-15 DIAGNOSIS — M79671 Pain in right foot: Secondary | ICD-10-CM | POA: Diagnosis not present

## 2021-12-21 DIAGNOSIS — M25551 Pain in right hip: Secondary | ICD-10-CM | POA: Diagnosis not present

## 2021-12-21 DIAGNOSIS — M79671 Pain in right foot: Secondary | ICD-10-CM | POA: Diagnosis not present

## 2022-01-18 DIAGNOSIS — M546 Pain in thoracic spine: Secondary | ICD-10-CM | POA: Diagnosis not present

## 2022-01-18 DIAGNOSIS — M9901 Segmental and somatic dysfunction of cervical region: Secondary | ICD-10-CM | POA: Diagnosis not present

## 2022-01-18 DIAGNOSIS — M9902 Segmental and somatic dysfunction of thoracic region: Secondary | ICD-10-CM | POA: Diagnosis not present

## 2022-01-18 DIAGNOSIS — M542 Cervicalgia: Secondary | ICD-10-CM | POA: Diagnosis not present

## 2022-01-19 ENCOUNTER — Other Ambulatory Visit: Payer: Self-pay | Admitting: Pulmonary Disease

## 2022-01-20 DIAGNOSIS — R350 Frequency of micturition: Secondary | ICD-10-CM | POA: Diagnosis not present

## 2022-01-20 DIAGNOSIS — N3946 Mixed incontinence: Secondary | ICD-10-CM | POA: Diagnosis not present

## 2022-01-21 DIAGNOSIS — E039 Hypothyroidism, unspecified: Secondary | ICD-10-CM | POA: Diagnosis not present

## 2022-01-21 DIAGNOSIS — M21619 Bunion of unspecified foot: Secondary | ICD-10-CM | POA: Diagnosis not present

## 2022-01-21 DIAGNOSIS — F1911 Other psychoactive substance abuse, in remission: Secondary | ICD-10-CM | POA: Diagnosis not present

## 2022-01-21 DIAGNOSIS — G894 Chronic pain syndrome: Secondary | ICD-10-CM | POA: Diagnosis not present

## 2022-01-21 DIAGNOSIS — N393 Stress incontinence (female) (male): Secondary | ICD-10-CM | POA: Diagnosis not present

## 2022-01-21 DIAGNOSIS — J4540 Moderate persistent asthma, uncomplicated: Secondary | ICD-10-CM | POA: Diagnosis not present

## 2022-01-25 DIAGNOSIS — M542 Cervicalgia: Secondary | ICD-10-CM | POA: Diagnosis not present

## 2022-01-25 DIAGNOSIS — M546 Pain in thoracic spine: Secondary | ICD-10-CM | POA: Diagnosis not present

## 2022-01-25 DIAGNOSIS — M9902 Segmental and somatic dysfunction of thoracic region: Secondary | ICD-10-CM | POA: Diagnosis not present

## 2022-01-25 DIAGNOSIS — M9901 Segmental and somatic dysfunction of cervical region: Secondary | ICD-10-CM | POA: Diagnosis not present

## 2022-02-05 DIAGNOSIS — M9901 Segmental and somatic dysfunction of cervical region: Secondary | ICD-10-CM | POA: Diagnosis not present

## 2022-02-05 DIAGNOSIS — M9902 Segmental and somatic dysfunction of thoracic region: Secondary | ICD-10-CM | POA: Diagnosis not present

## 2022-02-05 DIAGNOSIS — M542 Cervicalgia: Secondary | ICD-10-CM | POA: Diagnosis not present

## 2022-02-05 DIAGNOSIS — M546 Pain in thoracic spine: Secondary | ICD-10-CM | POA: Diagnosis not present

## 2022-02-12 DIAGNOSIS — M9902 Segmental and somatic dysfunction of thoracic region: Secondary | ICD-10-CM | POA: Diagnosis not present

## 2022-02-12 DIAGNOSIS — M542 Cervicalgia: Secondary | ICD-10-CM | POA: Diagnosis not present

## 2022-02-12 DIAGNOSIS — M546 Pain in thoracic spine: Secondary | ICD-10-CM | POA: Diagnosis not present

## 2022-02-12 DIAGNOSIS — M9901 Segmental and somatic dysfunction of cervical region: Secondary | ICD-10-CM | POA: Diagnosis not present

## 2022-02-22 DIAGNOSIS — M9902 Segmental and somatic dysfunction of thoracic region: Secondary | ICD-10-CM | POA: Diagnosis not present

## 2022-02-22 DIAGNOSIS — M546 Pain in thoracic spine: Secondary | ICD-10-CM | POA: Diagnosis not present

## 2022-02-22 DIAGNOSIS — M9901 Segmental and somatic dysfunction of cervical region: Secondary | ICD-10-CM | POA: Diagnosis not present

## 2022-02-22 DIAGNOSIS — M542 Cervicalgia: Secondary | ICD-10-CM | POA: Diagnosis not present

## 2022-02-24 DIAGNOSIS — M79671 Pain in right foot: Secondary | ICD-10-CM | POA: Diagnosis not present

## 2022-02-24 DIAGNOSIS — M25551 Pain in right hip: Secondary | ICD-10-CM | POA: Diagnosis not present

## 2022-03-17 DIAGNOSIS — M19071 Primary osteoarthritis, right ankle and foot: Secondary | ICD-10-CM | POA: Diagnosis not present

## 2022-04-02 DIAGNOSIS — M47812 Spondylosis without myelopathy or radiculopathy, cervical region: Secondary | ICD-10-CM | POA: Diagnosis not present

## 2022-04-02 DIAGNOSIS — M47816 Spondylosis without myelopathy or radiculopathy, lumbar region: Secondary | ICD-10-CM | POA: Diagnosis not present

## 2022-04-21 DIAGNOSIS — M47816 Spondylosis without myelopathy or radiculopathy, lumbar region: Secondary | ICD-10-CM | POA: Diagnosis not present

## 2022-04-28 DIAGNOSIS — E039 Hypothyroidism, unspecified: Secondary | ICD-10-CM | POA: Diagnosis not present

## 2022-05-14 DIAGNOSIS — M47812 Spondylosis without myelopathy or radiculopathy, cervical region: Secondary | ICD-10-CM | POA: Diagnosis not present

## 2022-05-26 DIAGNOSIS — M62830 Muscle spasm of back: Secondary | ICD-10-CM | POA: Diagnosis not present

## 2022-05-26 DIAGNOSIS — M9902 Segmental and somatic dysfunction of thoracic region: Secondary | ICD-10-CM | POA: Diagnosis not present

## 2022-05-26 DIAGNOSIS — M546 Pain in thoracic spine: Secondary | ICD-10-CM | POA: Diagnosis not present

## 2022-05-26 DIAGNOSIS — M542 Cervicalgia: Secondary | ICD-10-CM | POA: Diagnosis not present

## 2022-05-26 DIAGNOSIS — M9901 Segmental and somatic dysfunction of cervical region: Secondary | ICD-10-CM | POA: Diagnosis not present

## 2022-05-26 DIAGNOSIS — M9903 Segmental and somatic dysfunction of lumbar region: Secondary | ICD-10-CM | POA: Diagnosis not present

## 2022-05-28 DIAGNOSIS — M542 Cervicalgia: Secondary | ICD-10-CM | POA: Diagnosis not present

## 2022-05-28 DIAGNOSIS — M9901 Segmental and somatic dysfunction of cervical region: Secondary | ICD-10-CM | POA: Diagnosis not present

## 2022-05-28 DIAGNOSIS — M546 Pain in thoracic spine: Secondary | ICD-10-CM | POA: Diagnosis not present

## 2022-05-28 DIAGNOSIS — M9902 Segmental and somatic dysfunction of thoracic region: Secondary | ICD-10-CM | POA: Diagnosis not present

## 2022-05-28 DIAGNOSIS — M9903 Segmental and somatic dysfunction of lumbar region: Secondary | ICD-10-CM | POA: Diagnosis not present

## 2022-05-28 DIAGNOSIS — M62830 Muscle spasm of back: Secondary | ICD-10-CM | POA: Diagnosis not present

## 2022-05-31 DIAGNOSIS — M9901 Segmental and somatic dysfunction of cervical region: Secondary | ICD-10-CM | POA: Diagnosis not present

## 2022-05-31 DIAGNOSIS — M62830 Muscle spasm of back: Secondary | ICD-10-CM | POA: Diagnosis not present

## 2022-05-31 DIAGNOSIS — M9903 Segmental and somatic dysfunction of lumbar region: Secondary | ICD-10-CM | POA: Diagnosis not present

## 2022-05-31 DIAGNOSIS — M9902 Segmental and somatic dysfunction of thoracic region: Secondary | ICD-10-CM | POA: Diagnosis not present

## 2022-05-31 DIAGNOSIS — M542 Cervicalgia: Secondary | ICD-10-CM | POA: Diagnosis not present

## 2022-05-31 DIAGNOSIS — M546 Pain in thoracic spine: Secondary | ICD-10-CM | POA: Diagnosis not present

## 2022-06-02 DIAGNOSIS — M542 Cervicalgia: Secondary | ICD-10-CM | POA: Diagnosis not present

## 2022-06-02 DIAGNOSIS — M9903 Segmental and somatic dysfunction of lumbar region: Secondary | ICD-10-CM | POA: Diagnosis not present

## 2022-06-02 DIAGNOSIS — M62830 Muscle spasm of back: Secondary | ICD-10-CM | POA: Diagnosis not present

## 2022-06-02 DIAGNOSIS — M546 Pain in thoracic spine: Secondary | ICD-10-CM | POA: Diagnosis not present

## 2022-06-02 DIAGNOSIS — M9901 Segmental and somatic dysfunction of cervical region: Secondary | ICD-10-CM | POA: Diagnosis not present

## 2022-06-02 DIAGNOSIS — M9902 Segmental and somatic dysfunction of thoracic region: Secondary | ICD-10-CM | POA: Diagnosis not present

## 2022-06-04 DIAGNOSIS — M62830 Muscle spasm of back: Secondary | ICD-10-CM | POA: Diagnosis not present

## 2022-06-04 DIAGNOSIS — M546 Pain in thoracic spine: Secondary | ICD-10-CM | POA: Diagnosis not present

## 2022-06-04 DIAGNOSIS — M9901 Segmental and somatic dysfunction of cervical region: Secondary | ICD-10-CM | POA: Diagnosis not present

## 2022-06-04 DIAGNOSIS — M9903 Segmental and somatic dysfunction of lumbar region: Secondary | ICD-10-CM | POA: Diagnosis not present

## 2022-06-04 DIAGNOSIS — M542 Cervicalgia: Secondary | ICD-10-CM | POA: Diagnosis not present

## 2022-06-04 DIAGNOSIS — M9902 Segmental and somatic dysfunction of thoracic region: Secondary | ICD-10-CM | POA: Diagnosis not present

## 2022-06-14 DIAGNOSIS — M9902 Segmental and somatic dysfunction of thoracic region: Secondary | ICD-10-CM | POA: Diagnosis not present

## 2022-06-14 DIAGNOSIS — M546 Pain in thoracic spine: Secondary | ICD-10-CM | POA: Diagnosis not present

## 2022-06-14 DIAGNOSIS — M9903 Segmental and somatic dysfunction of lumbar region: Secondary | ICD-10-CM | POA: Diagnosis not present

## 2022-06-14 DIAGNOSIS — M542 Cervicalgia: Secondary | ICD-10-CM | POA: Diagnosis not present

## 2022-06-14 DIAGNOSIS — M9901 Segmental and somatic dysfunction of cervical region: Secondary | ICD-10-CM | POA: Diagnosis not present

## 2022-06-14 DIAGNOSIS — M62830 Muscle spasm of back: Secondary | ICD-10-CM | POA: Diagnosis not present

## 2022-06-15 DIAGNOSIS — M47812 Spondylosis without myelopathy or radiculopathy, cervical region: Secondary | ICD-10-CM | POA: Diagnosis not present

## 2022-06-18 DIAGNOSIS — Z23 Encounter for immunization: Secondary | ICD-10-CM | POA: Diagnosis not present

## 2022-06-29 ENCOUNTER — Ambulatory Visit
Admission: EM | Admit: 2022-06-29 | Discharge: 2022-06-29 | Disposition: A | Discharge status: Home / Self Care | Payer: PPO | Attending: Nurse Practitioner | Admitting: Nurse Practitioner

## 2022-06-29 DIAGNOSIS — N3000 Acute cystitis without hematuria: Secondary | ICD-10-CM | POA: Diagnosis not present

## 2022-06-29 LAB — POCT URINALYSIS DIP (MANUAL ENTRY)
Bilirubin, UA: NEGATIVE
Glucose, UA: NEGATIVE mg/dL
Ketones, POC UA: NEGATIVE mg/dL
Nitrite, UA: POSITIVE — AB
Protein Ur, POC: NEGATIVE mg/dL
Spec Grav, UA: 1.01 (ref 1.010–1.025)
Urobilinogen, UA: 0.2 E.U./dL
pH, UA: 6 (ref 5.0–8.0)

## 2022-06-29 MED ORDER — CEPHALEXIN 500 MG PO CAPS
500.0000 mg | ORAL_CAPSULE | Freq: Two times a day (BID) | ORAL | 0 refills | Status: AC
Start: 2022-06-29 — End: 2022-07-06

## 2022-06-29 MED ORDER — PHENAZOPYRIDINE HCL 100 MG PO TABS: 100.0000 mg | ORAL_TABLET | Freq: Three times a day (TID) | ORAL | 0 refills | Status: DC | PRN

## 2022-06-29 NOTE — ED Triage Notes (Signed)
Pt reports burning when urinating since last night. AZO gives some relief.

## 2022-06-29 NOTE — Discharge Instructions (Addendum)
-  The urinalysis shows you have a urinary tract infection. A urine culture has been ordered to ensure you are being treated with the appropriate antibiotic.  -Take medications as prescribed. -Increase fluids. -Ibuprofen or Tylenol for pain, fever, or general discomfort. -Develop a toileting schedule that will allow you to toilet at least every 2 hours. -Avoid caffeine to include tea, soda, and coffee. -If sexually active, void at least 15 to 20 minutes after sexual intercourse. -Follow-up in the emergency department if you develop fever, chills, worsening abdominal pain, or other concerns.

## 2022-06-29 NOTE — ED Provider Notes (Signed)
[facility]    CSN: [id number] Arrival date & time: 06/29/22  1152      History   Chief Complaint Chief Complaint  Patient presents with   Dysuria    HPI NINETTE COTTA is a 61 y.o. female.   The history is provided by the patient.   Patient presents for complaints of burning with urination and difficulty emptying her bladder that started over the past 24 hours.  She denies fever, chills, abdominal pain, flank pain, hematuria, nausea, vomiting, diarrhea, or vaginal symptoms.  Patient reports that it has been quite sometime since she has had a urinary tract infection, but this is what her symptoms feel like.  Patient states that her sister gave her an Azo tablet, and after she took that, she was able to empty her bladder fully.  Denies previous history of pyelonephritis.  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   Thyroid disease    hypo thyroid    Patient Active Problem List   Diagnosis Date Noted   Acute rhinitis 08/28/2018   Trochanteric bursitis, right hip 06/15/2017   Restrictive lung disease 03/31/2017   BMI 40.0-44.9, adult ([place]) 11/30/2016   Depression with anxiety 09/20/2015   Chronic pain syndrome 09/19/2015   MALAISE AND FATIGUE 10/25/2008   Asthma 08/24/2007   Hypothyroidism 07/20/2007   CONSTIPATION NOS 10/17/2006   LOW BACK PAIN 10/17/2006    Past Surgical History:  Procedure Laterality Date   ANTERIOR CERVICAL DECOMP/DISCECTOMY FUSION     L4, L5    SHOULDER ARTHROSCOPY WITH ROTATOR CUFF REPAIR AND SUBACROMIAL  DECOMPRESSION     TONSILLECTOMY     VIDEO ASSISTED THORACOSCOPY (VATS)/DECORTICATION Right 09/24/2015   Procedure: VIDEO ASSISTED THORACOSCOPY (VATS)/DECORTICATION;  Surgeon: Melrose Nakayama, MD;  Location: [place];  Service: Thoracic;  Laterality: Right;   VIDEO BRONCHOSCOPY N/A 09/24/2015   Procedure: VIDEO BRONCHOSCOPY;  Surgeon: Melrose Nakayama, MD;  Location: [place];  Service: Thoracic;  Laterality: N/A;    OB History   No obstetric history on file.      Home Medications    Prior to Admission medications   Medication Sig Start Date End Date Taking? Authorizing Provider  cephALEXin (KEFLEX) 500 MG capsule Take 1 capsule (500 mg total) by mouth 2 (two) times daily for 7 days. 06/29/22 07/06/22 Yes Dominico Rod-Warren, Alda Lea, NP  phenazopyridine (PYRIDIUM) 100 MG tablet Take 1 tablet (100 mg total) by mouth 3 (three) times daily as needed for pain. 06/29/22  Yes Agam Tuohy-Warren, Alda Lea, NP  albuterol (PROVENTIL) (2.5 MG/3ML) 0.083% nebulizer solution INHALE THE CONTENTS OF ONE VIAL EVERY 4 HOURS AS NEEDED 03/18/21   Mannam, Praveen, MD  albuterol (VENTOLIN HFA) 108 (90 Base) MCG/ACT inhaler INHALE 1-2 PUFFS BY MOUTH EVERY 4 HOURS AS NEEDED FOR WHEEZING, SHORTNESS OF BREATH 05/26/21   Mannam, Praveen, MD  budesonide-formoterol (SYMBICORT) 160-4.5 MCG/ACT inhaler INHALE TWO PUFFS TWICE DAILY 04/17/20   Mannam,  Praveen, MD  budesonide-formoterol (SYMBICORT) 80-4.5 MCG/ACT inhaler Inhale 2 puffs into the lungs in the morning and at bedtime. 09/26/20   Mannam, Hart Robinsons, MD  fluconazole (DIFLUCAN) 150 MG tablet Take one tablet by mouth as a single dose. May repeat in 3 days if symptoms persist. 10/21/21   Vanessa Kick, MD  fluticasone (FLONASE) 50 MCG/ACT nasal spray INSTILL 2 SPRAYS IN EACH NOSTRIL ONCE DAILY 09/15/20   Mannam, Praveen, MD  gabapentin (NEURONTIN) 600 MG tablet Take 600 mg by mouth daily.     [provider]  ibuprofen (ADVIL) 800 MG tablet Take 1 tablet (800 mg total) by  mouth 3 (three) times daily. 07/16/21   Jaynee Eagles, PA-C  levothyroxine (SYNTHROID, LEVOTHROID) 175 MCG tablet TAKE 1 TABLET BY MOUTH DAILY BEFORE BREAKFAST. 07/12/17   Raylene Everts, MD  metroNIDAZOLE (FLAGYL) 500 MG tablet Take 1 tablet (500 mg total) by mouth 2 (two) times daily. 10/22/21   LampteyMyrene Galas, MD  MYRBETRIQ 50 MG TB24 tablet Take 50 mg by mouth daily. 09/24/21   [provider]  omeprazole (PRILOSEC) 40 MG capsule Take 1 capsule (40 mg total) by mouth daily. 1 tablet daily before dinner 05/20/21   Rozetta Nunnery, MD  sertraline (ZOLOFT) 100 MG tablet Take 100 mg by mouth daily. 03/14/14   [provider]  solifenacin (VESICARE) 5 MG tablet Take 5 mg by mouth daily. 06/11/22   [provider]  Spacer/Aero Chamber Mouthpiece MISC 1 Device as directed by Does not apply route. 07/26/17   Javier Glazier, MD  tizanidine (ZANAFLEX) 2 MG capsule Take 2 mg by mouth 3 times/day as needed-between meals & bedtime. Reported on 12/11/2015    [provider]    Family History Family History  Problem Relation Age of Onset   Bone cancer Father    Asthma Father    Diabetes Father    Alcohol abuse Father    Cancer Father    COPD Father    Heart disease Father    Hypertension Father    Hyperlipidemia Father    Pneumonia Mother        biological mother deceased at age 63   Depression Mother    Alcohol abuse Sister    Alcohol abuse Brother    Alcohol abuse Sister    Alcohol abuse Brother    Colon cancer Neg Hx    Esophageal cancer Neg Hx    Stomach cancer Neg Hx    Rectal cancer Neg Hx     Social History Social History   Tobacco Use   Smoking status: Former    Packs/day: 1.00    Types: E-cigarettes, Cigarettes    Start date: 04/16/1971    Quit date: 09/14/2007    Years since quitting: 14.8   Smokeless tobacco: Former    Types: Snuff, Chew    Quit date: 09/14/2007  Vaping Use   Vaping Use: Former   Quit date: 07/26/2017  Substance  Use Topics   Alcohol use: No    Alcohol/week: 0.0 standard drinks of alcohol    Comment: Quit 11/11/1989   Drug use: No    Types: "Crack" cocaine, Marijuana    Comment: Quit in 1991     Allergies   Celecoxib, Codeine, Nsaids, Other, Oxycodone-acetaminophen, and Percocet [oxycodone-acetaminophen]   Review of Systems Review of Systems Per HPI  Physical Exam Triage Vital Signs ED Triage Vitals  Enc Vitals Group     BP 06/29/22 1208 133/85  Pulse Rate 06/29/22 1208 70     Resp 06/29/22 1208 16     Temp 06/29/22 1208 98.3 F (36.8 C)     Temp Source 06/29/22 1208 Oral     SpO2 06/29/22 1208 99 %     Weight --      Height --      Head Circumference --      Peak Flow --      Pain Score 06/29/22 1206 0     Pain Loc --      Pain Edu? --      Excl. in [place]? --    No data found.  Updated Vital Signs BP 133/85 (BP Location: Right Arm)   Pulse 70   Temp 98.3 F (36.8 C) (Oral)   Resp 16   LMP 10/24/2012   SpO2 99%   Visual Acuity Right Eye Distance:   Left Eye Distance:   Bilateral Distance:    Right Eye Near:   Left Eye Near:    Bilateral Near:     Physical Exam Vitals and nursing note reviewed.  Constitutional:      General: She is not in acute distress.    Appearance: Normal appearance.  HENT:     Head: Normocephalic.  Eyes:     Extraocular Movements: Extraocular movements intact.     Conjunctiva/sclera: Conjunctivae normal.     Pupils: Pupils are equal, round, and reactive to light.  Cardiovascular:     Rate and Rhythm: Normal rate and regular rhythm.     Pulses: Normal pulses.     Heart sounds: Normal heart sounds.  Pulmonary:     Effort: Pulmonary effort is normal.     Breath sounds: Normal breath sounds.  Abdominal:     General: Bowel sounds are normal.     Palpations: Abdomen is soft.     Tenderness: There is no abdominal tenderness. There is no right CVA tenderness or left CVA tenderness.  Musculoskeletal:     Cervical back: Normal range of  motion.  Lymphadenopathy:     Cervical: No cervical adenopathy.  Skin:    General: Skin is warm and dry.  Neurological:     General: No focal deficit present.     Mental Status: She is alert and oriented to person, place, and time.  Psychiatric:        Mood and Affect: Mood normal.        Behavior: Behavior normal.      UC Treatments / Results  Labs (all labs ordered are listed, but only abnormal results are displayed) Labs Reviewed  POCT URINALYSIS DIP (MANUAL ENTRY) - Abnormal; Notable for the following components:      Result Value   Blood, UA moderate (*)    Nitrite, UA Positive (*)    Leukocytes, UA Large (3+) (*)    All other components within normal limits  URINE CULTURE  POCT URINALYSIS DIP (MANUAL ENTRY)    EKG   Radiology No results found.  Procedures Procedures (including critical care time)  Medications Ordered in UC Medications - No data to display  Initial Impression / Assessment and Plan / UC Course  I have reviewed the triage vital signs and the nursing notes.  Pertinent labs & imaging results that were available during my care of the patient were reviewed by me and considered in my medical decision making (see chart for details).  Patient presents for complaints of dysuria and difficulty with complete voiding over the past 24  hours.  Patient's vital signs are stable, she is in no acute distress.  Urinalysis is positive for nitrates and leukocytes consistent with a urinary tract infection.  Will treat patient with Keflex and Pyridium to help with her dysuria.  Supportive care recommendations were provided to the patient to include increasing fluids, voiding every 2 hours, and over-the-counter analgesics for pain or discomfort.  Patient was given indications of when to go to the emergency department.  Urine culture is pending to ensure patient is being treated with the appropriate antibiotic.  Patient verbalizes understanding.  All questions were answered.   Patient stable for discharge. Final Clinical Impressions(s) / UC Diagnoses   Final diagnoses:  Acute cystitis without hematuria     Discharge Instructions      -The urinalysis shows you have a urinary tract infection. A urine culture has been ordered to ensure you are being treated with the appropriate antibiotic.  -Take medications as prescribed. -Increase fluids. -Ibuprofen or Tylenol for pain, fever, or general discomfort. -Develop a toileting schedule that will allow you to toilet at least every 2 hours. -Avoid caffeine to include tea, soda, and coffee. -If sexually active, void at least 15 to 20 minutes after sexual intercourse. -Follow-up in the emergency department if you develop fever, chills, worsening abdominal pain, or other concerns.     ED Prescriptions     Medication Sig Dispense Auth. Provider   cephALEXin (KEFLEX) 500 MG capsule Take 1 capsule (500 mg total) by mouth 2 (two) times daily for 7 days. 14 capsule Ji Fairburn-Warren, Alda Lea, NP   phenazopyridine (PYRIDIUM) 100 MG tablet Take 1 tablet (100 mg total) by mouth 3 (three) times daily as needed for pain. 10 tablet Kamilla Hands-Warren, Alda Lea, NP      PDMP not reviewed this encounter.   Tish Men, NP 06/29/22 1239

## 2022-07-01 LAB — URINE CULTURE: Culture: 50000 — AB

## 2022-07-12 ENCOUNTER — Other Ambulatory Visit (HOSPITAL_COMMUNITY): Payer: Self-pay | Admitting: Family Medicine

## 2022-07-12 ENCOUNTER — Ambulatory Visit
Admission: EM | Admit: 2022-07-12 | Discharge: 2022-07-12 | Disposition: A | Discharge status: Home / Self Care | Payer: PPO | Attending: Family Medicine | Admitting: Family Medicine

## 2022-07-12 ENCOUNTER — Other Ambulatory Visit: Payer: Self-pay

## 2022-07-12 ENCOUNTER — Encounter: Payer: Self-pay | Admitting: Emergency Medicine

## 2022-07-12 DIAGNOSIS — N760 Acute vaginitis: Secondary | ICD-10-CM | POA: Diagnosis not present

## 2022-07-12 DIAGNOSIS — Z1231 Encounter for screening mammogram for malignant neoplasm of breast: Secondary | ICD-10-CM

## 2022-07-12 LAB — POCT URINALYSIS DIP (MANUAL ENTRY)
Bilirubin, UA: NEGATIVE
Blood, UA: NEGATIVE
Glucose, UA: NEGATIVE mg/dL
Ketones, POC UA: NEGATIVE mg/dL
Leukocytes, UA: NEGATIVE
Nitrite, UA: NEGATIVE
Protein Ur, POC: NEGATIVE mg/dL
Spec Grav, UA: 1.015 (ref 1.010–1.025)
Urobilinogen, UA: 0.2 E.U./dL
pH, UA: 7 (ref 5.0–8.0)

## 2022-07-12 MED ORDER — FLUCONAZOLE 150 MG PO TABS: 150.0000 mg | ORAL_TABLET | ORAL | 0 refills | Status: DC

## 2022-07-12 NOTE — ED Provider Notes (Signed)
[facility]    CSN: [id number] Arrival date & time: 07/12/22  1039      History   Chief Complaint Chief Complaint  Patient presents with   Vaginal Itching    HPI [name] [name] is a 61 y.o. female.   Patient presenting today with 2-day history of vaginal itching, mild discharge.  States she recently started a long course of antibiotics for urinary tract infection and symptoms started when she stopped the antibiotics.  She denies ongoing urinary symptoms but is still having some mild suprapubic pain and pressure.  Denies fever, chills, nausea, vomiting, bowel changes, concern for STDs.  Patient is postmenopausal.    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   Thyroid disease    hypo thyroid    Patient Active Problem List   Diagnosis Date Noted   Acute rhinitis 08/28/2018   Trochanteric bursitis, right hip 06/15/2017   Restrictive lung disease 03/31/2017   BMI 40.0-44.9, adult ([place]) 11/30/2016   Depression with anxiety 09/20/2015   Chronic pain syndrome 09/19/2015   MALAISE AND FATIGUE 10/25/2008   Asthma 08/24/2007   Hypothyroidism 07/20/2007   CONSTIPATION NOS 10/17/2006   LOW BACK PAIN 10/17/2006    Past Surgical History:  Procedure Laterality Date   ANTERIOR CERVICAL DECOMP/DISCECTOMY FUSION     L4, L5    SHOULDER ARTHROSCOPY WITH ROTATOR CUFF REPAIR AND SUBACROMIAL DECOMPRESSION     TONSILLECTOMY     VIDEO ASSISTED THORACOSCOPY (VATS)/DECORTICATION Right 09/24/2015   Procedure: VIDEO ASSISTED THORACOSCOPY  (VATS)/DECORTICATION;  Surgeon: Melrose Nakayama, MD;  Location: [facility] [place];  Service: Thoracic;  Laterality: Right;   VIDEO BRONCHOSCOPY N/A 09/24/2015   Procedure: VIDEO BRONCHOSCOPY;  Surgeon: Melrose Nakayama, MD;  Location: [facility];  Service: Thoracic;  Laterality: N/A;    OB History   No obstetric history on file.      Home Medications    Prior to Admission medications   Medication Sig Start Date End Date Taking? Authorizing Provider  fluconazole (DIFLUCAN) 150 MG tablet Take 1 tablet (150 mg total) by mouth every other day. 07/12/22  Yes Volney American, PA-C  albuterol (PROVENTIL) (2.5 MG/3ML) 0.083% nebulizer solution INHALE THE CONTENTS OF ONE VIAL EVERY 4 HOURS AS NEEDED 03/18/21   Mannam, Praveen, MD  albuterol (VENTOLIN HFA) 108 (90 Base) MCG/ACT inhaler INHALE 1-2 PUFFS BY MOUTH EVERY 4 HOURS AS NEEDED FOR WHEEZING, SHORTNESS OF BREATH 05/26/21   Mannam, Praveen, MD  budesonide-formoterol (SYMBICORT) 160-4.5 MCG/ACT inhaler INHALE TWO PUFFS TWICE DAILY 04/17/20   Mannam, Praveen, MD  budesonide-formoterol (SYMBICORT) 80-4.5 MCG/ACT inhaler Inhale 2 puffs into the lungs in the morning and at bedtime. 09/26/20   Mannam, Hart Robinsons, MD  fluconazole (DIFLUCAN) 150 MG tablet Take one tablet by mouth as a single dose. May repeat in 3 days if symptoms persist. Patient not taking: Reported on 07/12/2022 10/21/21   Vanessa Kick, MD  fluticasone [facility]) 50 MCG/ACT  nasal spray INSTILL 2 SPRAYS IN EACH NOSTRIL ONCE DAILY 09/15/20   Mannam, Praveen, MD  gabapentin (NEURONTIN) 600 MG tablet Take 600 mg by mouth daily.     [provider]  ibuprofen (ADVIL) 800 MG tablet Take 1 tablet (800 mg total) by mouth 3 (three) times daily. 07/16/21   Jaynee Eagles, PA-C  levothyroxine (SYNTHROID, LEVOTHROID) 175 MCG tablet TAKE 1 TABLET BY MOUTH DAILY BEFORE BREAKFAST. 07/12/17   Raylene Everts, MD  metroNIDAZOLE (FLAGYL) 500 MG tablet Take 1 tablet (500 mg total) by mouth 2 (two) times daily.  10/22/21   LampteyMyrene Galas, MD  MYRBETRIQ 50 MG TB24 tablet Take 50 mg by mouth daily. 09/24/21   [provider]  omeprazole (PRILOSEC) 40 MG capsule Take 1 capsule (40 mg total) by mouth daily. 1 tablet daily before dinner 05/20/21   Rozetta Nunnery, MD  phenazopyridine (PYRIDIUM) 100 MG tablet Take 1 tablet (100 mg total) by mouth 3 (three) times daily as needed for pain. 06/29/22   Leath-Warren, Alda Lea, NP  sertraline (ZOLOFT) 100 MG tablet Take 100 mg by mouth daily. 03/14/14   [provider]  solifenacin (VESICARE) 5 MG tablet Take 5 mg by mouth daily. 06/11/22   [provider]  Spacer/Aero Chamber Mouthpiece MISC 1 Device as directed by Does not apply route. 07/26/17   Javier Glazier, MD  tizanidine (ZANAFLEX) 2 MG capsule Take 2 mg by mouth 3 times/day as needed-between meals & bedtime. Reported on 12/11/2015    [provider]    Family History Family History  Problem Relation Age of Onset   Bone cancer Father    Asthma Father    Diabetes Father    Alcohol abuse Father    Cancer Father    COPD Father    Heart disease Father    Hypertension Father    Hyperlipidemia Father    Pneumonia Mother        biological mother deceased at age 68   Depression Mother    Alcohol abuse Sister    Alcohol abuse Brother    Alcohol abuse Sister    Alcohol abuse Brother    Colon cancer Neg Hx    Esophageal cancer Neg Hx    Stomach cancer Neg Hx    Rectal cancer Neg Hx     Social History Social History   Tobacco Use   Smoking status: Former    Packs/day: 1.00    Types: E-cigarettes, Cigarettes    Start date: 04/16/1971    Quit date: 09/14/2007    Years since quitting: 14.8   Smokeless tobacco: Former    Types: Snuff, Chew    Quit date: 09/14/2007  Vaping Use   Vaping Use: Former   Quit date: 07/26/2017  Substance Use Topics   Alcohol use: No    Alcohol/week: 0.0 standard drinks of alcohol    Comment: Quit 11/11/1989   Drug use: No     Types: "Crack" cocaine, Marijuana    Comment: Quit in 1991     Allergies   Celecoxib, Codeine, Nsaids, and Other   Review of Systems Review of Systems Per HPI  Physical Exam Triage Vital Signs ED Triage Vitals  Enc Vitals Group     BP 07/12/22 1140 122/84     Pulse Rate 07/12/22 1140 63     Resp 07/12/22 1140 20     Temp 07/12/22 1140 98.1 F (36.7 C)     Temp Source 07/12/22 1140 Oral  SpO2 07/12/22 1140 96 %     Weight --      Height --      Head Circumference --      Peak Flow --      Pain Score 07/12/22 1141 1     Pain Loc --      Pain Edu? --      Excl. in [place]? --    No data found.  Updated Vital Signs BP 122/84 (BP Location: Right Arm)   Pulse 63   Temp 98.1 F (36.7 C) (Oral)   Resp 20   LMP 10/24/2012   SpO2 96%   Visual Acuity Right Eye Distance:   Left Eye Distance:   Bilateral Distance:    Right Eye Near:   Left Eye Near:    Bilateral Near:     Physical Exam Vitals and nursing note reviewed.  Constitutional:      Appearance: Normal appearance. She is not ill-appearing.  HENT:     Head: Atraumatic.     Mouth/Throat:     Mouth: Mucous membranes are moist.  Eyes:     Extraocular Movements: Extraocular movements intact.     Conjunctiva/sclera: Conjunctivae normal.  Cardiovascular:     Rate and Rhythm: Normal rate and regular rhythm.     Heart sounds: Normal heart sounds.  Pulmonary:     Effort: Pulmonary effort is normal.     Breath sounds: Normal breath sounds.  Genitourinary:    Comments: GU exam deferred, self swab performed Musculoskeletal:        General: Normal range of motion.     Cervical back: Normal range of motion and neck supple.  Skin:    General: Skin is warm and dry.  Neurological:     Mental Status: She is alert and oriented to person, place, and time.     Motor: No weakness.     Gait: Gait normal.  Psychiatric:        Mood and Affect: Mood normal.        Thought Content: Thought content normal.         Judgment: Judgment normal.    UC Treatments / Results  Labs (all labs ordered are listed, but only abnormal results are displayed) Labs Reviewed  POCT URINALYSIS DIP (MANUAL ENTRY)  CERVICOVAGINAL ANCILLARY ONLY   EKG  Radiology No results found.  Procedures Procedures (including critical care time)  Medications Ordered in UC Medications - No data to display  Initial Impression / Assessment and Plan / UC Course  I have reviewed the triage vital signs and the nursing notes.  Pertinent labs & imaging results that were available during my care of the patient were reviewed by me and considered in my medical decision making (see chart for details).     Vitals and exam very reassuring, urinalysis without evidence of an ongoing urinary tract infection.  Vaginal swab pending, suspect yeast vaginitis based on recent antibiotic use so we will treat with Diflucan while awaiting these results.  Adjust if needed based on results.  Discussed good vaginal hygiene, probiotics and other supportive remedies.  Final Clinical Impressions(s) / UC Diagnoses   Final diagnoses:  Acute vaginitis   Discharge Instructions   None    ED Prescriptions     Medication Sig Dispense Auth. Provider   fluconazole (DIFLUCAN) 150 MG tablet Take 1 tablet (150 mg total) by mouth every other day. 3 tablet Volney American, [place]      PDMP not reviewed  this encounter.   Volney American, [place] 07/12/22 1225

## 2022-07-12 NOTE — ED Triage Notes (Signed)
Pt reports vaginal itching x2 days. Pt reports was taking abx for UTI x2 weeks. Pt reports finished abx when symptoms started. Pt also inquiring if could check to make sure UTI is gone, pt reports intermittent lower abd pain. Denies fever, nausea, dysuria.

## 2022-07-13 LAB — CERVICOVAGINAL ANCILLARY ONLY
Bacterial Vaginitis (gardnerella): NEGATIVE
Candida Glabrata: NEGATIVE
Candida Vaginitis: POSITIVE — AB
Comment: NEGATIVE
Comment: NEGATIVE
Comment: NEGATIVE

## 2022-07-22 DIAGNOSIS — M47816 Spondylosis without myelopathy or radiculopathy, lumbar region: Secondary | ICD-10-CM | POA: Diagnosis not present

## 2022-07-23 DIAGNOSIS — M47812 Spondylosis without myelopathy or radiculopathy, cervical region: Secondary | ICD-10-CM | POA: Diagnosis not present

## 2022-07-24 ENCOUNTER — Ambulatory Visit
Admission: EM | Admit: 2022-07-24 | Discharge: 2022-07-24 | Disposition: A | Discharge status: Home / Self Care | Payer: PPO | Attending: Nurse Practitioner | Admitting: Nurse Practitioner

## 2022-07-24 DIAGNOSIS — N3000 Acute cystitis without hematuria: Secondary | ICD-10-CM | POA: Diagnosis not present

## 2022-07-24 LAB — POCT URINALYSIS DIP (MANUAL ENTRY)
Bilirubin, UA: NEGATIVE
Bilirubin, UA: NEGATIVE
Glucose, UA: NEGATIVE mg/dL
Glucose, UA: NEGATIVE mg/dL
Ketones, POC UA: NEGATIVE mg/dL
Ketones, POC UA: NEGATIVE mg/dL
Nitrite, UA: NEGATIVE
Nitrite, UA: NEGATIVE
Protein Ur, POC: 100 mg/dL — AB
Protein Ur, POC: 100 mg/dL — AB
Spec Grav, UA: 1.025 (ref 1.010–1.025)
Spec Grav, UA: 1.025 (ref 1.010–1.025)
Urobilinogen, UA: 0.2 E.U./dL
Urobilinogen, UA: 0.2 E.U./dL
pH, UA: 5.5 (ref 5.0–8.0)
pH, UA: 5.5 (ref 5.0–8.0)

## 2022-07-24 MED ORDER — LEVOFLOXACIN 500 MG PO TABS
500.0000 mg | ORAL_TABLET | Freq: Two times a day (BID) | ORAL | 0 refills | Status: AC
Start: 2022-07-24 — End: 2022-07-31

## 2022-07-24 MED ORDER — FLUCONAZOLE 150 MG PO TABS: 150.0000 mg | ORAL_TABLET | Freq: Every day | ORAL | 0 refills | Status: DC

## 2022-07-24 MED ORDER — PHENAZOPYRIDINE HCL 100 MG PO TABS: 100.0000 mg | ORAL_TABLET | Freq: Three times a day (TID) | ORAL | 0 refills | Status: DC | PRN

## 2022-07-24 NOTE — Discharge Instructions (Signed)
-  The urinalysis shows you most likely have a urinary tract infection.  A urine culture has been ordered to ensure you are being treated with the appropriate antibiotic. -Take medications as prescribed. -Increase fluids. -Ibuprofen or Tylenol for pain, fever, or general discomfort. -Develop a toileting schedule that will allow you to toilet at least every 2 hours. -Avoid caffeine to include tea, soda, and coffee. -If sexually active, void at least 15 to 20 minutes after sexual intercourse. -Follow-up in the emergency department if you develop fever, chills, worsening abdominal pain, or other concerns.

## 2022-07-24 NOTE — ED Provider Notes (Signed)
[facility]    CSN: [id number] Arrival date & time: 07/24/22  1516      History   Chief Complaint No chief complaint on file.   HPI [name] [name] is a 61 y.o. female.   The history is provided by the patient.   Patient presents with a 1 day history of urinary symptoms.  Patient presents for complaints of pain with urination, suprapubic pain and pressure, frequency, urgency, and decreased urine flow.  Patient states that she recently was treated for urinary tract infection on 06/29/2022 with Keflex.  She states that she later developed a yeast infection after completing the antibiotic.  She states at that time, her urine was checked, which did not show a urinary tract infection.  She states that she does take a medication to help with nighttime incontinence, and worries if this may be causing her symptoms to recur.  She denies fever, chills, vomiting, diarrhea, or constipation.  Patient states she has been taking over-the-counter Azo for her symptoms which initially provided some relief, but that is no longer.  She also states that she developed a yeast infection after completing the previous antibiotic prescribed, she is requesting fluconazole if she is prescribed an antibiotic today.  Past Medical History:  Diagnosis Date   Allergy    Anxiety    Arthritis    low back   Asthma    Carpal tunnel syndrome 01/13/2015   Bilateral   Depression    FIBROIDS, UTERUS 10/17/2006   Qualifier: Diagnosis of  By: Erby Pian MD, Cornelius     Fibromyalgia    HLD (hyperlipidemia)    Hyperlipidemia    Lumbar degenerative disc disease    Nerve damage    right leg   Neuromuscular disorder (HCC)    fibromyalgia   Substance abuse (HCC)    recovered alcoholic   Thyroid disease    hypo thyroid    Patient Active Problem List   Diagnosis Date Noted   Acute rhinitis 08/28/2018   Trochanteric bursitis, right hip 06/15/2017   Restrictive lung disease 03/31/2017   BMI 40.0-44.9,  adult (HCC) 11/30/2016   Depression with anxiety 09/20/2015   Chronic pain syndrome 09/19/2015   MALAISE AND FATIGUE 10/25/2008   Asthma 08/24/2007   Hypothyroidism 07/20/2007   CONSTIPATION NOS 10/17/2006   LOW BACK PAIN 10/17/2006    Past Surgical History:  Procedure Laterality Date   ANTERIOR CERVICAL DECOMP/DISCECTOMY FUSION     L4, L5    SHOULDER ARTHROSCOPY WITH ROTATOR CUFF REPAIR AND SUBACROMIAL DECOMPRESSION     TONSILLECTOMY     VIDEO ASSISTED THORACOSCOPY (VATS)/DECORTICATION Right 09/24/2015   Procedure: VIDEO ASSISTED THORACOSCOPY (VATS)/DECORTICATION;  Surgeon: Loreli Slot, MD;  Location: [facility] OR;  Service: Thoracic;  Laterality: Right;   VIDEO BRONCHOSCOPY N/A 09/24/2015   Procedure: VIDEO BRONCHOSCOPY;  Surgeon: Loreli Slot, MD;  Location: [facility] OR;  Service: Thoracic;  Laterality: N/A;    OB History   No obstetric history on file.      Home Medications    Prior to Admission medications   Medication Sig Start Date End Date Taking? Authorizing Provider  fluconazole (DIFLUCAN) 150 MG tablet Take 1 tablet (150 mg total) by mouth daily. Take 1 tablet when you start experiencing symptoms.  May take the second tablet when you complete the antibiotic. 07/24/22  Yes Leath-Warren, Sadie Haber, NP  levofloxacin (LEVAQUIN) 500 MG tablet Take 1 tablet (500 mg total) by mouth 2 (two) times daily for 7 days. 07/24/22  07/31/22 Yes Leath-Warren, Sadie Haber, NP  phenazopyridine (PYRIDIUM) 100 MG tablet Take 1 tablet (100 mg total) by mouth 3 (three) times daily as needed for pain. 07/24/22  Yes Leath-Warren, Sadie Haber, NP  albuterol (PROVENTIL) (2.5 MG/3ML) 0.083% nebulizer solution INHALE THE CONTENTS OF ONE VIAL EVERY 4 HOURS AS NEEDED 03/18/21   Mannam, Praveen, MD  albuterol (VENTOLIN HFA) 108 (90 Base) MCG/ACT inhaler INHALE 1-2 PUFFS BY MOUTH EVERY 4 HOURS AS NEEDED FOR WHEEZING, SHORTNESS OF BREATH 05/26/21   Mannam, Praveen, MD  budesonide-formoterol (SYMBICORT)  160-4.5 MCG/ACT inhaler INHALE TWO PUFFS TWICE DAILY 04/17/20   Mannam, Praveen, MD  budesonide-formoterol (SYMBICORT) 80-4.5 MCG/ACT inhaler Inhale 2 puffs into the lungs in the morning and at bedtime. 09/26/20   Mannam, Colbert Coyer, MD  fluticasone (FLONASE) 50 MCG/ACT nasal spray INSTILL 2 SPRAYS IN EACH NOSTRIL ONCE DAILY 09/15/20   Mannam, Praveen, MD  gabapentin (NEURONTIN) 600 MG tablet Take 600 mg by mouth daily.     [provider]  ibuprofen (ADVIL) 800 MG tablet Take 1 tablet (800 mg total) by mouth 3 (three) times daily. 07/16/21   Wallis Bamberg, PA-C  levothyroxine (SYNTHROID, LEVOTHROID) 175 MCG tablet TAKE 1 TABLET BY MOUTH DAILY BEFORE BREAKFAST. 07/12/17   Eustace Moore, MD  metroNIDAZOLE (FLAGYL) 500 MG tablet Take 1 tablet (500 mg total) by mouth 2 (two) times daily. 10/22/21   LampteyBritta Mccreedy, MD  MYRBETRIQ 50 MG TB24 tablet Take 50 mg by mouth daily. 09/24/21   [provider]  omeprazole (PRILOSEC) 40 MG capsule Take 1 capsule (40 mg total) by mouth daily. 1 tablet daily before dinner 05/20/21   Drema Halon, MD  sertraline (ZOLOFT) 100 MG tablet Take 100 mg by mouth daily. 03/14/14   [provider]  solifenacin (VESICARE) 5 MG tablet Take 5 mg by mouth daily. 06/11/22   [provider]  Spacer/Aero Chamber Mouthpiece MISC 1 Device as directed by Does not apply route. 07/26/17   Roslynn Amble, MD  tizanidine (ZANAFLEX) 2 MG capsule Take 2 mg by mouth 3 times/day as needed-between meals & bedtime. Reported on 12/11/2015    [provider]    Family History Family History  Problem Relation Age of Onset   Bone cancer Father    Asthma Father    Diabetes Father    Alcohol abuse Father    Cancer Father    COPD Father    Heart disease Father    Hypertension Father    Hyperlipidemia Father    Pneumonia Mother        biological mother deceased at age 34   Depression Mother    Alcohol abuse Sister    Alcohol abuse Brother     Alcohol abuse Sister    Alcohol abuse Brother    Colon cancer Neg Hx    Esophageal cancer Neg Hx    Stomach cancer Neg Hx    Rectal cancer Neg Hx     Social History Social History   Tobacco Use   Smoking status: Former    Packs/day: 1.00    Types: E-cigarettes, Cigarettes    Start date: 04/16/1971    Quit date: 09/14/2007    Years since quitting: 14.8   Smokeless tobacco: Former    Types: Snuff, Chew    Quit date: 09/14/2007  Vaping Use   Vaping Use: Former   Quit date: 07/26/2017  Substance Use Topics   Alcohol use: No    Alcohol/week: 0.0 standard drinks  of alcohol    Comment: Quit 11/11/1989   Drug use: No    Types: "Crack" cocaine, Marijuana    Comment: Quit in 1991     Allergies   Celecoxib, Codeine, Nsaids, and Other   Review of Systems Review of Systems Per HPI  Physical Exam Triage Vital Signs ED Triage Vitals  Enc Vitals Group     BP 07/24/22 1524 (!) 146/83     Pulse Rate 07/24/22 1524 67     Resp 07/24/22 1524 18     Temp 07/24/22 1524 98 F (36.7 C)     Temp Source 07/24/22 1524 Oral     SpO2 07/24/22 1524 98 %     Weight --      Height --      Head Circumference --      Peak Flow --      Pain Score 07/24/22 1529 7     Pain Loc --      Pain Edu? --      Excl. in GC? --    No data found.  Updated Vital Signs BP (!) 146/83 (BP Location: Right Arm)   Pulse 67   Temp 98 F (36.7 C) (Oral)   Resp 18   LMP 10/24/2012   SpO2 98%   Visual Acuity Right Eye Distance:   Left Eye Distance:   Bilateral Distance:    Right Eye Near:   Left Eye Near:    Bilateral Near:     Physical Exam Vitals and nursing note reviewed.  Constitutional:      General: She is not in acute distress.    Appearance: Normal appearance.  Eyes:     Extraocular Movements: Extraocular movements intact.     Pupils: Pupils are equal, round, and reactive to light.  Cardiovascular:     Rate and Rhythm: Normal rate and regular rhythm.     Pulses: Normal pulses.      Heart sounds: Normal heart sounds.  Pulmonary:     Effort: Pulmonary effort is normal. No respiratory distress.     Breath sounds: Normal breath sounds. No stridor. No wheezing, rhonchi or rales.  Abdominal:     General: Bowel sounds are normal.     Palpations: Abdomen is soft.     Tenderness: There is abdominal tenderness in the suprapubic area. There is no right CVA tenderness or left CVA tenderness.  Skin:    General: Skin is warm and dry.  Neurological:     General: No focal deficit present.     Mental Status: She is alert and oriented to person, place, and time.  Psychiatric:        Mood and Affect: Mood normal.      UC Treatments / Results  Labs (all labs ordered are listed, but only abnormal results are displayed) Labs Reviewed  POCT URINALYSIS DIP (MANUAL ENTRY) - Abnormal; Notable for the following components:      Result Value   Clarity, UA cloudy (*)    Blood, UA large (*)    Protein Ur, POC =100 (*)    Leukocytes, UA Large (3+) (*)    All other components within normal limits  URINE CULTURE    EKG   Radiology No results found.  Procedures Procedures (including critical care time)  Medications Ordered in UC Medications - No data to display  Initial Impression / Assessment and Plan / UC Course  I have reviewed the triage vital signs and the nursing notes.  Pertinent  labs & imaging results that were available during my care of the patient were reviewed by me and considered in my medical decision making (see chart for details).  Patient presents for urinary symptoms that started within the past 24 hours.  On exam, patient's vital signs are stable, although she is mildly hypertensive, she is in no acute distress and well-appearing.  Urinalysis is positive for large leukocytes, large blood, and protein.  These findings are suspicious for urinary tract infection.  Will treat patient with Levaquin 750 mg given her allergy history.  We will also provide a  prescription for Pyridium 100 mg to help with dysuria.  There is concern that she was just recently treated for urinary tract infection within the past 2 to 3 weeks.  Discussion with patient regarding possible causes.  She states that she does take medication at night to help with nocturia.  Patient was advised to follow-up with her primary care physician regarding the use of this medication.  Urine culture is pending to ensure patient is being treated with the correct antibiotic.  Patient was also prescribed fluconazole 150 mg to help with the possible yeast infection symptoms after taking antibiotic.  Supportive care recommendations were provided to the patient along with strict ER precautions.  Patient verbalizes understanding.  All questions were answered.  Patient stable for discharge. Final Clinical Impressions(s) / UC Diagnoses   Final diagnoses:  Acute cystitis without hematuria     Discharge Instructions      -The urinalysis shows you most likely have a urinary tract infection.  A urine culture has been ordered to ensure you are being treated with the appropriate antibiotic. -Take medications as prescribed. -Increase fluids. -Ibuprofen or Tylenol for pain, fever, or general discomfort. -Develop a toileting schedule that will allow you to toilet at least every 2 hours. -Avoid caffeine to include tea, soda, and coffee. -If sexually active, void at least 15 to 20 minutes after sexual intercourse. -Follow-up in the emergency department if you develop fever, chills, worsening abdominal pain, or other concerns.      ED Prescriptions     Medication Sig Dispense Auth. Provider   levofloxacin (LEVAQUIN) 500 MG tablet Take 1 tablet (500 mg total) by mouth 2 (two) times daily for 7 days. 14 tablet Leath-Warren, Sadie Haber, NP   fluconazole (DIFLUCAN) 150 MG tablet Take 1 tablet (150 mg total) by mouth daily. Take 1 tablet when you start experiencing symptoms.  May take the second tablet when  you complete the antibiotic. 2 tablet Leath-Warren, Sadie Haber, NP   phenazopyridine (PYRIDIUM) 100 MG tablet Take 1 tablet (100 mg total) by mouth 3 (three) times daily as needed for pain. 10 tablet Leath-Warren, Sadie Haber, NP      PDMP not reviewed this encounter.   Abran Cantor, NP 07/25/22 [phone]

## 2022-07-24 NOTE — ED Triage Notes (Incomplete)
Pt reports burning when urinating, frequent urination, lower back, and lower abdominal pain. She has a spot of blood when wiping.   Has a new partner of 3 months  uti for 3 weeks now.  Just finished keflex for uti and 2 days later she got a yeast infection. Took diflucan finished that med then got another uti.

## 2022-07-25 ENCOUNTER — Encounter: Payer: Self-pay | Admitting: Nurse Practitioner

## 2022-07-27 LAB — URINE CULTURE: Culture: >=100000 — AB

## 2022-07-28 DIAGNOSIS — N3020 Other chronic cystitis without hematuria: Secondary | ICD-10-CM | POA: Diagnosis not present

## 2022-07-28 DIAGNOSIS — R8271 Bacteriuria: Secondary | ICD-10-CM | POA: Diagnosis not present

## 2022-07-29 ENCOUNTER — Other Ambulatory Visit (HOSPITAL_COMMUNITY): Payer: Self-pay | Admitting: Neurosurgery

## 2022-07-29 DIAGNOSIS — M542 Cervicalgia: Secondary | ICD-10-CM | POA: Diagnosis not present

## 2022-07-29 DIAGNOSIS — M5440 Lumbago with sciatica, unspecified side: Secondary | ICD-10-CM | POA: Diagnosis not present

## 2022-07-29 DIAGNOSIS — Z6834 Body mass index (BMI) 34.0-34.9, adult: Secondary | ICD-10-CM | POA: Diagnosis not present

## 2022-07-29 DIAGNOSIS — M5412 Radiculopathy, cervical region: Secondary | ICD-10-CM | POA: Diagnosis not present

## 2022-08-06 ENCOUNTER — Ambulatory Visit (HOSPITAL_COMMUNITY): Payer: PPO

## 2022-08-10 DIAGNOSIS — E039 Hypothyroidism, unspecified: Secondary | ICD-10-CM | POA: Diagnosis not present

## 2022-08-10 DIAGNOSIS — G894 Chronic pain syndrome: Secondary | ICD-10-CM | POA: Diagnosis not present

## 2022-08-10 DIAGNOSIS — R7303 Prediabetes: Secondary | ICD-10-CM | POA: Diagnosis not present

## 2022-08-10 DIAGNOSIS — Z0000 Encounter for general adult medical examination without abnormal findings: Secondary | ICD-10-CM | POA: Diagnosis not present

## 2022-08-10 DIAGNOSIS — E7800 Pure hypercholesterolemia, unspecified: Secondary | ICD-10-CM | POA: Diagnosis not present

## 2022-08-10 DIAGNOSIS — Z79899 Other long term (current) drug therapy: Secondary | ICD-10-CM | POA: Diagnosis not present

## 2022-08-10 DIAGNOSIS — Z1159 Encounter for screening for other viral diseases: Secondary | ICD-10-CM | POA: Diagnosis not present

## 2022-08-10 DIAGNOSIS — Z23 Encounter for immunization: Secondary | ICD-10-CM | POA: Diagnosis not present

## 2022-08-10 DIAGNOSIS — R072 Precordial pain: Secondary | ICD-10-CM | POA: Diagnosis not present

## 2022-08-13 ENCOUNTER — Ambulatory Visit (HOSPITAL_COMMUNITY)
Admission: RE | Admit: 2022-08-13 | Discharge: 2022-08-13 | Disposition: A | Discharge status: Home / Self Care | Payer: PPO | Source: Ambulatory Visit | Attending: Family Medicine | Admitting: Family Medicine

## 2022-08-13 DIAGNOSIS — Z1231 Encounter for screening mammogram for malignant neoplasm of breast: Secondary | ICD-10-CM

## 2022-08-17 ENCOUNTER — Ambulatory Visit: Payer: PPO | Admitting: Cardiology

## 2022-08-17 ENCOUNTER — Encounter: Payer: Self-pay | Admitting: Cardiology

## 2022-08-17 VITALS — BP 125/77 | HR 61 | Ht 66.0 in | Wt 217.2 lb

## 2022-08-17 DIAGNOSIS — J439 Emphysema, unspecified: Secondary | ICD-10-CM

## 2022-08-17 DIAGNOSIS — E7800 Pure hypercholesterolemia, unspecified: Secondary | ICD-10-CM | POA: Diagnosis not present

## 2022-08-17 DIAGNOSIS — Z87891 Personal history of nicotine dependence: Secondary | ICD-10-CM | POA: Diagnosis not present

## 2022-08-17 DIAGNOSIS — R072 Precordial pain: Secondary | ICD-10-CM | POA: Diagnosis not present

## 2022-08-17 NOTE — Progress Notes (Signed)
ID:  [name] [name], DOB [birth year], MRN [id number]  PCP:  [name] Macadam, MD  Cardiologist:  Rex Kras, DO, [facility] (established care 08/17/2022)  REASON FOR CONSULT: Precordial pain  REQUESTING PHYSICIAN:  [name] Macadam, MD [address],  [place] [address]  Chief Complaint  Patient presents with   New Patient (Initial Visit)    Chest pain   Chest Pain    HPI  [name] [name] is a 61 y.o. Caucasian female who presents to the clinic for evaluation of precordial pain at the request of [name] Macadam, MD. Her past medical history and cardiovascular risk factors include: hx of right sided empyema,Hypothyroidism, asthma, anxiety/depression, COPD/emphysema, hypercholesterolemia, former smoker, history of substance abuse /alcohol abuse.  Patient referred to the practice for evaluation of precordial pain.  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  During many of these panic attacks she experiences precordial chest pain, substernally located, pressure-like feeling, lasting for few hours, nonradiating, not brought on by effort related activities, but and does resolve with rest.  Patient states that she followed up with her PCP and was referred to cardiology for further evaluation and management.  In addition, she had routine labs which notes uncontrolled cholesterol levels.  FUNCTIONAL STATUS: No structured exercise program or daily routine.   ALLERGIES: Allergies  Allergen Reactions   Celecoxib Nausea And Vomiting    Severe epigastric pain Other reaction(s): Other, Unknown Other reaction(s): other   Codeine Itching    Other reaction(s): itching   Nsaids Other (See Comments)    SEVERE EPIGASTRIC PAIN   Other Other (See Comments)    MEDICATION LIST PRIOR TO VISIT: Current Meds  Medication Sig   albuterol (PROVENTIL) (2.5 MG/3ML)  0.083% nebulizer solution INHALE THE CONTENTS OF ONE VIAL EVERY 4 HOURS AS NEEDED   albuterol (VENTOLIN HFA) 108 (90 Base) MCG/ACT inhaler INHALE 1-2 PUFFS BY MOUTH EVERY 4 HOURS AS NEEDED FOR WHEEZING, SHORTNESS OF BREATH   budesonide-formoterol (SYMBICORT) 160-4.5 MCG/ACT inhaler INHALE TWO PUFFS TWICE DAILY   diazepam (VALIUM) 5 MG tablet Take 5 mg by mouth 2 (two) times daily as needed.   fluticasone (FLONASE) 50 MCG/ACT nasal spray INSTILL 2 SPRAYS IN EACH NOSTRIL ONCE DAILY (Patient taking differently: as needed.)   gabapentin (NEURONTIN) 600 MG tablet Take 600 mg by mouth daily.    ibuprofen (ADVIL) 800 MG tablet Take 1 tablet (800 mg total) by mouth 3 (three) times daily.   levothyroxine (SYNTHROID, LEVOTHROID) 175 MCG tablet TAKE 1 TABLET BY MOUTH DAILY BEFORE BREAKFAST. (Patient taking differently: 137 mcg. TAKE 1 TABLET BY MOUTH DAILY BEFORE BREAKFAST.)   nitrofurantoin, macrocrystal-monohydrate, (MACROBID) 100 MG capsule Take 100 mg by mouth at bedtime.   omeprazole (PRILOSEC) 40 MG capsule Take 1 capsule (40 mg total) by mouth daily. 1 tablet daily before dinner   solifenacin (VESICARE) 5 MG tablet Take 5 mg by mouth daily.   tizanidine (ZANAFLEX) 2 MG capsule Take 2 mg by mouth 3 times/day as needed-between meals & bedtime. 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   Thyroid disease    hypo thyroid    PAST SURGICAL HISTORY: Past Surgical History:  Procedure Laterality Date   ANTERIOR CERVICAL  DECOMP/DISCECTOMY FUSION     L4, L5    SHOULDER ARTHROSCOPY WITH ROTATOR CUFF REPAIR AND SUBACROMIAL DECOMPRESSION     TONSILLECTOMY     VIDEO ASSISTED THORACOSCOPY (VATS)/DECORTICATION Right 09/24/2015   Procedure: VIDEO ASSISTED THORACOSCOPY (VATS)/DECORTICATION;  Surgeon: Melrose Nakayama, MD;  Location: [place];  Service: Thoracic;  Laterality: Right;   VIDEO BRONCHOSCOPY N/A 09/24/2015   Procedure: VIDEO BRONCHOSCOPY;  Surgeon: Melrose Nakayama, MD;  Location: [place];  Service: Thoracic;  Laterality: N/A;    FAMILY HISTORY: The patient family history includes Alcohol abuse in her brother, brother, father, sister, and sister; Asthma in her father; Bone cancer in her father; COPD in her father; Cancer in her father; Depression in her mother; Diabetes in her father; Heart disease in her father; Hyperlipidemia in her father; Hypertension in her father; Pneumonia in her mother.  SOCIAL HISTORY:  The patient  reports that she quit smoking about 14 years ago. Her smoking use included e-cigarettes and cigarettes. She started smoking about 51 years ago. She smoked an average of 1 pack per day. She quit smokeless tobacco use about 14 years ago.  Her smokeless tobacco use included snuff and chew. She reports that she does not drink alcohol and does not use drugs.  REVIEW OF SYSTEMS: Review of Systems  Cardiovascular:  Positive for chest pain. Negative for claudication, dyspnea on exertion, irregular heartbeat, leg swelling, near-syncope, orthopnea, palpitations, paroxysmal nocturnal dyspnea and syncope.  Respiratory:  Negative for shortness of breath.   Hematologic/Lymphatic: Negative for bleeding problem.  Musculoskeletal:  Negative for muscle cramps and myalgias.  Neurological:  Negative for dizziness and light-headedness.  Psychiatric/Behavioral:  The patient is nervous/anxious.        Panic attacks   PHYSICAL EXAM:    08/17/2022   12:46 PM 07/24/2022    3:24 PM 07/12/2022   11:40 AM   Vitals with BMI  Height '5\' 6"'$     Weight 217 lbs 3 oz    BMI 93.26    Systolic 712 458 099  Diastolic 77 83 84  Pulse 61 67 63    Physical Exam  Constitutional: No distress.  Age appropriate, hemodynamically stable.   Neck: No JVD present.  Cardiovascular: Normal rate, regular rhythm, S1 normal, S2 normal, intact distal pulses and normal pulses. Exam reveals no gallop, no S3 and no S4.  No murmur heard. Pulmonary/Chest: Effort normal and breath sounds normal. No stridor. She has no wheezes. She has no rales.  Abdominal: Soft. Bowel sounds are normal. She exhibits no distension. There is no abdominal tenderness.  Musculoskeletal:        General: No edema.     Cervical back: Neck supple.  Neurological: She is alert and oriented to person, place, and time. She has intact cranial nerves (2-12).  Skin: Skin is warm and moist.   CARDIAC DATABASE: EKG: 08/17/2022: Normal sinus rhythm, 60 bpm, without underlying ischemia or injury pattern.  Echocardiogram: No results found for this or any previous visit from the past 1095 days.  Stress Testing: No results found for this or any previous visit from the past 1095 days.   Heart Catheterization: None  LABORATORY DATA: External Labs: Collected: 08/10/2022. Hemoglobin 14.5 g/dL, hematocrit 44.1% TSH 2.72  IMPRESSION:    ICD-10-CM   1. Precordial pain  R07.2 EKG 12-Lead    PCV ECHOCARDIOGRAM COMPLETE    PCV CARDIAC STRESS TEST    CT CARDIAC SCORING ([facility] LOCATIONS ONLY)    2. Pure hypercholesterolemia  E78.00     3. Former smoker  Z87.891     4. Pulmonary emphysema, unspecified emphysema type ([place])  J43.9        RECOMMENDATIONS: JAZMYNN PHO is a 61 y.o. Caucasian female whose past medical history and cardiac risk factors include: hx of right sided empyema,Hypothyroidism, asthma, anxiety/depression, COPD/emphysema, hypercholesterolemia, former smoker, history of substance abuse /alcohol abuse.  Precordial  pain With cardiac and noncardiac features. EKG nonischemic. No active pain. Educated on the importance of stress management/treatment for panic attacks. Given her history of smoking, substance abuse, hypercholesterolemia recommend ischemic workup. Echo will be ordered to evaluate for structural heart disease and left ventricular systolic function. Exercise treadmill stress test to evaluate functional capacity, exercise-induced arrhythmia/ischemia. And coronary artery calcium score for risk stratification. If she has an elevated CAC score will consider transitioning to a GXT to exercise nuclear stress test. In the interim if her discomfort increase in intensity, frequency, and/or duration she is asked to go to the closest ER via EMS for further evaluation and management.  Pure hypercholesterolemia Patient states that is likely secondary to her keto diet. She wants to implement lifestyle changes and have it reevaluated in 3 months with her PCP. Will obtain fasting lipid profile labs from PCP as they were recently checked.  Labs requested  Former smoker Reemphasized the importance of continued complete smoking cessation  Pulmonary emphysema, unspecified emphysema type ([place]) Chronic and stable. Defer management to pulmonary medicine.  FINAL MEDICATION LIST END OF ENCOUNTER: No orders of the defined types were placed in this encounter.   Medications Discontinued During This Encounter  Medication Reason   budesonide-formoterol (SYMBICORT) 80-4.5 MCG/ACT inhaler Duplicate   fluconazole (DIFLUCAN) 150 MG tablet Completed Course   metroNIDAZOLE (FLAGYL) 500 MG tablet Completed Course   MYRBETRIQ 50 MG TB24 tablet Completed Course   phenazopyridine (PYRIDIUM) 100 MG tablet Completed Course     Current Outpatient Medications:    albuterol (PROVENTIL) (2.5 MG/3ML) 0.083% nebulizer solution, INHALE THE CONTENTS OF ONE VIAL EVERY 4 HOURS AS NEEDED, Disp: 90 mL, Rfl: 0   albuterol (VENTOLIN  HFA) 108 (90 Base) MCG/ACT inhaler, INHALE 1-2 PUFFS BY MOUTH EVERY 4 HOURS AS NEEDED FOR WHEEZING, SHORTNESS OF BREATH, Disp: 8.5 g, Rfl: 3   budesonide-formoterol (SYMBICORT) 160-4.5 MCG/ACT inhaler, INHALE TWO PUFFS TWICE DAILY, Disp: 10.2 g, Rfl: 3   diazepam (VALIUM) 5 MG tablet, Take 5 mg by mouth 2 (two) times daily as needed., Disp: , Rfl:    fluticasone (FLONASE) 50 MCG/ACT nasal spray, INSTILL 2 SPRAYS IN EACH NOSTRIL ONCE DAILY (Patient taking differently: as needed.), Disp: 16 g, Rfl: 5   gabapentin (NEURONTIN) 600 MG tablet, Take 600 mg by mouth daily. , Disp: , Rfl:    ibuprofen (ADVIL) 800 MG tablet, Take 1 tablet (800 mg total) by mouth 3 (three) times daily., Disp: 21 tablet, Rfl: 0   levothyroxine (SYNTHROID, LEVOTHROID) 175 MCG tablet, TAKE 1 TABLET BY MOUTH DAILY BEFORE BREAKFAST. (Patient taking differently: 137 mcg. TAKE 1 TABLET BY MOUTH DAILY  BEFORE BREAKFAST.), Disp: 90 tablet, Rfl: 3   nitrofurantoin, macrocrystal-monohydrate, (MACROBID) 100 MG capsule, Take 100 mg by mouth at bedtime., Disp: , Rfl:    omeprazole (PRILOSEC) 40 MG capsule, Take 1 capsule (40 mg total) by mouth daily. 1 tablet daily before dinner, Disp: 30 capsule, Rfl: 3   solifenacin (VESICARE) 5 MG tablet, Take 5 mg by mouth daily., Disp: , Rfl:    tizanidine (ZANAFLEX) 2 MG capsule, Take 2 mg by mouth 3 times/day as needed-between meals & bedtime. Reported on 12/11/2015, Disp: , Rfl:    sertraline (ZOLOFT) 100 MG tablet, Take 100 mg by mouth daily., Disp: , Rfl:    Tour manager MISC, 1 Device as directed by Does not apply route., Disp: 1 each, Rfl: 0  Orders Placed This Encounter  Procedures   CT CARDIAC SCORING ([facility] LOCATIONS ONLY)   PCV CARDIAC STRESS TEST   EKG 12-Lead   PCV ECHOCARDIOGRAM COMPLETE    There are no Patient Instructions on file for this visit.   --Continue cardiac medications as reconciled in final medication list. --Return in about 6 weeks (around 09/28/2022) for  Reevaluation of, Chest pain, Review test results. or sooner if needed. --Continue follow-up with your primary care physician regarding the management of your other chronic comorbid conditions.  Patient's questions and concerns were addressed to her satisfaction. She voices understanding of the instructions provided during this encounter.   This note was created using a voice recognition software as a result there may be grammatical errors inadvertently enclosed that do not reflect the nature of this encounter. Every attempt is made to correct such errors.  Rex Kras, [place], [facility]  Pager: [phone] Office: [phone]

## 2022-08-23 ENCOUNTER — Encounter (HOSPITAL_COMMUNITY): Payer: Self-pay

## 2022-08-23 ENCOUNTER — Ambulatory Visit (HOSPITAL_COMMUNITY): Payer: PPO

## 2022-08-29 NOTE — Progress Notes (Signed)
External Labs: Collected:  08/10/2022  provided by PCP.  A1c 5.4   hemoglobin 14.5, hematocrit 44.1%   BUN 19, creatinine 0.63. Sodium 140, potassium 4, chloride 104, bicarb 25  AST 15, ALT 20, alkaline phosphatase 70 .  TSH 2.72  Total cholesterol 290, triglycerides 129 ,  HDL 51, LDL 215 , non-HDL 239.  Please have the patient check a fasting lipid profile and direct LDL prior to the next office visit.  Either with [place] or with PCP.  Please order and release labs.  [name] Simkin [place], DO, [facility]

## 2022-08-31 ENCOUNTER — Other Ambulatory Visit: Payer: Self-pay

## 2022-08-31 DIAGNOSIS — E7800 Pure hypercholesterolemia, unspecified: Secondary | ICD-10-CM

## 2022-08-31 NOTE — Progress Notes (Signed)
Spoke with patient. Added and released orders for labs.

## 2022-08-31 NOTE — Progress Notes (Signed)
Called patient no answer, left a vm

## 2022-09-10 ENCOUNTER — Ambulatory Visit (HOSPITAL_COMMUNITY)
Admission: RE | Admit: 2022-09-10 | Discharge: 2022-09-10 | Disposition: A | Discharge status: Home / Self Care | Payer: PPO | Source: Ambulatory Visit | Attending: Neurosurgery | Admitting: Neurosurgery

## 2022-09-10 DIAGNOSIS — M5440 Lumbago with sciatica, unspecified side: Secondary | ICD-10-CM | POA: Insufficient documentation

## 2022-09-10 DIAGNOSIS — M542 Cervicalgia: Secondary | ICD-10-CM | POA: Diagnosis not present

## 2022-09-10 DIAGNOSIS — R200 Anesthesia of skin: Secondary | ICD-10-CM | POA: Diagnosis not present

## 2022-09-10 DIAGNOSIS — M5412 Radiculopathy, cervical region: Secondary | ICD-10-CM | POA: Diagnosis not present

## 2022-09-10 DIAGNOSIS — M545 Low back pain, unspecified: Secondary | ICD-10-CM | POA: Diagnosis not present

## 2022-09-14 DIAGNOSIS — M542 Cervicalgia: Secondary | ICD-10-CM | POA: Diagnosis not present

## 2022-09-14 DIAGNOSIS — Z6835 Body mass index (BMI) 35.0-35.9, adult: Secondary | ICD-10-CM | POA: Diagnosis not present

## 2022-09-17 ENCOUNTER — Ambulatory Visit: Payer: PPO

## 2022-09-17 DIAGNOSIS — R072 Precordial pain: Secondary | ICD-10-CM

## 2022-09-20 DIAGNOSIS — N3946 Mixed incontinence: Secondary | ICD-10-CM | POA: Diagnosis not present

## 2022-09-20 DIAGNOSIS — N3020 Other chronic cystitis without hematuria: Secondary | ICD-10-CM | POA: Diagnosis not present

## 2022-09-24 ENCOUNTER — Other Ambulatory Visit: Payer: PPO

## 2022-09-24 NOTE — Progress Notes (Signed)
Left VM for patient to call back for results.

## 2022-09-28 ENCOUNTER — Ambulatory Visit: Payer: PPO | Admitting: Cardiology

## 2022-09-28 NOTE — Progress Notes (Signed)
Called patient to inform her about her echo results. Pt understood

## 2022-09-28 NOTE — Progress Notes (Signed)
Called patient to inform her about her stress test results. Pt understood

## 2022-10-06 ENCOUNTER — Other Ambulatory Visit: Payer: PPO

## 2022-10-17 ENCOUNTER — Emergency Department (HOSPITAL_BASED_OUTPATIENT_CLINIC_OR_DEPARTMENT_OTHER)
Admission: EM | Admit: 2022-10-17 | Discharge: 2022-10-17 | Disposition: A | Discharge status: Home / Self Care | Payer: PPO | Attending: Emergency Medicine | Admitting: Emergency Medicine

## 2022-10-17 ENCOUNTER — Other Ambulatory Visit: Payer: Self-pay

## 2022-10-17 DIAGNOSIS — Z79899 Other long term (current) drug therapy: Secondary | ICD-10-CM | POA: Insufficient documentation

## 2022-10-17 DIAGNOSIS — Z7951 Long term (current) use of inhaled steroids: Secondary | ICD-10-CM | POA: Insufficient documentation

## 2022-10-17 DIAGNOSIS — E039 Hypothyroidism, unspecified: Secondary | ICD-10-CM | POA: Insufficient documentation

## 2022-10-17 DIAGNOSIS — T7840XA Allergy, unspecified, initial encounter: Secondary | ICD-10-CM | POA: Diagnosis not present

## 2022-10-17 DIAGNOSIS — J45909 Unspecified asthma, uncomplicated: Secondary | ICD-10-CM | POA: Diagnosis not present

## 2022-10-17 DIAGNOSIS — R21 Rash and other nonspecific skin eruption: Secondary | ICD-10-CM | POA: Diagnosis present

## 2022-10-17 MED ORDER — EPINEPHRINE 0.3 MG/0.3ML IJ SOAJ
0.3000 mg | Freq: Once | INTRAMUSCULAR | Status: AC
  Administered 2022-10-17: 0.3 mg via INTRAMUSCULAR
  Filled 2022-10-17: qty 0.3

## 2022-10-17 MED ORDER — DEXAMETHASONE 1 MG/ML PO CONC: 10.0000 mg | Freq: Once | ORAL | Status: DC

## 2022-10-17 MED ORDER — DEXAMETHASONE 10 MG/ML FOR PEDIATRIC ORAL USE
10.0000 mg | Freq: Once | INTRAMUSCULAR | Status: AC
  Administered 2022-10-17: 10 mg via ORAL
  Filled 2022-10-17: qty 1

## 2022-10-17 MED ORDER — FAMOTIDINE 20 MG PO TABS
20.0000 mg | ORAL_TABLET | Freq: Once | ORAL | Status: AC
  Administered 2022-10-17: 20 mg via ORAL
  Filled 2022-10-17: qty 1

## 2022-10-17 NOTE — ED Triage Notes (Addendum)
BIB partner from home for allergic reaction. Endorses c/o allergic reaction, "feels throat swelling, as well as itching, rash/hives, onset 1 hr PTA, took benadryl and allegra PTA. LS CTA, throat redness noted, some rash noted to wrist. Mentions BP cuff causes pressure urticaria.

## 2022-10-17 NOTE — Discharge Instructions (Addendum)
Note the workup today was overall consistent with allergic reaction.  As discussed, you may take Benadryl as needed for rash/itching sensation.  Use EpiPen as needed for anaphylactic type reaction as we discussed.  See discharge papers for more full details regarding anaphylaxis.  Recommend reevaluation by primary care within 1 week.  Please do not hesitate to return to emergency department for worrisome signs and symptoms we discussed become apparent.

## 2022-10-17 NOTE — ED Provider Notes (Cosign Needed Addendum)
[facility] Provider Note   CSN: [id number] Arrival date & time: 10/17/22  4332     History  Chief Complaint  Patient presents with   Allergic Reaction    [name] [name] is a 62 y.o. female.   Allergic Reaction   62 year old female presents emergency department with concerns of allergic reaction.  Patient states that symptoms began approximate 1 hour prior to arrival.  She was reported taking a nap next to her friend when her friends interference here subsequently on her face.  The friend stated that she just started to use a new hair product today of which is most likely source of allergic type reaction.  Patient reports feelings of lip swelling, throat swelling rash on right upper extremity that is itchy and red in nature.  Patient states that prior to arrival, took Allegra 45 minutes and Benadryl subsequently 20 minutes prior to arrival.  She states that symptoms of lip swelling/tingling have completely resolved, feeling of throat closing on her has significantly improved but rash on right upper extremity remains itchy and erythematous.  Reports history of similar symptoms in the past to pet dander and has had to take Benadryl.  Does not have EpiPen's never had to use 1.  Past medical history significant for hyperlipidemia, fibromyalgia, asthma, allergies, hypothyroidism, depression, chronic pain syndrome, substance abuse  Home Medications Prior to Admission medications   Medication Sig Start Date End Date Taking? Authorizing Provider  albuterol (PROVENTIL) (2.5 MG/3ML) 0.083% nebulizer solution INHALE THE CONTENTS OF ONE VIAL EVERY 4 HOURS AS NEEDED 03/18/21   Mannam, Praveen, MD  albuterol (VENTOLIN HFA) 108 (90 Base) MCG/ACT inhaler INHALE 1-2 PUFFS BY MOUTH EVERY 4 HOURS AS NEEDED FOR WHEEZING, SHORTNESS OF BREATH 05/26/21   Mannam, Praveen, MD  budesonide-formoterol (SYMBICORT) 160-4.5 MCG/ACT inhaler INHALE TWO PUFFS TWICE DAILY 04/17/20    Mannam, Praveen, MD  diazepam (VALIUM) 5 MG tablet Take 5 mg by mouth 2 (two) times daily as needed. 07/29/22   [provider]  fluticasone (FLONASE) 50 MCG/ACT nasal spray INSTILL 2 SPRAYS IN EACH NOSTRIL ONCE DAILY Patient taking differently: as needed. 09/15/20   Mannam, Hart Robinsons, MD  gabapentin (NEURONTIN) 600 MG tablet Take 600 mg by mouth daily.     [provider]  ibuprofen (ADVIL) 800 MG tablet Take 1 tablet (800 mg total) by mouth 3 (three) times daily. 07/16/21   Jaynee Eagles, PA-C  levothyroxine (SYNTHROID, LEVOTHROID) 175 MCG tablet TAKE 1 TABLET BY MOUTH DAILY BEFORE BREAKFAST. Patient taking differently: 137 mcg. TAKE 1 TABLET BY MOUTH DAILY BEFORE BREAKFAST. 07/12/17   Raylene Everts, MD  nitrofurantoin, macrocrystal-monohydrate, (MACROBID) 100 MG capsule Take 100 mg by mouth at bedtime. 07/28/22   [provider]  omeprazole (PRILOSEC) 40 MG capsule Take 1 capsule (40 mg total) by mouth daily. 1 tablet daily before dinner 05/20/21   Rozetta Nunnery, MD  sertraline (ZOLOFT) 100 MG tablet Take 100 mg by mouth daily. 03/14/14   [provider]  solifenacin (VESICARE) 5 MG tablet Take 5 mg by mouth daily. 06/11/22   [provider]  Spacer/Aero Chamber Mouthpiece MISC 1 Device as directed by Does not apply route. 07/26/17   Javier Glazier, MD  tizanidine (ZANAFLEX) 2 MG capsule Take 2 mg by mouth 3 times/day as needed-between meals & bedtime. Reported on 12/11/2015    [provider]      Allergies    Celecoxib, Codeine, Nsaids, and Other  Review of Systems   Review of Systems  All other systems reviewed and are negative.   Physical Exam Updated Vital Signs BP 121/70   Pulse (!) 59   Temp 97.9 F (36.6 C) (Oral)   Resp 16   Ht '5\' 6"'$  (1.676 m)   Wt 96.6 kg   LMP 10/24/2012   SpO2 96%   BMI 34.38 kg/m  Physical Exam Vitals and nursing note reviewed.  Constitutional:      General: She is not in acute  distress.    Appearance: She is well-developed.  HENT:     Head: Normocephalic and atraumatic.     Mouth/Throat:     Comments: Uvula midline rise symmetrical with phonation.  Tonsils 0-1+ bilaterally with no obvious exudate.  No sublingual or submandibular swelling appreciated. Eyes:     Conjunctiva/sclera: Conjunctivae normal.  Cardiovascular:     Rate and Rhythm: Normal rate and regular rhythm.     Heart sounds: No murmur heard. Pulmonary:     Effort: Pulmonary effort is normal. No respiratory distress.     Breath sounds: Normal breath sounds. No rhonchi.     Comments: Nausea auscultatory stridor. Abdominal:     Palpations: Abdomen is soft.     Tenderness: There is no abdominal tenderness.  Musculoskeletal:        General: No swelling.     Cervical back: Neck supple.  Skin:    General: Skin is warm and dry.     Capillary Refill: Capillary refill takes less than 2 seconds.  Neurological:     Mental Status: She is alert.  Psychiatric:        Mood and Affect: Mood normal.     ED Results / Procedures / Treatments   Labs (all labs ordered are listed, but only abnormal results are displayed) Labs Reviewed - No data to display  EKG None  Radiology No results found.  Procedures Procedures    Medications Ordered in ED Medications  famotidine (PEPCID) tablet 20 mg (20 mg Oral Given 10/17/22 1716)  EPINEPHrine (EPI-PEN) injection 0.3 mg (0.3 mg Intramuscular Provided for home use 10/17/22 1716)  dexamethasone (DECADRON) 10 MG/ML injection for Pediatric ORAL use 10 mg (10 mg Oral Given 10/17/22 1716)    ED Course/ Medical Decision Making/ A&P Clinical Course as of 10/17/22 Mcarthur Rossetti Oct 17, 2022  1725 Patient just given Pepcid and steroid.  Patient noted significant improvement of symptoms but still slight sensation in throat.  To observe for 30 to 45 minutes with expectant discharge.  Patient given EpiPen in hand. [CR]    Clinical Course User Index [CR] Wilnette Kales,  PA                             Medical Decision Making Risk Prescription drug management.   This patient presents to the ED for concern of allergic type reaction, this involves an extensive number of treatment options, and is a complaint that carries with it a high risk of complications and morbidity.  The differential diagnosis includes anaphylaxis, urticaria, angioedema   Co morbidities that complicate the patient evaluation  See HPI   Additional history obtained:  Additional history obtained from EMR External records from outside source obtained and reviewed including hospital records   Lab Tests:  N/a   Imaging Studies ordered:  N/a   Cardiac Monitoring: / EKG:  The patient was maintained on a cardiac monitor.  I  personally viewed and interpreted the cardiac monitored which showed an underlying rhythm of: Sinus rhythm   Consultations Obtained:  N/a   Problem List / ED Course / Critical interventions / Medication management  Allergic reaction I ordered medication including famotidine and Decadron   Reevaluation of the patient after these medicines showed that the patient improved I have reviewed the patients home medicines and have made adjustments as needed   Social Determinants of Health:  Former cigarette use.  Denies illicit drug use.   Test / Admission - Considered:  Allergic reaction Vitals signs within normal range and stable throughout visit. Patient with evidence of allergic reaction without signs of anaphylaxis.  Patient responding well to Benadryl taking outpatient in p.o. form.  Prior to receiving medications of famotidine and Decadron, patient states that her symptoms have improved significantly and is requesting discharge.  No clinical indication for epinephrine.  Patient observed for 2 hours in the emergency department.  Given patient's near resolution of symptoms, will discharge home with epinephrine and hand to use if anaphylactic type  reaction occurs.  Recommend reevaluation by primary care for reassessment of symptoms.  Treatment plan discussed at length with patient and she acknowledged understanding was agreeable to said plan.  Worrisome signs and symptoms were discussed with the patient, and the patient acknowledged understanding to return to the ED if noticed. Patient was stable upon discharge.   Final Clinical Impression(s) / ED Diagnoses Final diagnoses:  Allergic reaction, initial encounter    Rx / DC Orders ED Discharge Orders     None           Wilnette Kales, [place] 10/17/22 1759    Fredia Sorrow, MD 10/18/22 [phone]

## 2022-10-17 NOTE — ED Notes (Signed)
Patient reporting improvement after medication administration. Provider notified.

## 2022-10-25 ENCOUNTER — Other Ambulatory Visit: Payer: PPO

## 2022-11-02 ENCOUNTER — Encounter: Payer: Self-pay | Admitting: Gastroenterology

## 2022-11-09 ENCOUNTER — Ambulatory Visit: Payer: PPO | Admitting: Cardiology

## 2022-11-24 ENCOUNTER — Other Ambulatory Visit: Payer: Self-pay | Admitting: Pulmonary Disease

## 2022-11-24 DIAGNOSIS — M9903 Segmental and somatic dysfunction of lumbar region: Secondary | ICD-10-CM | POA: Diagnosis not present

## 2022-11-24 DIAGNOSIS — M9901 Segmental and somatic dysfunction of cervical region: Secondary | ICD-10-CM | POA: Diagnosis not present

## 2022-11-24 DIAGNOSIS — M9902 Segmental and somatic dysfunction of thoracic region: Secondary | ICD-10-CM | POA: Diagnosis not present

## 2022-11-24 DIAGNOSIS — M542 Cervicalgia: Secondary | ICD-10-CM | POA: Diagnosis not present

## 2022-11-24 DIAGNOSIS — M5441 Lumbago with sciatica, right side: Secondary | ICD-10-CM | POA: Diagnosis not present

## 2022-11-24 DIAGNOSIS — M546 Pain in thoracic spine: Secondary | ICD-10-CM | POA: Diagnosis not present

## 2022-11-25 DIAGNOSIS — R350 Frequency of micturition: Secondary | ICD-10-CM | POA: Diagnosis not present

## 2022-11-25 DIAGNOSIS — N3946 Mixed incontinence: Secondary | ICD-10-CM | POA: Diagnosis not present

## 2022-11-25 DIAGNOSIS — N3020 Other chronic cystitis without hematuria: Secondary | ICD-10-CM | POA: Diagnosis not present

## 2022-11-26 DIAGNOSIS — M9903 Segmental and somatic dysfunction of lumbar region: Secondary | ICD-10-CM | POA: Diagnosis not present

## 2022-11-26 DIAGNOSIS — M9902 Segmental and somatic dysfunction of thoracic region: Secondary | ICD-10-CM | POA: Diagnosis not present

## 2022-11-26 DIAGNOSIS — M5441 Lumbago with sciatica, right side: Secondary | ICD-10-CM | POA: Diagnosis not present

## 2022-11-26 DIAGNOSIS — M9901 Segmental and somatic dysfunction of cervical region: Secondary | ICD-10-CM | POA: Diagnosis not present

## 2022-11-26 DIAGNOSIS — M546 Pain in thoracic spine: Secondary | ICD-10-CM | POA: Diagnosis not present

## 2022-11-26 DIAGNOSIS — M542 Cervicalgia: Secondary | ICD-10-CM | POA: Diagnosis not present

## 2022-11-29 ENCOUNTER — Ambulatory Visit
Admission: EM | Admit: 2022-11-29 | Discharge: 2022-11-29 | Disposition: A | Discharge status: Home / Self Care | Payer: PPO | Attending: Family Medicine | Admitting: Family Medicine

## 2022-11-29 DIAGNOSIS — M542 Cervicalgia: Secondary | ICD-10-CM | POA: Diagnosis not present

## 2022-11-29 DIAGNOSIS — M9903 Segmental and somatic dysfunction of lumbar region: Secondary | ICD-10-CM | POA: Diagnosis not present

## 2022-11-29 DIAGNOSIS — N760 Acute vaginitis: Secondary | ICD-10-CM | POA: Diagnosis not present

## 2022-11-29 DIAGNOSIS — M9901 Segmental and somatic dysfunction of cervical region: Secondary | ICD-10-CM | POA: Diagnosis not present

## 2022-11-29 DIAGNOSIS — M5441 Lumbago with sciatica, right side: Secondary | ICD-10-CM | POA: Diagnosis not present

## 2022-11-29 DIAGNOSIS — M546 Pain in thoracic spine: Secondary | ICD-10-CM | POA: Diagnosis not present

## 2022-11-29 DIAGNOSIS — M9902 Segmental and somatic dysfunction of thoracic region: Secondary | ICD-10-CM | POA: Diagnosis not present

## 2022-11-29 NOTE — ED Triage Notes (Signed)
Pt reports that her vagina is itchy and burning x 1 day.  When she washes or wipes it makes her vagina even more itchy.   Pt states she has diflucan, but did not want to take it unless she new this was what it was.

## 2022-11-29 NOTE — ED Provider Notes (Signed)
[facility]    CSN: [id number] Arrival date & time: 11/29/22  1320      History   Chief Complaint No chief complaint on file.   HPI [name] [name] is a 62 y.o. female.   Patient presenting today with 1 day history of significant vaginal itching, no noted abnormal discharge.  Denies dysuria, hematuria, pelvic or abdominal pain, new soaps or products to the area, concern for sexually transmitted infections.  So far not tried anything over-the-counter for symptoms.  States that she has some Diflucan at home but wanted to make sure that was what was going on before she took it.    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   Thyroid disease    hypo thyroid    Patient Active Problem List   Diagnosis Date Noted   Acute rhinitis 08/28/2018   Trochanteric bursitis, right hip 06/15/2017   Restrictive lung disease 03/31/2017   BMI 40.0-44.9, adult ([place]) 11/30/2016   Depression with anxiety 09/20/2015   Chronic pain syndrome 09/19/2015   MALAISE AND FATIGUE 10/25/2008   Asthma 08/24/2007   Hypothyroidism 07/20/2007   CONSTIPATION NOS 10/17/2006   LOW BACK PAIN 10/17/2006    Past Surgical History:  Procedure Laterality Date   ANTERIOR CERVICAL DECOMP/DISCECTOMY FUSION     L4, L5    SHOULDER ARTHROSCOPY WITH ROTATOR CUFF REPAIR AND SUBACROMIAL DECOMPRESSION     TONSILLECTOMY     VIDEO ASSISTED THORACOSCOPY (VATS)/DECORTICATION Right 09/24/2015   Procedure: VIDEO ASSISTED THORACOSCOPY (VATS)/DECORTICATION;  Surgeon: Melrose Nakayama, MD;   Location: [facility];  Service: Thoracic;  Laterality: Right;   VIDEO BRONCHOSCOPY N/A 09/24/2015   Procedure: VIDEO BRONCHOSCOPY;  Surgeon: Melrose Nakayama, MD;  Location: [facility];  Service: Thoracic;  Laterality: N/A;    OB History   No obstetric history on file.      Home Medications    Prior to Admission medications   Medication Sig Start Date End Date Taking? Authorizing Provider  albuterol (PROVENTIL) (2.5 MG/3ML) 0.083% nebulizer solution INHALE THE CONTENTS OF ONE VIAL EVERY 4 HOURS AS NEEDED 03/18/21   Mannam, Praveen, MD  albuterol (VENTOLIN HFA) 108 (90 Base) MCG/ACT inhaler INHALE 1-2 PUFFS BY MOUTH EVERY 4 HOURS AS NEEDED FOR WHEEZING, SHORTNESS OF BREATH 05/26/21   Mannam, Praveen, MD  budesonide-formoterol (SYMBICORT) 160-4.5 MCG/ACT inhaler INHALE TWO PUFFS TWICE DAILY 04/17/20   Mannam, Praveen, MD  diazepam (VALIUM) 5 MG tablet Take 5 mg by mouth 2 (two) times daily as needed. 07/29/22   [provider]  fluticasone (FLONASE) 50 MCG/ACT nasal spray INSTILL 2 SPRAYS IN EACH NOSTRIL ONCE DAILY Patient taking differently: as needed. 09/15/20   Mannam, Hart Robinsons, MD  gabapentin (NEURONTIN) 600 MG tablet Take 600 mg by mouth daily.     [provider]  ibuprofen (ADVIL) 800 MG tablet Take 1 tablet (800 mg total) by mouth 3 (three) times daily. 07/16/21   Bess Harvest,  Freida Busman, PA-C  levothyroxine (SYNTHROID, LEVOTHROID) 175 MCG tablet TAKE 1 TABLET BY MOUTH DAILY BEFORE BREAKFAST. Patient taking differently: 137 mcg. TAKE 1 TABLET BY MOUTH DAILY BEFORE BREAKFAST. 07/12/17   Raylene Everts, MD  nitrofurantoin, macrocrystal-monohydrate, (MACROBID) 100 MG capsule Take 100 mg by mouth at bedtime. 07/28/22   [provider]  omeprazole (PRILOSEC) 40 MG capsule Take 1 capsule (40 mg total) by mouth daily. 1 tablet daily before dinner 05/20/21   Rozetta Nunnery, MD  sertraline (ZOLOFT) 100 MG tablet Take 100 mg by mouth daily. 03/14/14   [provider]   solifenacin (VESICARE) 5 MG tablet Take 5 mg by mouth daily. 06/11/22   [provider]  Spacer/Aero Chamber Mouthpiece MISC 1 Device as directed by Does not apply route. 07/26/17   Javier Glazier, MD  tizanidine (ZANAFLEX) 2 MG capsule Take 2 mg by mouth 3 times/day as needed-between meals & bedtime. Reported on 12/11/2015    [provider]    Family History Family History  Problem Relation Age of Onset   Bone cancer Father    Asthma Father    Diabetes Father    Alcohol abuse Father    Cancer Father    COPD Father    Heart disease Father    Hypertension Father    Hyperlipidemia Father    Pneumonia Mother        biological mother deceased at age 36   Depression Mother    Alcohol abuse Sister    Alcohol abuse Brother    Alcohol abuse Sister    Alcohol abuse Brother    Colon cancer Neg Hx    Esophageal cancer Neg Hx    Stomach cancer Neg Hx    Rectal cancer Neg Hx     Social History Social History   Tobacco Use   Smoking status: Former    Packs/day: 1    Types: E-cigarettes, Cigarettes    Start date: 04/16/1971    Quit date: 09/14/2007    Years since quitting: 15.2   Smokeless tobacco: Former    Types: Snuff, Chew    Quit date: 09/14/2007  Vaping Use   Vaping Use: Former   Quit date: 07/26/2017  Substance Use Topics   Alcohol use: No    Alcohol/week: 0.0 standard drinks of alcohol    Comment: Quit 11/11/1989   Drug use: No    Types: "Crack" cocaine, Marijuana    Comment: Quit in 1991     Allergies   Celecoxib, Codeine, Nsaids, and Other   Review of Systems Review of Systems Per HPI  Physical Exam Triage Vital Signs ED Triage Vitals  Enc Vitals Group     BP 11/29/22 1433 109/76     Pulse Rate 11/29/22 1433 68     Resp 11/29/22 1433 18     Temp 11/29/22 1433 98 F (36.7 C)     Temp Source 11/29/22 1433 Oral     SpO2 11/29/22 1433 97 %     Weight --      Height --      Head Circumference --      Peak Flow --      Pain Score  11/29/22 1438 3     Pain Loc --      Pain Edu? --      Excl. in [place]? --    No data found.  Updated Vital Signs BP 109/76 (BP Location: Right Arm)   Pulse 68   Temp 98  F (36.7 C) (Oral)   Resp 18   LMP 10/24/2012   SpO2 97%   Visual Acuity Right Eye Distance:   Left Eye Distance:   Bilateral Distance:    Right Eye Near:   Left Eye Near:    Bilateral Near:     Physical Exam Vitals and nursing note reviewed.  Constitutional:      Appearance: Normal appearance. She is not ill-appearing.  HENT:     Head: Atraumatic.  Eyes:     Extraocular Movements: Extraocular movements intact.     Conjunctiva/sclera: Conjunctivae normal.  Cardiovascular:     Rate and Rhythm: Normal rate and regular rhythm.     Heart sounds: Normal heart sounds.  Pulmonary:     Effort: Pulmonary effort is normal.     Breath sounds: Normal breath sounds.  Abdominal:     General: Bowel sounds are normal. There is no distension.     Palpations: Abdomen is soft.     Tenderness: There is no abdominal tenderness. There is no right CVA tenderness, left CVA tenderness or guarding.  Genitourinary:    Comments: GU exam deferred, self swab performed Musculoskeletal:        General: Normal range of motion.     Cervical back: Normal range of motion and neck supple.  Skin:    General: Skin is warm and dry.  Neurological:     Mental Status: She is alert and oriented to person, place, and time.  Psychiatric:        Mood and Affect: Mood normal.        Thought Content: Thought content normal.        Judgment: Judgment normal.      UC Treatments / Results  Labs (all labs ordered are listed, but only abnormal results are displayed) Labs Reviewed  CERVICOVAGINAL ANCILLARY ONLY    EKG   Radiology No results found.  Procedures Procedures (including critical care time)  Medications Ordered in UC Medications - No data to display  Initial Impression / Assessment and Plan / UC Course  I have  reviewed the triage vital signs and the nursing notes.  Pertinent labs & imaging results that were available during my care of the patient were reviewed by me and considered in my medical decision making (see chart for details).     Vaginal swab pending, treat based on results.  Discussed supportive over-the-counter medications and home care additionally.  Final Clinical Impressions(s) / UC Diagnoses   Final diagnoses:  Acute vaginitis   Discharge Instructions   None    ED Prescriptions   None    PDMP not reviewed this encounter.   Volney American, [place] 11/29/22 1717

## 2022-11-30 ENCOUNTER — Telehealth (HOSPITAL_COMMUNITY): Payer: Self-pay | Admitting: Emergency Medicine

## 2022-11-30 LAB — CERVICOVAGINAL ANCILLARY ONLY
Bacterial Vaginitis (gardnerella): POSITIVE — AB
Candida Glabrata: NEGATIVE
Candida Vaginitis: POSITIVE — AB
Chlamydia: NEGATIVE
Comment: NEGATIVE
Comment: NEGATIVE
Comment: NEGATIVE
Comment: NEGATIVE
Comment: NEGATIVE
Comment: NORMAL
Neisseria Gonorrhea: NEGATIVE
Trichomonas: NEGATIVE

## 2022-11-30 MED ORDER — METRONIDAZOLE 500 MG PO TABS: 500.0000 mg | ORAL_TABLET | Freq: Two times a day (BID) | ORAL | 0 refills | Status: AC

## 2022-11-30 MED ORDER — FLUCONAZOLE 150 MG PO TABS: 150.0000 mg | ORAL_TABLET | Freq: Once | ORAL | 0 refills | Status: AC

## 2022-12-03 DIAGNOSIS — M9901 Segmental and somatic dysfunction of cervical region: Secondary | ICD-10-CM | POA: Diagnosis not present

## 2022-12-03 DIAGNOSIS — M542 Cervicalgia: Secondary | ICD-10-CM | POA: Diagnosis not present

## 2022-12-03 DIAGNOSIS — M546 Pain in thoracic spine: Secondary | ICD-10-CM | POA: Diagnosis not present

## 2022-12-03 DIAGNOSIS — M9902 Segmental and somatic dysfunction of thoracic region: Secondary | ICD-10-CM | POA: Diagnosis not present

## 2022-12-03 DIAGNOSIS — M9903 Segmental and somatic dysfunction of lumbar region: Secondary | ICD-10-CM | POA: Diagnosis not present

## 2022-12-03 DIAGNOSIS — M5441 Lumbago with sciatica, right side: Secondary | ICD-10-CM | POA: Diagnosis not present

## 2022-12-08 DIAGNOSIS — M546 Pain in thoracic spine: Secondary | ICD-10-CM | POA: Diagnosis not present

## 2022-12-08 DIAGNOSIS — M542 Cervicalgia: Secondary | ICD-10-CM | POA: Diagnosis not present

## 2022-12-08 DIAGNOSIS — M9901 Segmental and somatic dysfunction of cervical region: Secondary | ICD-10-CM | POA: Diagnosis not present

## 2022-12-08 DIAGNOSIS — M9903 Segmental and somatic dysfunction of lumbar region: Secondary | ICD-10-CM | POA: Diagnosis not present

## 2022-12-08 DIAGNOSIS — M5441 Lumbago with sciatica, right side: Secondary | ICD-10-CM | POA: Diagnosis not present

## 2022-12-08 DIAGNOSIS — M9902 Segmental and somatic dysfunction of thoracic region: Secondary | ICD-10-CM | POA: Diagnosis not present

## 2022-12-09 DIAGNOSIS — M5416 Radiculopathy, lumbar region: Secondary | ICD-10-CM | POA: Diagnosis not present

## 2022-12-09 DIAGNOSIS — M2569 Stiffness of other specified joint, not elsewhere classified: Secondary | ICD-10-CM | POA: Diagnosis not present

## 2022-12-09 DIAGNOSIS — M542 Cervicalgia: Secondary | ICD-10-CM | POA: Diagnosis not present

## 2022-12-09 DIAGNOSIS — M62552 Muscle wasting and atrophy, not elsewhere classified, left thigh: Secondary | ICD-10-CM | POA: Diagnosis not present

## 2022-12-14 DIAGNOSIS — M542 Cervicalgia: Secondary | ICD-10-CM | POA: Diagnosis not present

## 2022-12-14 DIAGNOSIS — M2569 Stiffness of other specified joint, not elsewhere classified: Secondary | ICD-10-CM | POA: Diagnosis not present

## 2022-12-14 DIAGNOSIS — M62552 Muscle wasting and atrophy, not elsewhere classified, left thigh: Secondary | ICD-10-CM | POA: Diagnosis not present

## 2022-12-14 DIAGNOSIS — M5416 Radiculopathy, lumbar region: Secondary | ICD-10-CM | POA: Diagnosis not present

## 2022-12-20 DIAGNOSIS — M2569 Stiffness of other specified joint, not elsewhere classified: Secondary | ICD-10-CM | POA: Diagnosis not present

## 2022-12-20 DIAGNOSIS — M542 Cervicalgia: Secondary | ICD-10-CM | POA: Diagnosis not present

## 2022-12-20 DIAGNOSIS — M5416 Radiculopathy, lumbar region: Secondary | ICD-10-CM | POA: Diagnosis not present

## 2022-12-20 DIAGNOSIS — M62552 Muscle wasting and atrophy, not elsewhere classified, left thigh: Secondary | ICD-10-CM | POA: Diagnosis not present

## 2022-12-29 DIAGNOSIS — J209 Acute bronchitis, unspecified: Secondary | ICD-10-CM | POA: Diagnosis not present

## 2022-12-30 DIAGNOSIS — N3020 Other chronic cystitis without hematuria: Secondary | ICD-10-CM | POA: Diagnosis not present

## 2023-01-02 DIAGNOSIS — R051 Acute cough: Secondary | ICD-10-CM | POA: Diagnosis not present

## 2023-01-02 DIAGNOSIS — J441 Chronic obstructive pulmonary disease with (acute) exacerbation: Secondary | ICD-10-CM | POA: Diagnosis not present

## 2023-01-12 DIAGNOSIS — N3642 Intrinsic sphincter deficiency (ISD): Secondary | ICD-10-CM | POA: Diagnosis not present

## 2023-01-26 DIAGNOSIS — R350 Frequency of micturition: Secondary | ICD-10-CM | POA: Diagnosis not present

## 2023-01-26 DIAGNOSIS — N3946 Mixed incontinence: Secondary | ICD-10-CM | POA: Diagnosis not present

## 2023-03-28 DIAGNOSIS — M5136 Other intervertebral disc degeneration, lumbar region: Secondary | ICD-10-CM | POA: Diagnosis not present

## 2023-03-28 DIAGNOSIS — G894 Chronic pain syndrome: Secondary | ICD-10-CM | POA: Diagnosis not present

## 2023-03-28 DIAGNOSIS — E039 Hypothyroidism, unspecified: Secondary | ICD-10-CM | POA: Diagnosis not present

## 2023-03-28 DIAGNOSIS — J4540 Moderate persistent asthma, uncomplicated: Secondary | ICD-10-CM | POA: Diagnosis not present

## 2023-03-28 DIAGNOSIS — F39 Unspecified mood [affective] disorder: Secondary | ICD-10-CM | POA: Diagnosis not present

## 2023-03-28 DIAGNOSIS — E7800 Pure hypercholesterolemia, unspecified: Secondary | ICD-10-CM | POA: Diagnosis not present

## 2023-04-01 DIAGNOSIS — R350 Frequency of micturition: Secondary | ICD-10-CM | POA: Diagnosis not present

## 2023-04-01 DIAGNOSIS — N3946 Mixed incontinence: Secondary | ICD-10-CM | POA: Diagnosis not present

## 2023-08-18 DIAGNOSIS — F1911 Other psychoactive substance abuse, in remission: Secondary | ICD-10-CM | POA: Diagnosis not present

## 2023-08-18 DIAGNOSIS — E7800 Pure hypercholesterolemia, unspecified: Secondary | ICD-10-CM | POA: Diagnosis not present

## 2023-08-18 DIAGNOSIS — G894 Chronic pain syndrome: Secondary | ICD-10-CM | POA: Diagnosis not present

## 2023-08-18 DIAGNOSIS — M19011 Primary osteoarthritis, right shoulder: Secondary | ICD-10-CM | POA: Diagnosis not present

## 2023-08-18 DIAGNOSIS — Z79899 Other long term (current) drug therapy: Secondary | ICD-10-CM | POA: Diagnosis not present

## 2023-08-18 DIAGNOSIS — Z7185 Encounter for immunization safety counseling: Secondary | ICD-10-CM | POA: Diagnosis not present

## 2023-08-18 DIAGNOSIS — Z1231 Encounter for screening mammogram for malignant neoplasm of breast: Secondary | ICD-10-CM | POA: Diagnosis not present

## 2023-08-18 DIAGNOSIS — Z0000 Encounter for general adult medical examination without abnormal findings: Secondary | ICD-10-CM | POA: Diagnosis not present

## 2023-08-18 DIAGNOSIS — Z23 Encounter for immunization: Secondary | ICD-10-CM | POA: Diagnosis not present

## 2023-08-18 DIAGNOSIS — E039 Hypothyroidism, unspecified: Secondary | ICD-10-CM | POA: Diagnosis not present

## 2023-08-19 ENCOUNTER — Other Ambulatory Visit (HOSPITAL_COMMUNITY): Payer: Self-pay | Admitting: Family Medicine

## 2023-08-19 DIAGNOSIS — Z1231 Encounter for screening mammogram for malignant neoplasm of breast: Secondary | ICD-10-CM

## 2023-08-24 ENCOUNTER — Ambulatory Visit (HOSPITAL_COMMUNITY): Payer: PPO

## 2023-08-25 ENCOUNTER — Encounter (HOSPITAL_COMMUNITY): Payer: Self-pay

## 2023-08-25 ENCOUNTER — Ambulatory Visit (HOSPITAL_COMMUNITY)
Admission: RE | Admit: 2023-08-25 | Discharge: 2023-08-25 | Disposition: A | Discharge status: Home / Self Care | Payer: PPO | Source: Ambulatory Visit | Attending: Family Medicine | Admitting: Family Medicine

## 2023-08-25 DIAGNOSIS — Z1231 Encounter for screening mammogram for malignant neoplasm of breast: Secondary | ICD-10-CM | POA: Diagnosis not present

## 2023-11-23 DIAGNOSIS — M25512 Pain in left shoulder: Secondary | ICD-10-CM | POA: Diagnosis not present

## 2023-11-23 DIAGNOSIS — M25511 Pain in right shoulder: Secondary | ICD-10-CM | POA: Diagnosis not present

## 2023-12-28 NOTE — Progress Notes (Signed)
 "  Chief Complaint: Change in bowels Primary GI MD: Dr. Aneita  HPI: Discussed the use of AI scribe software for clinical note transcription with the patient, who gave verbal consent to proceed.  History of Present Illness [name] [name] is a 63 year old female who presents with gastrointestinal issues, including diarrhea and swallowing difficulties.  For the past three months, she has experienced gastrointestinal symptoms resembling irritable bowel syndrome (IBS), characterized by loose stools and a sense of urgency leading to explosive diarrhea, particularly after breakfast. The initial bowel movement of the day may be more solid, but subsequent ones are typically diarrhea. These symptoms occur almost daily and are accompanied by lower abdominal pain which is relieved with defecation..  She has a history of reflux issues and previously underwent an endoscopy (possibly 2008?), after which she was advised to take omeprazole . While this initially improved her symptoms, she has not been taking it regularly and is now experiencing increased reflux and difficulty swallowing, particularly with solids. She attributes some of the swallowing difficulties to dental issues, as she has lost teeth, making chewing harder.  She underwent a colonoscopy in March 2014 and is due for another. There is no family history of colon cancer. She has not been using fiber supplements like Benefiber or Citrucel to manage her bowel habits.  Her friend, Gwenn, had similar symptoms and underwent a breath test for SIBO which was negative and patient would like to have the same breath test.   PREVIOUS GI WORKUP   Colonoscopy 11/2012 Normal colonoscopy Repeat 10 years  Past Medical History:  Diagnosis Date   Allergy    Anxiety    Arthritis    low back   Asthma    Carpal tunnel syndrome 01/13/2015   Bilateral   Depression    FIBROIDS, UTERUS 10/17/2006   Qualifier: Diagnosis of  By: Karren MD, Cornelius      Fibromyalgia    HLD (hyperlipidemia)    Hyperlipidemia    Lumbar degenerative disc disease    Nerve damage    right leg   Neuromuscular disorder ([facility])    fibromyalgia   Substance abuse ([facility])    recovered alcoholic   Thyroid  disease    hypo thyroid     Past Surgical History:  Procedure Laterality Date   ANTERIOR CERVICAL DECOMP/DISCECTOMY FUSION     L4, L5    SHOULDER ARTHROSCOPY WITH ROTATOR CUFF REPAIR AND SUBACROMIAL DECOMPRESSION     TONSILLECTOMY     VIDEO ASSISTED THORACOSCOPY (VATS)/DECORTICATION Right 09/24/2015   Procedure: VIDEO ASSISTED THORACOSCOPY (VATS)/DECORTICATION;  Surgeon: Elspeth JAYSON Millers, MD;  [facility];  Service: Thoracic;  Laterality: Right;   VIDEO BRONCHOSCOPY N/A 09/24/2015   Procedure: VIDEO BRONCHOSCOPY;  Surgeon: Elspeth JAYSON Millers, MD;  Location[facility];  Service: Thoracic;  Laterality: N/A;    Current Outpatient Medications  Medication Sig Dispense Refill   albuterol  (PROVENTIL ) (2.5 MG/3ML) 0.083% nebulizer solution INHALE THE CONTENTS OF ONE VIAL EVERY 4 HOURS AS NEEDED 90 mL 0   albuterol  (VENTOLIN  HFA) 108 (90 Base) MCG/ACT inhaler INHALE 1-2 PUFFS BY MOUTH EVERY 4 HOURS AS NEEDED FOR WHEEZING, SHORTNESS OF BREATH 8.5 g 3   budesonide -formoterol  (SYMBICORT ) 160-4.5 MCG/ACT inhaler INHALE TWO PUFFS TWICE DAILY 10.2 g 3   diazepam  (VALIUM ) 5 MG tablet Take 5 mg by mouth 2 (two) times daily as needed.     fluticasone  (FLONASE) 50 MCG/ACT nasal spray INSTILL 2 SPRAYS IN EACH NOSTRIL ONCE DAILY (Patient taking differently: as needed.) 16 g  5   gabapentin  (NEURONTIN ) 600 MG tablet Take 600 mg by mouth daily.      ibuprofen  (ADVIL ) 800 MG tablet Take 1 tablet (800 mg total) by mouth 3 (three) times daily. 21 tablet 0   levothyroxine  (SYNTHROID , LEVOTHROID) 175 MCG tablet TAKE 1 TABLET BY MOUTH DAILY BEFORE BREAKFAST. (Patient taking differently: 137 mcg. TAKE 1 TABLET BY MOUTH DAILY BEFORE BREAKFAST.) 90 tablet 3   metroNIDAZOLE  (FLAGYL ) 500 MG  tablet Take 1 tablet (500 mg total) by mouth 2 (two) times daily. (Patient not taking: Reported on 12/29/2023) 14 tablet 0   nitrofurantoin, macrocrystal-monohydrate, (MACROBID) 100 MG capsule Take 100 mg by mouth at bedtime. (Patient not taking: Reported on 12/29/2023)     omeprazole  (PRILOSEC) 40 MG capsule Take 1 capsule (40 mg total) by mouth daily. 1 tablet daily before dinner 30 capsule 3   sertraline  (ZOLOFT ) 100 MG tablet Take 100 mg by mouth daily. (Patient not taking: Reported on 12/29/2023)     solifenacin (VESICARE) 5 MG tablet Take 5 mg by mouth daily.     Spacer/Aero Chamber Mouthpiece MISC 1 Device as directed by Does not apply route. 1 each 0   tizanidine (ZANAFLEX) 2 MG capsule Take 2 mg by mouth 3 times/day as needed-between meals & bedtime. Reported on 12/11/2015     No current facility-administered medications for this visit.    Allergies as of 12/29/2023 - Review Complete 12/29/2023  Allergen Reaction Noted   Celecoxib Nausea And Vomiting 03/15/2015   Codeine Itching 08/27/2021   Nsaids Other (See Comments) 03/15/2015   Other Other (See Comments) 05/23/2020    Family History  Problem Relation Age of Onset   Pneumonia Mother        biological mother deceased at age 9   Depression Mother    Bone cancer Father    Asthma Father    Diabetes Father    Alcohol abuse Father    Cancer Father    COPD Father    Heart disease Father    Hypertension Father    Hyperlipidemia Father    Liver disease Father    Alcohol abuse Sister    Alcohol abuse Sister    Alcohol abuse Brother    Alcohol abuse Brother    Colon cancer Neg Hx    Esophageal cancer Neg Hx     Social History   Socioeconomic History   Marital status: Significant Other    Spouse name: Dianna   Number of children: Not on file   Years of education: 14   Highest education level: Not on file  Occupational History   Occupation: Disabled    Comment: Retail Banker  Tobacco Use   Smoking status: Former     Current packs/day: 0.00    Average packs/day: 1 pack/day for 36.4 years (36.4 ttl pk-yrs)    Types: E-cigarettes, Cigarettes    Start date: 04/16/1971    Quit date: 09/14/2007    Years since quitting: 16.3   Smokeless tobacco: Former    Types: Snuff, Chew    Quit date: 09/14/2007  Vaping Use   Vaping status: Former   Quit date: 07/26/2017  Substance and Sexual Activity   Alcohol use: No    Alcohol/week: 0.0 standard drinks of alcohol    Comment: Quit 11/11/1989   Drug use: No    Types: Crack cocaine, Marijuana    Comment: Quit in 1991   Sexual activity: Yes    Birth control/protection: Post-menopausal  Other Topics Concern  Not on file  Social History Narrative   Originally from [place], [place] . Previously has lived in [address] , [place] , [place], [place], [place], [place], [place], & [place] She moved to [place] in 1995. 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 Currently has dogs and a cat and chickens . She has an outdoor hot tub that they use regularly. Previously had mold & mildew in their home for approximately 1 year.    Lives with Dianna   [place]   Social Drivers of Health   Financial Resource Strain: Not on file  Food Insecurity: Not on file  Transportation Needs: Not on file  Physical Activity: Not on file  Stress: Not on file  Social Connections: Not on file  Intimate Partner Violence: Not on file    Review of Systems:    Constitutional: No weight loss, fever, chills, weakness or fatigue HEENT: Eyes: No change in vision               Ears, Nose, Throat:  No change in hearing or congestion Skin: No rash or itching Cardiovascular: No chest pain, chest pressure or palpitations   Respiratory: No SOB or cough Gastrointestinal: See HPI and otherwise negative Genitourinary: No dysuria or change in urinary frequency Neurological: No headache, dizziness or syncope Musculoskeletal: No new muscle or joint  pain Hematologic: No bleeding or bruising Psychiatric: No history of depression or anxiety    Physical Exam:  Vital signs: BP 118/80   Pulse 66   Ht 5' 6 (1.676 m)   Wt 213 lb (96.6 kg)   LMP 10/24/2012   BMI 34.38 kg/m   Constitutional: NAD, alert and cooperative Head:  Normocephalic and atraumatic. Eyes:   PEERL, EOMI. No icterus. Conjunctiva pink. Respiratory: Respirations even and unlabored. Lungs clear to auscultation bilaterally.   No wheezes, crackles, or rhonchi.  Cardiovascular:  Regular rate and rhythm. No peripheral edema, cyanosis or pallor.  Gastrointestinal:  Soft, nondistended, nontender. No rebound or guarding. Normal bowel sounds. No appreciable masses or hepatomegaly. Rectal:  Declines Msk:  Symmetrical without gross deformities. Without edema, no deformity or joint abnormality.  Neurologic:  Alert and  oriented x4;  grossly normal neurologically.  Skin:   Dry and intact without significant lesions or rashes. Psychiatric: Oriented to person, place and time. Demonstrates good judgement and reason without abnormal affect or behaviors.   RELEVANT LABS AND IMAGING: CBC    Component Value Date/Time   WBC 9.0 01/15/2020 1243   RBC 4.50 01/15/2020 1243   HGB 14.0 01/15/2020 1243   HCT 41.3 01/15/2020 1243   PLT 257.0 01/15/2020 1243   MCV 91.7 01/15/2020 1243   MCH 30.6 07/25/2017 0835   MCHC 33.9 01/15/2020 1243   RDW 12.9 01/15/2020 1243   LYMPHSABS 2.8 01/15/2020 1243   MONOABS 0.4 01/15/2020 1243   EOSABS 0.3 01/15/2020 1243   BASOSABS 0.1 01/15/2020 1243    CMP     Component Value Date/Time   NA 141 07/25/2017 0835   K 4.4 07/25/2017 0835   CL 104 07/25/2017 0835   CO2 31 07/25/2017 0835   GLUCOSE 125 (H) 07/25/2017 0835   BUN 19 07/25/2017 0835   CREATININE 0.70 07/25/2017 0835   CALCIUM 9.2 07/25/2017 0835   PROT 7.2 07/25/2017 0835   ALBUMIN 4.2 11/30/2016 1107   AST 18 07/25/2017 0835   ALT 23 07/25/2017 0835   ALKPHOS 94 11/30/2016  1107  BILITOT 0.5 07/25/2017 0835   GFRNONAA 97 07/25/2017 0835   GFRAA 112 07/25/2017 0835     Assessment/Plan:   Assessment & Plan  Change in bowel habits Lower abdominal pain Change in bowel habits over the last 3 months characterized by occasional loose stools with urgency as well as occasional formed stools with associated lower abdominal pain that improves with defecation.  No weight loss, melena, hematochezia.  Last colonoscopy in 2014.  Reports normal labs through PCP (we will obtain).  Patient requesting SIBO breath test.  Less likely to be SIBO but we can test for patient reassurance. -- Initiate Benefiber or Citrucel once or twice daily. - SIBO breath test - Increase water, increase exercise, increase fiber - Further evaluate with colonoscopy  Dysphagia GERD History of recurrent GERD with associated dysphagia to solids.  Dysphagia possibly complicated by dental issues (loss of teeth) but cannot rule out stricture versus globus sensation.  Previous endoscopy sometime around 2008.  Not currently on any antacid. -EGD with possible dilation for further evaluation - I thoroughly discussed the procedure with the patient (at bedside) to include nature of the procedure, alternatives, benefits, and risks (including but not limited to bleeding, infection, perforation, anesthesia/cardiac pulmonary complications).  Patient verbalized understanding and gave verbal consent to proceed with procedure. - Omeprazole  40 Mg once daily - Educated patient on lifestyle modifications and provided patient education handout.  Colon cancer screening Due for repeat colonoscopy.  Last colonoscopy in 2014 was normal.  No family history. - Schedule colonoscopy - I thoroughly discussed the procedure with the patient (at bedside) to include nature of the procedure, alternatives, benefits, and risks (including but not limited to bleeding, infection, perforation, anesthesia/cardiac pulmonary complications).   Patient verbalized understanding and gave verbal consent to proceed with procedure.  Assigned to Dr. Abran today based on procedure schedule   Jarika Robben Mollie RIGGERS [facility] 12/29/2023, 11:43 AM  Cc: Rolinda Millman, MD "

## 2023-12-29 ENCOUNTER — Telehealth: Payer: Self-pay

## 2023-12-29 ENCOUNTER — Ambulatory Visit: Admitting: Gastroenterology

## 2023-12-29 ENCOUNTER — Encounter: Payer: Self-pay | Admitting: Gastroenterology

## 2023-12-29 VITALS — BP 118/80 | HR 66 | Ht 66.0 in | Wt 213.0 lb

## 2023-12-29 DIAGNOSIS — R1030 Lower abdominal pain, unspecified: Secondary | ICD-10-CM

## 2023-12-29 DIAGNOSIS — R1310 Dysphagia, unspecified: Secondary | ICD-10-CM

## 2023-12-29 DIAGNOSIS — R194 Change in bowel habit: Secondary | ICD-10-CM

## 2023-12-29 DIAGNOSIS — K219 Gastro-esophageal reflux disease without esophagitis: Secondary | ICD-10-CM | POA: Diagnosis not present

## 2023-12-29 DIAGNOSIS — Z1211 Encounter for screening for malignant neoplasm of colon: Secondary | ICD-10-CM

## 2023-12-29 MED ORDER — OMEPRAZOLE 40 MG PO CPDR: 40.0000 mg | DELAYED_RELEASE_CAPSULE | Freq: Every day | ORAL | 3 refills | Status: DC

## 2023-12-29 MED ORDER — NA SULFATE-K SULFATE-MG SULF 17.5-3.13-1.6 GM/177ML PO SOLN: 1.0000 | Freq: Once | ORAL | 0 refills | Status: AC

## 2023-12-29 NOTE — Telephone Encounter (Signed)
 Patient declined to do SIBO breath test

## 2023-12-29 NOTE — Patient Instructions (Addendum)
 _______________________________________________________  If your blood pressure at your visit was 140/90 or greater, please contact your primary care physician to follow up on this.  _______________________________________________________  If you are age 63 or older, your body mass index should be between 23-30. Your Body mass index is 34.38 kg/m. If this is out of the aforementioned range listed, please consider follow up with your Primary Care Provider.  If you are age 15 or younger, your body mass index should be between 19-25. Your Body mass index is 34.38 kg/m. If this is out of the aformentioned range listed, please consider follow up with your Primary Care Provider.   ________________________________________________________  The [facility] providers would like to encourage you to use MYCHART to communicate with providers for non-urgent requests or questions.  Due to long hold times on the telephone, sending your provider a message by [facility] may be a faster and more efficient way to get a response.  Please allow 48 business hours for a response.  Please remember that this is for non-urgent requests.  _______________________________________________________  A high fiber diet with plenty of fluids (up to 8 glasses of water daily) is suggested to relieve these symptoms.  Benefiber/citracel, 1 tablespoon once or twice daily can be used to keep bowels regular if needed.   We have sent the following medications to your pharmacy for you to pick up at your convenience: Omeprazole 40mg  daily Suprep  You have been given a testing kit to check for small intestine bacterial overgrowth (SIBO) which is completed by a company named [facility]. Make sure to return your test in the mail using the return mailing label given to you along with the kit. The test order, your demographic and insurance information have all already been sent to the company. [facility] will collect an upfront charge of  $99.74 for commercial insurance plans and $209.74 if you are paying cash. Make sure to discuss with [facility] PRIOR to having the test to see if they have gotten information from your insurance company as to how much your testing will cost out of pocket, if any. Please contact [facility] at phone number [phone] to get instructions regarding how to perform the test as our office is unable to give specific testing instructions.  You have been scheduled for a EGD/colonoscopy. Please follow written instructions given to you at your visit today.   If you use inhalers (even only as needed), please bring them with you on the day of your procedure.  DO NOT TAKE 7 DAYS PRIOR TO TEST- Trulicity (dulaglutide) Ozempic, Wegovy (semaglutide) Mounjaro (tirzepatide) Bydureon Bcise (exanatide extended release)  DO NOT TAKE 1 DAY PRIOR TO YOUR TEST Rybelsus (semaglutide) Adlyxin (lixisenatide) Victoza (liraglutide) Byetta (exanatide) ___________________________________________________________________________   Thank you for trusting me with your gastrointestinal care!   Suzanna Erp, PA

## 2023-12-30 NOTE — Progress Notes (Signed)
 Noted.

## 2024-01-13 DIAGNOSIS — E039 Hypothyroidism, unspecified: Secondary | ICD-10-CM | POA: Diagnosis not present

## 2024-01-13 DIAGNOSIS — J4540 Moderate persistent asthma, uncomplicated: Secondary | ICD-10-CM | POA: Diagnosis not present

## 2024-01-13 DIAGNOSIS — F39 Unspecified mood [affective] disorder: Secondary | ICD-10-CM | POA: Diagnosis not present

## 2024-01-13 DIAGNOSIS — F1911 Other psychoactive substance abuse, in remission: Secondary | ICD-10-CM | POA: Diagnosis not present

## 2024-01-13 DIAGNOSIS — E7800 Pure hypercholesterolemia, unspecified: Secondary | ICD-10-CM | POA: Diagnosis not present

## 2024-01-13 DIAGNOSIS — G894 Chronic pain syndrome: Secondary | ICD-10-CM | POA: Diagnosis not present

## 2024-01-27 DIAGNOSIS — H524 Presbyopia: Secondary | ICD-10-CM | POA: Diagnosis not present

## 2024-01-27 DIAGNOSIS — H52223 Regular astigmatism, bilateral: Secondary | ICD-10-CM | POA: Diagnosis not present

## 2024-01-27 DIAGNOSIS — N3946 Mixed incontinence: Secondary | ICD-10-CM | POA: Diagnosis not present

## 2024-01-27 DIAGNOSIS — H5203 Hypermetropia, bilateral: Secondary | ICD-10-CM | POA: Diagnosis not present

## 2024-01-27 DIAGNOSIS — R350 Frequency of micturition: Secondary | ICD-10-CM | POA: Diagnosis not present

## 2024-02-14 ENCOUNTER — Encounter: Payer: Self-pay | Admitting: Internal Medicine

## 2024-02-14 ENCOUNTER — Ambulatory Visit: Admitting: Internal Medicine

## 2024-02-14 VITALS — BP 130/65 | HR 59 | Temp 97.5°F | Resp 17 | Ht 66.0 in | Wt 213.0 lb

## 2024-02-14 DIAGNOSIS — K5730 Diverticulosis of large intestine without perforation or abscess without bleeding: Secondary | ICD-10-CM

## 2024-02-14 DIAGNOSIS — R194 Change in bowel habit: Secondary | ICD-10-CM | POA: Diagnosis not present

## 2024-02-14 DIAGNOSIS — E039 Hypothyroidism, unspecified: Secondary | ICD-10-CM | POA: Diagnosis not present

## 2024-02-14 DIAGNOSIS — F32A Depression, unspecified: Secondary | ICD-10-CM | POA: Diagnosis not present

## 2024-02-14 DIAGNOSIS — E785 Hyperlipidemia, unspecified: Secondary | ICD-10-CM | POA: Diagnosis not present

## 2024-02-14 DIAGNOSIS — K219 Gastro-esophageal reflux disease without esophagitis: Secondary | ICD-10-CM

## 2024-02-14 DIAGNOSIS — R197 Diarrhea, unspecified: Secondary | ICD-10-CM

## 2024-02-14 DIAGNOSIS — R1310 Dysphagia, unspecified: Secondary | ICD-10-CM | POA: Diagnosis not present

## 2024-02-14 DIAGNOSIS — F419 Anxiety disorder, unspecified: Secondary | ICD-10-CM | POA: Diagnosis not present

## 2024-02-14 DIAGNOSIS — R1030 Lower abdominal pain, unspecified: Secondary | ICD-10-CM

## 2024-02-14 DIAGNOSIS — Z1211 Encounter for screening for malignant neoplasm of colon: Secondary | ICD-10-CM | POA: Diagnosis not present

## 2024-02-14 DIAGNOSIS — K209 Esophagitis, unspecified without bleeding: Secondary | ICD-10-CM

## 2024-02-14 DIAGNOSIS — K449 Diaphragmatic hernia without obstruction or gangrene: Secondary | ICD-10-CM

## 2024-02-14 DIAGNOSIS — M797 Fibromyalgia: Secondary | ICD-10-CM | POA: Diagnosis not present

## 2024-02-14 MED ORDER — OMEPRAZOLE 40 MG PO CPDR: 40.0000 mg | DELAYED_RELEASE_CAPSULE | Freq: Two times a day (BID) | ORAL | 11 refills | Status: AC

## 2024-02-14 MED ORDER — SODIUM CHLORIDE 0.9 % IV SOLN: 500.0000 mL | Freq: Once | INTRAVENOUS | Status: DC

## 2024-02-14 NOTE — Progress Notes (Signed)
 Expand All Collapse All    Chief Complaint: Change in bowels Primary GI MD: Dr. Sandrea Cruel   HPI: Discussed the use of AI scribe software for clinical note transcription with the patient, who gave verbal consent to proceed.   History of Present Illness [name] [name] is a 63 year old female who presents with gastrointestinal issues, including diarrhea and swallowing difficulties.   For the past three months, she has experienced gastrointestinal symptoms resembling irritable bowel syndrome (IBS), characterized by loose stools and a sense of urgency leading to explosive diarrhea, particularly after breakfast. The initial bowel movement of the day may be more solid, but subsequent ones are typically diarrhea. These symptoms occur almost daily and are accompanied by lower abdominal pain which is relieved with defecation..   She has a history of reflux issues and previously underwent an endoscopy (possibly 2008?), after which she was advised to take omeprazole . While this initially improved her symptoms, she has not been taking it regularly and is now experiencing increased reflux and difficulty swallowing, particularly with solids. She attributes some of the swallowing difficulties to dental issues, as she has lost teeth, making chewing harder.   She underwent a colonoscopy in March 2014 and is due for another. There is no family history of colon cancer. She has not been using fiber supplements like Benefiber or Citrucel to manage her bowel habits.   Her friend, Jolie Neat, had similar symptoms and underwent a breath test for SIBO which was negative and patient would like to have the same breath test.     PREVIOUS GI WORKUP    Colonoscopy 11/2012 Normal colonoscopy Repeat 10 years       Past Medical History:  Diagnosis Date   Allergy     Anxiety     Arthritis      low back   Asthma     Carpal tunnel syndrome 01/13/2015    Bilateral   Depression     FIBROIDS, UTERUS 10/17/2006     Qualifier: Diagnosis of  By: Nana Avers MD, Cornelius     Fibromyalgia     HLD (hyperlipidemia)     Hyperlipidemia     Lumbar degenerative disc disease     Nerve damage      right leg   Neuromuscular disorder (HCC)      fibromyalgia   Substance abuse (HCC)      recovered alcoholic   Thyroid  disease      hypo thyroid                Past Surgical History:  Procedure Laterality Date   ANTERIOR CERVICAL DECOMP/DISCECTOMY FUSION        L4, L5    SHOULDER ARTHROSCOPY WITH ROTATOR CUFF REPAIR AND SUBACROMIAL DECOMPRESSION       TONSILLECTOMY       VIDEO ASSISTED THORACOSCOPY (VATS)/DECORTICATION Right 09/24/2015    Procedure: VIDEO ASSISTED THORACOSCOPY (VATS)/DECORTICATION;  Surgeon: Zelphia Higashi, MD;  Location: [facility] OR;  Service: Thoracic;  Laterality: Right;   VIDEO BRONCHOSCOPY N/A 09/24/2015    Procedure: VIDEO BRONCHOSCOPY;  Surgeon: Zelphia Higashi, MD;  Location: [facility] OR;  Service: Thoracic;  Laterality: N/A;                Current Outpatient Medications  Medication Sig Dispense Refill   albuterol  (PROVENTIL ) (2.5 MG/3ML) 0.083% nebulizer solution INHALE THE CONTENTS OF ONE VIAL EVERY 4 HOURS AS NEEDED 90 mL 0   albuterol  (VENTOLIN  HFA) 108 (90 Base) MCG/ACT inhaler INHALE 1-2 PUFFS BY MOUTH  EVERY 4 HOURS AS NEEDED FOR WHEEZING, SHORTNESS OF BREATH 8.5 g 3   budesonide -formoterol  (SYMBICORT ) 160-4.5 MCG/ACT inhaler INHALE TWO PUFFS TWICE DAILY 10.2 g 3   diazepam  (VALIUM ) 5 MG tablet Take 5 mg by mouth 2 (two) times daily as needed.       fluticasone  (FLONASE) 50 MCG/ACT nasal spray INSTILL 2 SPRAYS IN EACH NOSTRIL ONCE DAILY (Patient taking differently: as needed.) 16 g 5   gabapentin  (NEURONTIN ) 600 MG tablet Take 600 mg by mouth daily.        ibuprofen  (ADVIL ) 800 MG tablet Take 1 tablet (800 mg total) by mouth 3 (three) times daily. 21 tablet 0   levothyroxine  (SYNTHROID , LEVOTHROID) 175 MCG tablet TAKE 1 TABLET BY MOUTH DAILY BEFORE BREAKFAST. (Patient taking  differently: 137 mcg. TAKE 1 TABLET BY MOUTH DAILY BEFORE BREAKFAST.) 90 tablet 3   metroNIDAZOLE  (FLAGYL ) 500 MG tablet Take 1 tablet (500 mg total) by mouth 2 (two) times daily. (Patient not taking: Reported on 12/29/2023) 14 tablet 0   nitrofurantoin, macrocrystal-monohydrate, (MACROBID) 100 MG capsule Take 100 mg by mouth at bedtime. (Patient not taking: Reported on 12/29/2023)       omeprazole  (PRILOSEC) 40 MG capsule Take 1 capsule (40 mg total) by mouth daily. 1 tablet daily before dinner 30 capsule 3   sertraline  (ZOLOFT ) 100 MG tablet Take 100 mg by mouth daily. (Patient not taking: Reported on 12/29/2023)       solifenacin (VESICARE) 5 MG tablet Take 5 mg by mouth daily.       Spacer/Aero Chamber Mouthpiece MISC 1 Device as directed by Does not apply route. 1 each 0   tizanidine (ZANAFLEX) 2 MG capsule Take 2 mg by mouth 3 times/day as needed-between meals & bedtime. Reported on 12/11/2015          No current facility-administered medications for this visit.             Allergies as of 12/29/2023 - Review Complete 12/29/2023  Allergen Reaction Noted   Celecoxib Nausea And Vomiting 03/15/2015   Codeine Itching 08/27/2021   Nsaids Other (See Comments) 03/15/2015   Other Other (See Comments) 05/23/2020           Family History  Problem Relation Age of Onset   Pneumonia Mother          biological mother deceased at age 32   Depression Mother     Bone cancer Father     Asthma Father     Diabetes Father     Alcohol abuse Father     Cancer Father     COPD Father     Heart disease Father     Hypertension Father     Hyperlipidemia Father     Liver disease Father     Alcohol abuse Sister     Alcohol abuse Sister     Alcohol abuse Brother     Alcohol abuse Brother     Colon cancer Neg Hx     Esophageal cancer Neg Hx            Social History         Socioeconomic History   Marital status: Significant Other      Spouse name: Dianna   Number of children: Not on file    Years of education: 14   Highest education level: Not on file  Occupational History   Occupation: Disabled      Comment: Retail banker  Tobacco Use   Smoking  status: Former      Current packs/day: 0.00      Average packs/day: 1 pack/day for 36.4 years (36.4 ttl pk-yrs)      Types: E-cigarettes, Cigarettes      Start date: 04/16/1971      Quit date: 09/14/2007      Years since quitting: 16.3   Smokeless tobacco: Former      Types: Snuff, Cassie Click      Quit date: 09/14/2007  Vaping Use   Vaping status: Former   Quit date: 07/26/2017  Substance and Sexual Activity   Alcohol use: No      Alcohol/week: 0.0 standard drinks of alcohol      Comment: Quit 11/11/1989   Drug use: No      Types: "Crack" cocaine, Marijuana      Comment: Quit in 1991   Sexual activity: Yes      Birth control/protection: Post-menopausal  Other Topics Concern   Not on file  Social History Narrative    Originally from [place], [place] . Previously has lived in [place] , [place] , [place], [place], [place], [place], [place], & [place]. She moved to [place] in 1995. 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 Currently has dogs and a cat and chickens . She has an outdoor hot tub that they use regularly. Previously had mold & mildew in their home for approximately 1 year.     Lives with Dianna    [place]    Social Drivers of Health    Financial Resource Strain: Not on file  Food Insecurity: Not on file  Transportation Needs: Not on file  Physical Activity: Not on file  Stress: Not on file  Social Connections: Not on file  Intimate Partner Violence: Not on file      Review of Systems:    Constitutional: No weight loss, fever, chills, weakness or fatigue HEENT: Eyes: No change in vision               Ears, Nose, Throat:  No change in hearing or congestion Skin: No rash or itching Cardiovascular: No chest pain, chest pressure or palpitations   Respiratory: No SOB or  cough Gastrointestinal: See HPI and otherwise negative Genitourinary: No dysuria or change in urinary frequency Neurological: No headache, dizziness or syncope Musculoskeletal: No new muscle or joint pain Hematologic: No bleeding or bruising Psychiatric: No history of depression or anxiety      Physical Exam:  Vital signs: BP 118/80   Pulse 66   Ht 5\' 6"  (1.676 m)   Wt 213 lb (96.6 kg)   LMP 10/24/2012   BMI 34.38 kg/m    Constitutional: NAD, alert and cooperative Head:  Normocephalic and atraumatic. Eyes:   PEERL, EOMI. No icterus. Conjunctiva pink. Respiratory: Respirations even and unlabored. Lungs clear to auscultation bilaterally.   No wheezes, crackles, or rhonchi.  Cardiovascular:  Regular rate and rhythm. No peripheral edema, cyanosis or pallor.  Gastrointestinal:  Soft, nondistended, nontender. No rebound or guarding. Normal bowel sounds. No appreciable masses or hepatomegaly. Rectal:  Declines Msk:  Symmetrical without gross deformities. Without edema, no deformity or joint abnormality.  Neurologic:  Alert and  oriented x4;  grossly normal neurologically.  Skin:   Dry and intact without significant lesions or rashes. Psychiatric: Oriented to person, place and time. Demonstrates good judgement and reason without abnormal affect or behaviors.     RELEVANT LABS AND IMAGING: CBC Labs (Brief)  Component Value Date/Time    WBC 9.0 01/15/2020 1243    RBC 4.50 01/15/2020 1243    HGB 14.0 01/15/2020 1243    HCT 41.3 01/15/2020 1243    PLT 257.0 01/15/2020 1243    MCV 91.7 01/15/2020 1243    MCH 30.6 07/25/2017 0835    MCHC 33.9 01/15/2020 1243    RDW 12.9 01/15/2020 1243    LYMPHSABS 2.8 01/15/2020 1243    MONOABS 0.4 01/15/2020 1243    EOSABS 0.3 01/15/2020 1243    BASOSABS 0.1 01/15/2020 1243        CMP     Labs (Brief)          Component Value Date/Time    NA 141 07/25/2017 0835    K 4.4 07/25/2017 0835    CL 104 07/25/2017 0835    CO2 31  07/25/2017 0835    GLUCOSE 125 (H) 07/25/2017 0835    BUN 19 07/25/2017 0835    CREATININE 0.70 07/25/2017 0835    CALCIUM 9.2 07/25/2017 0835    PROT 7.2 07/25/2017 0835    ALBUMIN 4.2 11/30/2016 1107    AST 18 07/25/2017 0835    ALT 23 07/25/2017 0835    ALKPHOS 94 11/30/2016 1107    BILITOT 0.5 07/25/2017 0835    GFRNONAA 97 07/25/2017 0835    GFRAA 112 07/25/2017 0835          Assessment/Plan:    Assessment & Plan   Change in bowel habits Lower abdominal pain Change in bowel habits over the last 3 months characterized by occasional loose stools with urgency as well as occasional formed stools with associated lower abdominal pain that improves with defecation.  No weight loss, melena, hematochezia.  Last colonoscopy in 2014.  Reports normal labs through PCP (we will obtain).  Patient requesting SIBO breath test.  Less likely to be SIBO but we can test for patient reassurance. -- Initiate Benefiber or Citrucel once or twice daily. - SIBO breath test - Increase water, increase exercise, increase fiber - Further evaluate with colonoscopy   Dysphagia GERD History of recurrent GERD with associated dysphagia to solids.  Dysphagia possibly complicated by dental issues (loss of teeth) but cannot rule out stricture versus globus sensation.  Previous endoscopy sometime around 2008.  Not currently on any antacid. -EGD with possible dilation for further evaluation - I thoroughly discussed the procedure with the patient (at bedside) to include nature of the procedure, alternatives, benefits, and risks (including but not limited to bleeding, infection, perforation, anesthesia/cardiac pulmonary complications).  Patient verbalized understanding and gave verbal consent to proceed with procedure. - Omeprazole  40 Mg once daily - Educated patient on lifestyle modifications and provided patient education handout.   Colon cancer screening Due for repeat colonoscopy.  Last colonoscopy in 2014 was  normal.  No family history. - Schedule colonoscopy - I thoroughly discussed the procedure with the patient (at bedside) to include nature of the procedure, alternatives, benefits, and risks (including but not limited to bleeding, infection, perforation, anesthesia/cardiac pulmonary complications).  Patient verbalized understanding and gave verbal consent to proceed with procedure.   Assigned to Dr. Elvin Hammer today based on procedure schedule    [facility] Lor[facility] 12/29/2023, 11:43 AM   Cc: Dorena Gander, MD  HPI as above.  No interval change.  Now for endoscopy with possible dilation and colonoscopy

## 2024-02-14 NOTE — Patient Instructions (Addendum)
 -Handout on diverticulosis provided. -await pathology results. -repeat colonoscopy for surveillance recommended in 10 years -Continue present medications Take your omeprazole  40 mg twice daily. You have several refills.  Take your medication on an empty stomach or else it won't work.  YOU HAD AN ENDOSCOPIC PROCEDURE TODAY AT THE [facility]:   Refer to the procedure report that was given to you for any specific questions about what was found during the examination.  If the procedure report does not answer your questions, please call your gastroenterologist to clarify.  If you requested that your care partner not be given the details of your procedure findings, then the procedure report has been included in a sealed envelope for you to review at your convenience later.  YOU SHOULD EXPECT: Some feelings of bloating in the abdomen. Passage of more gas than usual.  Walking can help get rid of the air that was put into your GI tract during the procedure and reduce the bloating. If you had a lower endoscopy (such as a colonoscopy or flexible sigmoidoscopy) you may notice spotting of blood in your stool or on the toilet paper. If you underwent a bowel prep for your procedure, you may not have a normal bowel movement for a few days.  Please Note:  You might notice some irritation and congestion in your nose or some drainage.  This is from the oxygen used during your procedure.  There is no need for concern and it should clear up in a day or so.  SYMPTOMS TO REPORT IMMEDIATELY:  Following lower endoscopy (colonoscopy or flexible sigmoidoscopy):  Excessive amounts of blood in the stool  Significant tenderness or worsening of abdominal pains  Swelling of the abdomen that is new, acute  Fever of 100F or higher  Following upper endoscopy (EGD)  Vomiting of blood or coffee ground material  New chest pain or pain under the shoulder blades  Painful or persistently difficult swallowing  New  shortness of breath  Fever of 100F or higher  Black, tarry-looking stools  For urgent or emergent issues, a gastroenterologist can be reached at any hour by calling [phone]. Do not use MyChart messaging for urgent concerns.    DIET:  We do recommend a small meal at first, but then you may proceed to your regular diet.  Drink plenty of fluids but you should avoid alcoholic beverages for 24 hours.  ACTIVITY:  You should plan to take it easy for the rest of today and you should NOT DRIVE or use heavy machinery until tomorrow (because of the sedation medicines used during the test).    FOLLOW UP: Our staff will call the number listed on your records the next business day following your procedure.  We will call around 7:15- 8:00 am to check on you and address any questions or concerns that you may have regarding the information given to you following your procedure. If we do not reach you, we will leave a message.     If any biopsies were taken you will be contacted by phone or by letter within the next 1-3 weeks.  Please call us  at [phone] if you have not heard about the biopsies in 3 weeks.    SIGNATURES/CONFIDENTIALITY: You and/or your care partner have signed paperwork which will be entered into your electronic medical record.  These signatures attest to the fact that that the information above on your After Visit Summary has been reviewed and is understood.  Full responsibility of  the confidentiality of this discharge information lies with you and/or your care-partner.

## 2024-02-14 NOTE — Op Note (Signed)
 [facility] Patient Name: [name] [name] Procedure Date: 02/14/2024 11:28 AM MRN: [id number] Endoscopist: Murel Arlington. Elvin Hammer , MD, [id number] Age: 63 Referring MD:  Date of Birth: [birth year] Gender: Female Account #: [id number] Procedure:                Upper GI endoscopy with balloon dilation of the                            esophagus?"20 mm. With biopsies Indications:              Dysphagia, Esophageal reflux Medicines:                Monitored Anesthesia Care Procedure:                Pre-Anesthesia Assessment:                           - Prior to the procedure, a History and Physical                            was performed, and patient medications and                            allergies were reviewed. The patient's tolerance of                            previous anesthesia was also reviewed. The risks                            and benefits of the procedure and the sedation                            options and risks were discussed with the patient.                            All questions were answered, and informed consent                            was obtained. Prior Anticoagulants: The patient has                            taken no anticoagulant or antiplatelet agents. ASA                            Grade Assessment: II - A patient with mild systemic                            disease. After reviewing the risks and benefits,                            the patient was deemed in satisfactory condition to                            undergo the procedure.  After obtaining informed consent, the endoscope was                            passed under direct vision. Throughout the                            procedure, the patient's blood pressure, pulse, and                            oxygen saturations were monitored continuously. The                            GIF [id number] #[id number] was introduced through the                            mouth, and  advanced to the second part of duodenum.                            The upper GI endoscopy was accomplished without                            difficulty. The patient tolerated the procedure                            well. Scope In: Scope Out: Findings:                 The esophagus was revealed mild edema at the                            gastroesophageal junction, but was otherwise                            normal. After completing the endoscopic survey,                            empiric dilation was performed A TTS dilator was                            passed through the scope. Dilation with an 18-19-20                            mm balloon dilator was performed to 20 mm. No                            resistance. No heme                           The stomach revealed a small hiatal hernia but was                            otherwise normal.                           The examined duodenum was normal. Biopsies for  histology were taken with a cold forceps for                            evaluation of celiac disease.                           The cardia and gastric fundus were normal on                            retroflexion. Complications:            No immediate complications. Estimated Blood Loss:     Estimated blood loss: none. Impression:               - Mild esophagitis. Otherwise, normal esophagus.                            Dilated.                           - Normal stomach. Small hiatal hernia.                           - Normal examined duodenum. Biopsied. Recommendation:           - Patient has a contact number available for                            emergencies. The signs and symptoms of potential                            delayed complications were discussed with the                            patient. Return to normal activities tomorrow.                            Written discharge instructions were provided to the                             patient.                           - Resume previous diet.                           - Continue present medications.                           - Await pathology results.                           - Prescribe omeprazole  40 mg p.o. twice daily; #60;                            11 refills. Please take 1 twice daily until your  follow-up                           - Office follow-up with Bayley McMichael in 6 to 8                            weeks Murel Arlington. Elvin Hammer, MD 02/14/2024 12:05:00 PM This report has been signed electronically.

## 2024-02-14 NOTE — Progress Notes (Signed)
 Pt's states no medical or surgical changes since previsit or office visit.

## 2024-02-14 NOTE — Progress Notes (Signed)
 Called to room to assist during endoscopic procedure.  Patient ID and intended procedure confirmed with present staff. Received instructions for my participation in the procedure from the performing physician.

## 2024-02-14 NOTE — Progress Notes (Signed)
 Pt sedate, gd SR's, VSS, report to RN

## 2024-02-14 NOTE — Progress Notes (Signed)
 Patient states that she has a lot of omeprazole , and that she doesn't need refills.

## 2024-02-14 NOTE — Op Note (Signed)
 [facility] Patient Name: [name] [name] Procedure Date: 02/14/2024 11:27 AM MRN: [id number] Endoscopist: Murel Arlington. Elvin Hammer , MD, [id number] Age: 63 Referring MD:  Date of Birth: December 06, 1960 Gender: Female Account #: [id number] Procedure:                Colonoscopy with biopsies Indications:              Screening for colorectal malignant neoplasm,                            Incidental diarrhea noted. Index exam 2014 was                            normal Medicines:                Monitored Anesthesia Care Procedure:                Pre-Anesthesia Assessment:                           - Prior to the procedure, a History and Physical                            was performed, and patient medications and                            allergies were reviewed. The patient's tolerance of                            previous anesthesia was also reviewed. The risks                            and benefits of the procedure and the sedation                            options and risks were discussed with the patient.                            All questions were answered, and informed consent                            was obtained. Prior Anticoagulants: The patient has                            taken no anticoagulant or antiplatelet agents. ASA                            Grade Assessment: II - A patient with mild systemic                            disease. After reviewing the risks and benefits,                            the patient was deemed in satisfactory condition to  undergo the procedure.                           After obtaining informed consent, the colonoscope                            was passed under direct vision. Throughout the                            procedure, the patient's blood pressure, pulse, and                            oxygen saturations were monitored continuously. The                            CF [id number] #[id number] was introduced through  the anus                            and advanced to the the cecum, identified by                            appendiceal orifice and ileocecal valve. The                            ileocecal valve, appendiceal orifice, and rectum                            were photographed. The quality of the bowel                            preparation was excellent. The colonoscopy was                            performed without difficulty. The patient tolerated                            the procedure well. The bowel preparation used was                            SUPREP via split dose instruction. Scope In: 11:33:40 AM Scope Out: 11:47:09 AM Scope Withdrawal Time: 0 hours 9 minutes 52 seconds  Total Procedure Duration: 0 hours 13 minutes 29 seconds  Findings:                 Multiple diverticula were found in the left colon.                           The exam was otherwise without abnormality on                            direct and retroflexion views.                           Biopsies for histology were taken with a cold  forceps from the entire colon for evaluation of                            microscopic colitis. Complications:            No immediate complications. Estimated blood loss:                            None. Estimated Blood Loss:     Estimated blood loss: none. Impression:               - Diverticulosis in the left colon.                           - The examination was otherwise normal on direct                            and retroflexion views.                           - Biopsies were taken with a cold forceps from the                            entire colon for evaluation of microscopic colitis. Recommendation:           - Repeat colonoscopy in 10 years for screening                            purposes.                           - Patient has a contact number available for                            emergencies. The signs and symptoms of potential                             delayed complications were discussed with the                            patient. Return to normal activities tomorrow.                            Written discharge instructions were provided to the                            patient.                           - Resume previous diet.                           - Continue present medications.                           - Await pathology results. Murel Arlington. Elvin Hammer, MD 02/14/2024 11:52:39 AM This report has been signed electronically.

## 2024-02-15 ENCOUNTER — Telehealth: Payer: Self-pay | Admitting: *Deleted

## 2024-02-15 NOTE — Telephone Encounter (Signed)
  Follow up Call-     02/14/2024   10:35 AM  Call back number  Post procedure Call Back phone  # [phone]  Permission to leave phone message Yes     Patient questions:  Do you have a fever, pain , or abdominal swelling? No. Pain Score  0 *  Have you tolerated food without any problems? Yes.    Have you been able to return to your normal activities? Yes.    Do you have any questions about your discharge instructions: Diet   No. Medications  No. Follow up visit  No.  Do you have questions or concerns about your Care? No.  Actions: * If pain score is 4 or above: No action needed, pain <4.

## 2024-02-17 LAB — SURGICAL PATHOLOGY

## 2024-02-18 ENCOUNTER — Ambulatory Visit: Payer: Self-pay | Admitting: Internal Medicine

## 2024-04-06 DIAGNOSIS — M79641 Pain in right hand: Secondary | ICD-10-CM | POA: Diagnosis not present

## 2024-04-06 DIAGNOSIS — M79642 Pain in left hand: Secondary | ICD-10-CM | POA: Diagnosis not present

## 2024-04-06 DIAGNOSIS — M25551 Pain in right hip: Secondary | ICD-10-CM | POA: Diagnosis not present

## 2024-04-19 DIAGNOSIS — E538 Deficiency of other specified B group vitamins: Secondary | ICD-10-CM | POA: Diagnosis not present

## 2024-04-24 DIAGNOSIS — M25551 Pain in right hip: Secondary | ICD-10-CM | POA: Diagnosis not present

## 2024-04-26 DIAGNOSIS — E538 Deficiency of other specified B group vitamins: Secondary | ICD-10-CM | POA: Diagnosis not present

## 2024-04-27 DIAGNOSIS — M25551 Pain in right hip: Secondary | ICD-10-CM | POA: Diagnosis not present

## 2024-05-10 DIAGNOSIS — E538 Deficiency of other specified B group vitamins: Secondary | ICD-10-CM | POA: Diagnosis not present

## 2024-05-16 DIAGNOSIS — L814 Other melanin hyperpigmentation: Secondary | ICD-10-CM | POA: Diagnosis not present

## 2024-05-16 DIAGNOSIS — L309 Dermatitis, unspecified: Secondary | ICD-10-CM | POA: Diagnosis not present

## 2024-05-16 DIAGNOSIS — D485 Neoplasm of uncertain behavior of skin: Secondary | ICD-10-CM | POA: Diagnosis not present

## 2024-05-16 DIAGNOSIS — L570 Actinic keratosis: Secondary | ICD-10-CM | POA: Diagnosis not present

## 2024-05-16 DIAGNOSIS — L281 Prurigo nodularis: Secondary | ICD-10-CM | POA: Diagnosis not present

## 2024-05-17 DIAGNOSIS — E538 Deficiency of other specified B group vitamins: Secondary | ICD-10-CM | POA: Diagnosis not present

## 2024-06-12 DIAGNOSIS — L281 Prurigo nodularis: Secondary | ICD-10-CM | POA: Diagnosis not present

## 2024-06-12 DIAGNOSIS — L821 Other seborrheic keratosis: Secondary | ICD-10-CM | POA: Diagnosis not present

## 2024-06-12 DIAGNOSIS — L723 Sebaceous cyst: Secondary | ICD-10-CM | POA: Diagnosis not present

## 2024-06-20 ENCOUNTER — Encounter: Payer: Self-pay | Admitting: Gastroenterology

## 2024-06-28 DIAGNOSIS — R350 Frequency of micturition: Secondary | ICD-10-CM | POA: Diagnosis not present

## 2024-06-28 DIAGNOSIS — N3946 Mixed incontinence: Secondary | ICD-10-CM | POA: Diagnosis not present

## 2024-06-28 DIAGNOSIS — R351 Nocturia: Secondary | ICD-10-CM | POA: Diagnosis not present

## 2024-07-10 DIAGNOSIS — M25512 Pain in left shoulder: Secondary | ICD-10-CM | POA: Diagnosis not present

## 2024-07-10 DIAGNOSIS — M25511 Pain in right shoulder: Secondary | ICD-10-CM | POA: Diagnosis not present

## 2024-09-03 DIAGNOSIS — M25511 Pain in right shoulder: Secondary | ICD-10-CM | POA: Diagnosis not present
